# Patient Record
Sex: Female | Born: 1950 | Race: White | Hispanic: No | Smoking: Never smoker
Health system: Southern US, Community
[De-identification: ages and names within clinical notes are randomized; demographics above are authoritative.]

## PROBLEM LIST (undated history)

## (undated) DIAGNOSIS — D696 Thrombocytopenia, unspecified: Secondary | ICD-10-CM

## (undated) DIAGNOSIS — M545 Low back pain, unspecified: Secondary | ICD-10-CM

## (undated) DIAGNOSIS — M1990 Unspecified osteoarthritis, unspecified site: Secondary | ICD-10-CM

## (undated) DIAGNOSIS — Z8489 Family history of other specified conditions: Secondary | ICD-10-CM

## (undated) DIAGNOSIS — N6092 Unspecified benign mammary dysplasia of left breast: Secondary | ICD-10-CM

## (undated) DIAGNOSIS — G8929 Other chronic pain: Secondary | ICD-10-CM

## (undated) DIAGNOSIS — I10 Essential (primary) hypertension: Secondary | ICD-10-CM

## (undated) DIAGNOSIS — K219 Gastro-esophageal reflux disease without esophagitis: Secondary | ICD-10-CM

## (undated) DIAGNOSIS — G43909 Migraine, unspecified, not intractable, without status migrainosus: Secondary | ICD-10-CM

## (undated) DIAGNOSIS — E039 Hypothyroidism, unspecified: Secondary | ICD-10-CM

## (undated) DIAGNOSIS — F329 Major depressive disorder, single episode, unspecified: Secondary | ICD-10-CM

## (undated) DIAGNOSIS — E785 Hyperlipidemia, unspecified: Secondary | ICD-10-CM

## (undated) DIAGNOSIS — F32A Depression, unspecified: Secondary | ICD-10-CM

## (undated) DIAGNOSIS — I2510 Atherosclerotic heart disease of native coronary artery without angina pectoris: Secondary | ICD-10-CM

## (undated) DIAGNOSIS — I219 Acute myocardial infarction, unspecified: Secondary | ICD-10-CM

## (undated) DIAGNOSIS — F419 Anxiety disorder, unspecified: Secondary | ICD-10-CM

## (undated) DIAGNOSIS — H3530 Unspecified macular degeneration: Secondary | ICD-10-CM

## (undated) HISTORY — DX: Morbid (severe) obesity due to excess calories: E66.01

## (undated) HISTORY — DX: Atherosclerotic heart disease of native coronary artery without angina pectoris: I25.10

## (undated) HISTORY — PX: JOINT REPLACEMENT: SHX530

## (undated) HISTORY — PX: CHOLECYSTECTOMY OPEN: SUR202

## (undated) HISTORY — DX: Hyperlipidemia, unspecified: E78.5

## (undated) HISTORY — PX: EYE SURGERY: SHX253

## (undated) HISTORY — DX: Essential (primary) hypertension: I10

## (undated) HISTORY — PX: TONSILLECTOMY: SUR1361

## (undated) HISTORY — PX: BACK SURGERY: SHX140

---

## 1972-09-02 HISTORY — PX: GASTRIC RESTRICTION SURGERY: SHX653

## 2004-07-19 DIAGNOSIS — M1990 Unspecified osteoarthritis, unspecified site: Secondary | ICD-10-CM | POA: Insufficient documentation

## 2004-07-19 DIAGNOSIS — Z780 Asymptomatic menopausal state: Secondary | ICD-10-CM | POA: Insufficient documentation

## 2004-07-19 HISTORY — DX: Unspecified osteoarthritis, unspecified site: M19.90

## 2005-09-02 HISTORY — PX: REPLACEMENT TOTAL KNEE: SUR1224

## 2009-11-29 DIAGNOSIS — R7302 Impaired glucose tolerance (oral): Secondary | ICD-10-CM | POA: Insufficient documentation

## 2009-11-29 DIAGNOSIS — D696 Thrombocytopenia, unspecified: Secondary | ICD-10-CM | POA: Insufficient documentation

## 2009-11-29 DIAGNOSIS — I219 Acute myocardial infarction, unspecified: Secondary | ICD-10-CM | POA: Insufficient documentation

## 2009-11-29 HISTORY — DX: Thrombocytopenia, unspecified: D69.6

## 2010-09-02 DIAGNOSIS — I219 Acute myocardial infarction, unspecified: Secondary | ICD-10-CM

## 2010-09-02 DIAGNOSIS — D696 Thrombocytopenia, unspecified: Secondary | ICD-10-CM

## 2010-09-02 HISTORY — DX: Thrombocytopenia, unspecified: D69.6

## 2010-09-02 HISTORY — DX: Acute myocardial infarction, unspecified: I21.9

## 2011-09-03 HISTORY — PX: CORONARY ANGIOPLASTY WITH STENT PLACEMENT: SHX49

## 2011-09-03 HISTORY — PX: CARDIAC CATHETERIZATION: SHX172

## 2011-12-18 DIAGNOSIS — Z9889 Other specified postprocedural states: Secondary | ICD-10-CM | POA: Insufficient documentation

## 2014-11-23 HISTORY — PX: BREAST EXCISIONAL BIOPSY: SUR124

## 2015-04-18 ENCOUNTER — Telehealth: Payer: Self-pay | Admitting: Cardiovascular Disease

## 2015-04-18 NOTE — Telephone Encounter (Signed)
Received records from [facility] for appointment on 05/10/15 with Dr Oval Linsey.  Records given to [facility] (medical records) for Dr Blenda Mounts schedule on 05/10/15. lp

## 2015-04-25 ENCOUNTER — Other Ambulatory Visit: Payer: Self-pay | Admitting: Family Medicine

## 2015-04-25 DIAGNOSIS — E039 Hypothyroidism, unspecified: Secondary | ICD-10-CM

## 2015-04-27 ENCOUNTER — Ambulatory Visit
Admission: RE | Admit: 2015-04-27 | Discharge: 2015-04-27 | Disposition: A | Discharge status: Home / Self Care | Payer: 59 | Source: Ambulatory Visit | Attending: Family Medicine | Admitting: Family Medicine

## 2015-04-27 DIAGNOSIS — E039 Hypothyroidism, unspecified: Secondary | ICD-10-CM

## 2015-05-02 ENCOUNTER — Ambulatory Visit: Payer: Self-pay

## 2015-05-09 ENCOUNTER — Ambulatory Visit: Payer: Self-pay

## 2015-05-09 NOTE — Progress Notes (Signed)
Cardiology Office Note   Date:  05/10/2015   ID:  [name], DOB 06-Sep-1950, MRN [id number]  PCP:  Lilian Coma, MD  Cardiologist:   Sharol Harness, MD   Chief Complaint  Patient presents with  . New Evaluation    hx MI 2010, STENT 2013, [place] FROM [place] REDDNESS IN FEET AND LEGS ,NO SWELLING  . Shortness of Breath    OFF /ON  SINCE JUNE 2016 ,  . Chest Pain    HAS TO USE NTG - RECENTLY   LAST TIME @3  YEARS AGO, DIAPHORESIS, NO RADIATIONS  . Medical Clearance    POSSIBLE BACK SURGERY WITH DR Vertell Limber      History of Present Illness: Dawn Davidson is a 64 y.o. female with CAD s/p MI, hypertension, hyperlipidemia and morbid obesity who presents for an evaluation of chest pain.  [name] recently moved to [place] from [place]. Prior to moving she be and to develop substernal chest pressure and shortness of breath. After her MI and stent placement in 2010 she had not experienced any chest discomfort. She had not needed any nitroglycerin until 3 weeks ago. She's been having episodes once per week that occur either with exertion or at rest. The episode 4 days ago was 10 out of 10 in severity. She had substernal chest discomfort that did not radiate. It was not associated with nausea, vomiting, or diaphoresis. She took a nitroglycerin and this did help her symptoms. She was recently started on Synthroid and wonders if it could be up side effect of this medication.  [name] does not get any exercise. This is due to chronic back pain. She is currently awaiting surgery. She reports difficulty with her weight despite the fact that she does not eat very much. She frequently goes few days without eating. She underwent stomach stapling at the age of 44, but continues to struggle with her weight.    Past Medical History  Diagnosis Date  . Coronary artery disease     2010 MI  . Thyroid disease 2016  . Hypertension   . Hyperlipidemia   . Morbid obesity 05/10/2015     Past Surgical History  Procedure Laterality Date  . Cardiac catheterization  2013  . Coronary angioplasty  2013  . Replacement total knee Right 2007     Current Outpatient Prescriptions  Medication Sig Dispense Refill  . atorvastatin (LIPITOR) 40 MG tablet Take 40 mg by mouth at bedtime.  3  . cholecalciferol (VITAMIN D) 1000 UNITS tablet Take 1,000 Units by mouth daily.    Marland Kitchen gabapentin (NEURONTIN) 100 MG capsule TAKE 3 CAPSULES BY MOUTH IN AM AND 3 CAPSULES AT BEDTIME  0  . HYDROcodone-acetaminophen (NORCO/VICODIN) 5-325 MG per tablet Take 1-2 tablets by mouth every 6 (six) hours as needed for moderate pain.    Marland Kitchen levothyroxine (SYNTHROID, LEVOTHROID) 50 MCG tablet Take 50 mcg by mouth daily.  2  . losartan (COZAAR) 25 MG tablet Take 25 mg by mouth daily.    . metoprolol tartrate (LOPRESSOR) 25 MG tablet Take 25 mg by mouth 2 (two) times daily.     Marland Kitchen NITROSTAT 0.4 MG SL tablet Place 0.4 mg under the tongue every 5 (five) minutes as needed.     . traMADol (ULTRAM) 50 MG tablet Take 50 mg by mouth every 6 (six) hours as needed. for pain  0  . traZODone (DESYREL) 50 MG tablet Take 50 mg by mouth at bedtime.  0  .  venlafaxine (EFFEXOR) 75 MG tablet Take 60 mg by mouth 2 (two) times daily with a meal.      No current facility-administered medications for this visit.    Allergies:   Sulfa antibiotics    Social History:  The patient  reports that she has never smoked. She does not have any smokeless tobacco history on file. She reports that she does not drink alcohol or use illicit drugs.   Family History:  The patient's family history includes Bipolar disorder in her daughter and daughter; Cancer in her brother; Heart disease in her sister; Stomach cancer in her mother; Thyroid disease in her daughter and grandchild.    ROS:  Please see the history of present illness.   Otherwise, review of systems are positive for varicose veins, spider veins, and back pain.   All other systems are  reviewed and negative.    PHYSICAL EXAM: VS:  BP 142/60 mmHg  Pulse 72  Ht 5\' 3"  (1.6 m)  Wt 121.972 kg (268 lb 14.4 oz)  BMI 47.65 kg/m2 , BMI Body mass index is 47.65 kg/(m^2). GENERAL:  Well appearing.  Obese. HEENT:  Pupils equal round and reactive, fundi not visualized, oral mucosa unremarkable NECK:  No jugular venous distention, waveform within normal limits, carotid upstroke brisk and symmetric, no bruits, no thyromegaly LYMPHATICS:  No cervical adenopathy LUNGS:  Clear to auscultation bilaterally HEART:  RRR.  PMI not displaced or sustained,S1 and S2 within normal limits, no S3, no S4, no clicks, no rubs, no murmurs ABD:  Flat, positive bowel sounds normal in frequency in pitch, no bruits, no rebound, no guarding, no midline pulsatile mass, no hepatomegaly, no splenomegaly EXT:  2 plus pulses throughout, no edema, no cyanosis no clubbing SKIN:  No rashes no nodules NEURO:  Cranial nerves II through XII grossly intact, motor grossly intact throughout PSYCH:  Cognitively intact, oriented to person place and time    EKG:  EKG is ordered today. The ekg ordered today demonstrates sinus rhythm at 72 bpm.     Recent Labs: No results found for requested labs within last 365 days.    Lipid Panel No results found for: CHOL, TRIG, HDL, CHOLHDL, VLDL, LDLCALC, LDLDIRECT    Wt Readings from Last 3 Encounters:  05/10/15 121.972 kg (268 lb 14.4 oz)      Other studies Reviewed: Additional studies/ records that were reviewed today include. Medical record review Review of the above records demonstrates:  Please see elsewhere in the note.     ASSESSMENT AND PLAN:  # CAD, chest pain: [name]'s symptoms are concerning for ischemia.  She was instructed to go to the ED if her CP does not respond to sublingual nitroglycerin. - Lexiscan Myoview - Continue aspirin, metoprolol, and atorvastatin   # Hypertension: BP near goal.  SBP is 142 and would like it to be <140 mmHg.  Will  continue to monitor for now. - Continue metoprolol and losartan  # Hyperlipidemia: Patient states that her PCP recently checked her lipids and they were at goal. - Continue atorvastatin  # Pre-surgical assessment: Given Ms. Collecton's chest pain that is concerning for angina and her inability to complete four METs of activity due to back pain, she should undergo an ischemia evaluation prior to undergoing any elective surgery.  # Morbid obesity: Ms. Latterell was encouraged to increase her physical activity.  She is currently limited due to back pain but looks forward to starting to walk after back surgery.  She also  skips meals frequently.  She was reminded that this slows her metabolism.  Instead she should have several small, nutritious meals daily.  Current medicines are reviewed at length with the patient today.  The patient does not have concerns regarding medicines.  The following changes have been made:  no change  Labs/ tests ordered today include:   Orders Placed This Encounter  Procedures  . Myocardial Perfusion Imaging  . EKG 12-Lead     Disposition:   FU with Dr. Jonelle Sidle C. [place] in 6 months.    Signed, Sharol Harness, MD  05/10/2015 8:52 AM    [facility]

## 2015-05-10 ENCOUNTER — Ambulatory Visit (INDEPENDENT_AMBULATORY_CARE_PROVIDER_SITE_OTHER): Payer: 59 | Admitting: Cardiovascular Disease

## 2015-05-10 ENCOUNTER — Encounter: Payer: Self-pay | Admitting: Cardiovascular Disease

## 2015-05-10 VITALS — BP 142/60 | HR 72 | Ht 63.0 in | Wt 268.9 lb

## 2015-05-10 DIAGNOSIS — I25110 Atherosclerotic heart disease of native coronary artery with unstable angina pectoris: Secondary | ICD-10-CM | POA: Diagnosis not present

## 2015-05-10 DIAGNOSIS — R079 Chest pain, unspecified: Secondary | ICD-10-CM | POA: Diagnosis not present

## 2015-05-10 DIAGNOSIS — R0602 Shortness of breath: Secondary | ICD-10-CM

## 2015-05-10 DIAGNOSIS — I200 Unstable angina: Secondary | ICD-10-CM | POA: Diagnosis not present

## 2015-05-10 DIAGNOSIS — Z01810 Encounter for preprocedural cardiovascular examination: Secondary | ICD-10-CM

## 2015-05-10 HISTORY — DX: Morbid (severe) obesity due to excess calories: E66.01

## 2015-05-10 NOTE — Patient Instructions (Signed)
Your physician has requested that you have a lexiscan myoview. For further information please visit [email or web address]. Please follow instruction sheet, as given.  NO OTHER CHANGES TODAY.  Your physician wants you to follow-up in [place]. You will receive a reminder letter in the mail two months in advance. If you don't receive a letter, please call our office to schedule the follow-up appointment.

## 2015-05-16 ENCOUNTER — Ambulatory Visit: Payer: Self-pay

## 2015-05-25 ENCOUNTER — Telehealth (HOSPITAL_COMMUNITY): Payer: Self-pay | Admitting: Radiology

## 2015-05-25 NOTE — Telephone Encounter (Signed)
Encounter complete. 

## 2015-05-30 ENCOUNTER — Ambulatory Visit (HOSPITAL_COMMUNITY)
Admission: RE | Admit: 2015-05-30 | Discharge: 2015-05-30 | Disposition: A | Discharge status: Home / Self Care | Payer: 59 | Source: Ambulatory Visit | Attending: Internal Medicine | Admitting: Internal Medicine

## 2015-05-30 DIAGNOSIS — I25110 Atherosclerotic heart disease of native coronary artery with unstable angina pectoris: Secondary | ICD-10-CM

## 2015-05-30 DIAGNOSIS — Z8249 Family history of ischemic heart disease and other diseases of the circulatory system: Secondary | ICD-10-CM | POA: Diagnosis not present

## 2015-05-30 DIAGNOSIS — E669 Obesity, unspecified: Secondary | ICD-10-CM | POA: Insufficient documentation

## 2015-05-30 DIAGNOSIS — R079 Chest pain, unspecified: Secondary | ICD-10-CM | POA: Diagnosis not present

## 2015-05-30 DIAGNOSIS — R5383 Other fatigue: Secondary | ICD-10-CM | POA: Insufficient documentation

## 2015-05-30 DIAGNOSIS — R0602 Shortness of breath: Secondary | ICD-10-CM | POA: Diagnosis not present

## 2015-05-30 DIAGNOSIS — Z6841 Body Mass Index (BMI) 40.0 and over, adult: Secondary | ICD-10-CM | POA: Diagnosis not present

## 2015-05-30 DIAGNOSIS — I200 Unstable angina: Secondary | ICD-10-CM | POA: Diagnosis not present

## 2015-05-30 DIAGNOSIS — I10 Essential (primary) hypertension: Secondary | ICD-10-CM | POA: Diagnosis not present

## 2015-05-30 DIAGNOSIS — Z01810 Encounter for preprocedural cardiovascular examination: Secondary | ICD-10-CM

## 2015-05-30 DIAGNOSIS — R0609 Other forms of dyspnea: Secondary | ICD-10-CM | POA: Insufficient documentation

## 2015-05-30 MED ORDER — TECHNETIUM TC 99M SESTAMIBI GENERIC - CARDIOLITE
32.0000 | Freq: Once | INTRAVENOUS | Status: AC | PRN
  Administered 2015-05-30: 32 via INTRAVENOUS

## 2015-05-30 MED ORDER — REGADENOSON 0.4 MG/5ML IV SOLN
0.4000 mg | Freq: Once | INTRAVENOUS | Status: AC
  Administered 2015-05-30: 0.4 mg via INTRAVENOUS

## 2015-05-31 ENCOUNTER — Ambulatory Visit (HOSPITAL_COMMUNITY)
Admission: RE | Admit: 2015-05-31 | Discharge: 2015-05-31 | Disposition: A | Discharge status: Home / Self Care | Payer: 59 | Source: Ambulatory Visit | Attending: Urology | Admitting: Urology

## 2015-05-31 LAB — MYOCARDIAL PERFUSION IMAGING
LV dias vol: 91 mL
LV sys vol: 32 mL
Peak HR: 88 {beats}/min
Rest HR: 65 {beats}/min
SDS: 1
SRS: 0
SSS: 1
TID: 0.75

## 2015-05-31 MED ORDER — TECHNETIUM TC 99M SESTAMIBI GENERIC - CARDIOLITE
30.1000 | Freq: Once | INTRAVENOUS | Status: AC | PRN
  Administered 2015-05-31: 30.1 via INTRAVENOUS

## 2015-06-06 ENCOUNTER — Telehealth: Payer: Self-pay | Admitting: Cardiovascular Disease

## 2015-06-06 NOTE — Telephone Encounter (Signed)
Spoke to patient. Result given . Verbalized understanding Routed report to Dr Vertell Limber PATIENT AWARE

## 2015-06-06 NOTE — Telephone Encounter (Signed)
-----   Message from Skeet Latch, MD sent at 06/04/2015  6:58 PM EDT ----- Normal stress test.  She is at acceptable risk to undergo surgery.  Dawn Davidson should be taking aspirin 81mg  daily in addition to her current regimen.

## 2015-06-06 NOTE — Telephone Encounter (Signed)
Dawn Davidson is calling to get the results of her stress test , please call   Thanks

## 2015-06-20 ENCOUNTER — Other Ambulatory Visit: Payer: Self-pay

## 2015-06-20 DIAGNOSIS — Z1231 Encounter for screening mammogram for malignant neoplasm of breast: Secondary | ICD-10-CM

## 2015-07-04 ENCOUNTER — Ambulatory Visit
Admission: RE | Admit: 2015-07-04 | Discharge: 2015-07-04 | Disposition: A | Discharge status: Home / Self Care | Payer: 59 | Source: Ambulatory Visit

## 2015-07-04 DIAGNOSIS — Z1231 Encounter for screening mammogram for malignant neoplasm of breast: Secondary | ICD-10-CM

## 2015-07-13 ENCOUNTER — Other Ambulatory Visit: Payer: Self-pay | Admitting: Family Medicine

## 2015-07-13 DIAGNOSIS — R928 Other abnormal and inconclusive findings on diagnostic imaging of breast: Secondary | ICD-10-CM

## 2015-07-21 ENCOUNTER — Ambulatory Visit
Admission: RE | Admit: 2015-07-21 | Discharge: 2015-07-21 | Disposition: A | Discharge status: Home / Self Care | Payer: 59 | Source: Ambulatory Visit | Attending: Family Medicine | Admitting: Family Medicine

## 2015-07-21 ENCOUNTER — Other Ambulatory Visit: Payer: Self-pay | Admitting: Neurosurgery

## 2015-07-21 ENCOUNTER — Other Ambulatory Visit: Payer: Self-pay | Admitting: Family Medicine

## 2015-07-21 DIAGNOSIS — R928 Other abnormal and inconclusive findings on diagnostic imaging of breast: Secondary | ICD-10-CM

## 2015-08-03 HISTORY — PX: BREAST BIOPSY: SHX20

## 2015-08-09 ENCOUNTER — Other Ambulatory Visit: Payer: Self-pay | Admitting: Family Medicine

## 2015-08-09 DIAGNOSIS — R928 Other abnormal and inconclusive findings on diagnostic imaging of breast: Secondary | ICD-10-CM

## 2015-08-10 ENCOUNTER — Ambulatory Visit
Admission: RE | Admit: 2015-08-10 | Discharge: 2015-08-10 | Disposition: A | Discharge status: Home / Self Care | Payer: 59 | Source: Ambulatory Visit | Attending: Family Medicine | Admitting: Family Medicine

## 2015-08-10 DIAGNOSIS — R928 Other abnormal and inconclusive findings on diagnostic imaging of breast: Secondary | ICD-10-CM

## 2015-08-14 ENCOUNTER — Encounter (HOSPITAL_COMMUNITY)
Admission: RE | Admit: 2015-08-14 | Discharge: 2015-08-14 | Disposition: A | Discharge status: Home / Self Care | Payer: 59 | Source: Ambulatory Visit | Attending: Neurosurgery | Admitting: Neurosurgery

## 2015-08-14 ENCOUNTER — Encounter (HOSPITAL_COMMUNITY): Payer: Self-pay

## 2015-08-14 ENCOUNTER — Ambulatory Visit (HOSPITAL_COMMUNITY)
Admission: RE | Admit: 2015-08-14 | Discharge: 2015-08-14 | Disposition: A | Discharge status: Home / Self Care | Payer: 59 | Source: Ambulatory Visit | Attending: Neurosurgery | Admitting: Neurosurgery

## 2015-08-14 DIAGNOSIS — Z01811 Encounter for preprocedural respiratory examination: Secondary | ICD-10-CM | POA: Diagnosis present

## 2015-08-14 DIAGNOSIS — Z01812 Encounter for preprocedural laboratory examination: Secondary | ICD-10-CM | POA: Diagnosis not present

## 2015-08-14 HISTORY — DX: Unspecified osteoarthritis, unspecified site: M19.90

## 2015-08-14 HISTORY — DX: Hypothyroidism, unspecified: E03.9

## 2015-08-14 HISTORY — DX: Thrombocytopenia, unspecified: D69.6

## 2015-08-14 HISTORY — DX: Gastro-esophageal reflux disease without esophagitis: K21.9

## 2015-08-14 HISTORY — DX: Family history of other specified conditions: Z84.89

## 2015-08-14 LAB — CBC
HCT: 44.9 % (ref 36.0–46.0)
Hemoglobin: 14.5 g/dL (ref 12.0–15.0)
MCH: 27.6 pg (ref 26.0–34.0)
MCHC: 32.3 g/dL (ref 30.0–36.0)
MCV: 85.5 fL (ref 78.0–100.0)
Platelets: 207 10*3/uL (ref 150–400)
RBC: 5.25 MIL/uL — ABNORMAL HIGH (ref 3.87–5.11)
RDW: 13.2 % (ref 11.5–15.5)
WBC: 9.2 10*3/uL (ref 4.0–10.5)

## 2015-08-14 LAB — BASIC METABOLIC PANEL
Anion gap: 8 (ref 5–15)
BUN: 13 mg/dL (ref 6–20)
CO2: 26 mmol/L (ref 22–32)
Calcium: 9.5 mg/dL (ref 8.9–10.3)
Chloride: 104 mmol/L (ref 101–111)
Creatinine, Ser: 0.95 mg/dL (ref 0.44–1.00)
GFR calc Af Amer: >60 mL/min (ref >60)
GFR calc non Af Amer: >60 mL/min (ref >60)
Glucose, Bld: 118 mg/dL — ABNORMAL HIGH (ref 65–99)
Potassium: 3.8 mmol/L (ref 3.5–5.1)
Sodium: 138 mmol/L (ref 135–145)

## 2015-08-14 LAB — SURGICAL PCR SCREEN
MRSA, PCR: NEGATIVE
Staphylococcus aureus: POSITIVE — AB

## 2015-08-14 LAB — ABO/RH: ABO/RH(D): A POS

## 2015-08-14 LAB — TYPE AND SCREEN
ABO/RH(D): A POS
Antibody Screen: NEGATIVE

## 2015-08-14 NOTE — Progress Notes (Addendum)
Has cardiac history - Mi in 2010, Stent in 2013, moved from [place]. She usually saw the NP @ [facility] in [place], [place] for her cardiac problems.   PCP is [facility].   Heart cath in 2013 was in  [place]  Copy of stent card placed inside chart. Now sees Dr. Skeet Latch, [place] was 05/2015 (note inside chart) Currently denies any heart issues, no sob, chest pain. She has had breast biopsies, one was benign, but she has another cyst that "is in the ducts" and is going back to general surgeon after this to get that taken care of.

## 2015-08-14 NOTE — Pre-Procedure Instructions (Signed)
   Dawn Davidson  08/14/2015      [facility] - [place], [place] - [address] [place] [place] [address] Phone: [phone] Fax: [phone]    Your procedure is scheduled on Dec. 16th, Friday.   Report to [facility] Admitting at 7:15 AM  Call this number if you have problems the morning of surgery:  [phone]   Remember:  Do not eat food or drink liquids after midnight Thursday.  Take these medicines the morning of surgery with A SIP OF WATER : Levothyroxine, Metoprolol, Effexor, Pain medication   Do not wear jewelry, make-up or nail polish.  Do not wear lotions, powders, or perfumes.  You may NOT wear deodorant the day of surgery.  Do not shave underarms & legs 48 hours prior to surgery.     Do not bring valuables to the hospital.  [facility] is not responsible for any belongings or valuables.  Contacts, dentures or bridgework may not be worn into surgery.  Leave your suitcase in the car.  After surgery it may be brought to your room. For patients admitted to the hospital, discharge time will be determined by your treatment team.  Name and phone number of your driver:     Please read over the following fact sheets that you were given. Pain Booklet, Coughing and Deep Breathing, Blood Transfusion Information, MRSA Information and Surgical Site Infection Prevention

## 2015-08-14 NOTE — Progress Notes (Signed)
Mupirocin Ointment Rx called into [facility] on [address] for positive PCR of Staph. Pt notified and voiced understanding.  

## 2015-08-14 NOTE — Progress Notes (Signed)
   08/14/15 1313  OBSTRUCTIVE SLEEP APNEA  Have you ever been diagnosed with sleep apnea through a sleep study? No  Do you snore loudly (loud enough to be heard through closed doors)?  0  Do you often feel tired, fatigued, or sleepy during the daytime (such as falling asleep during driving or talking to someone)? 1 ('always tired")  Has anyone observed you stop breathing during your sleep? 0  Do you have, or are you being treated for high blood pressure? 1  BMI more than 35 kg/m2? 1  Age > 50 (1-yes) 1  Neck circumference greater than:Female 16 inches or larger, Female 17inches or larger? 1  Female Gender (Yes=1) 0  Obstructive Sleep Apnea Score 5

## 2015-08-15 NOTE — Progress Notes (Signed)
Anesthesia Chart Review:  Pt is 64 year old female scheduled for L4-5 maximum access PLIF on 08/18/2015 with Dr. Vertell Limber.   Cardiologist is Dr. Skeet Latch.   PMH includes:  CAD (MI 2010, stent in 2013 in [place]), HTN, hyperlipidemia, hypothyroidism, GERD. Never smoker. BMI 46.5  Medications include: ASA, lipitor, levothyroxine, losartan, metoprolol, prilosec.   Preoperative labs reviewed.    Chest x-ray 08/14/15 reviewed. No acute cardiopulmonary abnormality  EKG 05/10/15: sinus rhythm. Borderline low voltage.   Nuclear stress test 05/31/15:   Nuclear stress EF: 65%.  The left ventricular ejection fraction is normal (55-65%).  No T wave inversion was noted during stress.  There was no ST segment deviation noted during stress.  This is a low risk study.  Pt has cardiac clearance from Dr. Oval Linsey for surgery.   Attempting to get copy of cardiac cath from 2013.  Will revisit chart when records arrive.   Willeen Cass, FNP-BC [facility]/Anesthesiology Phone: [phone] 08/15/2015 12:30 PM

## 2015-08-17 NOTE — Progress Notes (Signed)
Spoke with medical records at [facility] and [facility] in [place] and re-requested cardiac cath results.  They had me re-fax the request and mark it urgent which I did.

## 2015-08-17 NOTE — Progress Notes (Signed)
Spoke with Audry Pili in medical records states he doesn't see a request. Faxed request for card cath to [phone].

## 2015-08-18 ENCOUNTER — Encounter (HOSPITAL_COMMUNITY): Payer: Self-pay

## 2015-08-18 ENCOUNTER — Encounter (HOSPITAL_COMMUNITY): Payer: Self-pay | Admitting: General Practice

## 2015-08-18 ENCOUNTER — Inpatient Hospital Stay (HOSPITAL_COMMUNITY)
Admission: AD | Admit: 2015-08-18 | Discharge: 2015-08-19 | DRG: 459 | Disposition: A | Discharge status: Home / Self Care | Payer: 59 | Source: Ambulatory Visit | Attending: Neurosurgery | Admitting: Neurosurgery

## 2015-08-18 ENCOUNTER — Encounter (HOSPITAL_COMMUNITY)
Admission: AD | Disposition: A | Discharge status: Home / Self Care | Payer: Self-pay | Source: Ambulatory Visit | Attending: Neurosurgery

## 2015-08-18 ENCOUNTER — Inpatient Hospital Stay (HOSPITAL_COMMUNITY): Payer: 59

## 2015-08-18 ENCOUNTER — Inpatient Hospital Stay (HOSPITAL_COMMUNITY): Payer: 59 | Admitting: Certified Registered Nurse Anesthetist

## 2015-08-18 ENCOUNTER — Encounter (HOSPITAL_COMMUNITY): Payer: Self-pay | Admitting: *Deleted

## 2015-08-18 ENCOUNTER — Inpatient Hospital Stay (HOSPITAL_COMMUNITY): Payer: 59 | Admitting: Vascular Surgery

## 2015-08-18 DIAGNOSIS — I2510 Atherosclerotic heart disease of native coronary artery without angina pectoris: Secondary | ICD-10-CM | POA: Diagnosis present

## 2015-08-18 DIAGNOSIS — E039 Hypothyroidism, unspecified: Secondary | ICD-10-CM | POA: Diagnosis present

## 2015-08-18 DIAGNOSIS — Z96659 Presence of unspecified artificial knee joint: Secondary | ICD-10-CM | POA: Diagnosis present

## 2015-08-18 DIAGNOSIS — Z419 Encounter for procedure for purposes other than remedying health state, unspecified: Secondary | ICD-10-CM

## 2015-08-18 DIAGNOSIS — M4806 Spinal stenosis, lumbar region: Principal | ICD-10-CM | POA: Diagnosis present

## 2015-08-18 DIAGNOSIS — I10 Essential (primary) hypertension: Secondary | ICD-10-CM | POA: Diagnosis present

## 2015-08-18 DIAGNOSIS — G9519 Other vascular myelopathies: Secondary | ICD-10-CM | POA: Diagnosis present

## 2015-08-18 DIAGNOSIS — Z955 Presence of coronary angioplasty implant and graft: Secondary | ICD-10-CM | POA: Diagnosis not present

## 2015-08-18 DIAGNOSIS — I252 Old myocardial infarction: Secondary | ICD-10-CM | POA: Diagnosis not present

## 2015-08-18 DIAGNOSIS — M48062 Spinal stenosis, lumbar region with neurogenic claudication: Secondary | ICD-10-CM | POA: Diagnosis present

## 2015-08-18 DIAGNOSIS — M5116 Intervertebral disc disorders with radiculopathy, lumbar region: Secondary | ICD-10-CM | POA: Diagnosis present

## 2015-08-18 DIAGNOSIS — Z6841 Body Mass Index (BMI) 40.0 and over, adult: Secondary | ICD-10-CM | POA: Diagnosis not present

## 2015-08-18 DIAGNOSIS — M2578 Osteophyte, vertebrae: Secondary | ICD-10-CM | POA: Diagnosis present

## 2015-08-18 DIAGNOSIS — Z9884 Bariatric surgery status: Secondary | ICD-10-CM | POA: Diagnosis not present

## 2015-08-18 HISTORY — PX: MAXIMUM ACCESS (MAS)POSTERIOR LUMBAR INTERBODY FUSION (PLIF) 1 LEVEL: SHX6368

## 2015-08-18 HISTORY — DX: Low back pain: M54.5

## 2015-08-18 HISTORY — DX: Acute myocardial infarction, unspecified: I21.9

## 2015-08-18 HISTORY — DX: Depression, unspecified: F32.A

## 2015-08-18 HISTORY — DX: Low back pain, unspecified: M54.50

## 2015-08-18 HISTORY — DX: Migraine, unspecified, not intractable, without status migrainosus: G43.909

## 2015-08-18 HISTORY — DX: Other chronic pain: G89.29

## 2015-08-18 HISTORY — DX: Major depressive disorder, single episode, unspecified: F32.9

## 2015-08-18 HISTORY — DX: Anxiety disorder, unspecified: F41.9

## 2015-08-18 SURGERY — FOR MAXIMUM ACCESS (MAS) POSTERIOR LUMBAR INTERBODY FUSION (PLIF) 1 LEVEL
Anesthesia: General | Site: Back

## 2015-08-18 MED ORDER — 0.9 % SODIUM CHLORIDE (POUR BTL) OPTIME
TOPICAL | Status: DC | PRN
  Administered 2015-08-18: 1000 mL

## 2015-08-18 MED ORDER — PROPOFOL 500 MG/50ML IV EMUL
INTRAVENOUS | Status: DC | PRN
  Administered 2015-08-18: 50 ug/kg/min via INTRAVENOUS

## 2015-08-18 MED ORDER — THROMBIN 20000 UNITS EX SOLR
CUTANEOUS | Status: DC | PRN
  Administered 2015-08-18: 12:00:00 via TOPICAL

## 2015-08-18 MED ORDER — KCL IN DEXTROSE-NACL 20-5-0.45 MEQ/L-%-% IV SOLN: INTRAVENOUS | Status: DC

## 2015-08-18 MED ORDER — HYDROMORPHONE HCL 1 MG/ML IJ SOLN
INTRAMUSCULAR | Status: AC
  Filled 2015-08-18: qty 1

## 2015-08-18 MED ORDER — BUPIVACAINE HCL (PF) 0.5 % IJ SOLN
INTRAMUSCULAR | Status: DC | PRN
  Administered 2015-08-18: 5 mL

## 2015-08-18 MED ORDER — DEXTROSE 5 % IV SOLN
3.0000 g | Freq: Three times a day (TID) | INTRAVENOUS | Status: AC
  Administered 2015-08-19: 3 g via INTRAVENOUS
  Filled 2015-08-18 (×2): qty 3000

## 2015-08-18 MED ORDER — FLEET ENEMA 7-19 GM/118ML RE ENEM: 1.0000 | ENEMA | Freq: Once | RECTAL | Status: DC | PRN

## 2015-08-18 MED ORDER — HYDROCODONE-ACETAMINOPHEN 5-325 MG PO TABS: 1.0000 | ORAL_TABLET | Freq: Four times a day (QID) | ORAL | Status: DC | PRN

## 2015-08-18 MED ORDER — HYDROMORPHONE HCL 1 MG/ML IJ SOLN
0.2500 mg | INTRAMUSCULAR | Status: DC | PRN
  Administered 2015-08-18 (×2): 0.5 mg via INTRAVENOUS

## 2015-08-18 MED ORDER — PROPOFOL 10 MG/ML IV BOLUS
INTRAVENOUS | Status: DC | PRN
Start: 2015-08-18 — End: 2015-08-18
  Administered 2015-08-18: 180 mg via INTRAVENOUS
  Administered 2015-08-18: 20 mg via INTRAVENOUS

## 2015-08-18 MED ORDER — FENTANYL CITRATE (PF) 250 MCG/5ML IJ SOLN
INTRAMUSCULAR | Status: AC
  Filled 2015-08-18: qty 5

## 2015-08-18 MED ORDER — EPHEDRINE SULFATE 50 MG/ML IJ SOLN
INTRAMUSCULAR | Status: DC | PRN
  Administered 2015-08-18: 10 mg via INTRAVENOUS
  Administered 2015-08-18 (×3): 5 mg via INTRAVENOUS

## 2015-08-18 MED ORDER — LACTATED RINGERS IV SOLN
INTRAVENOUS | Status: DC
  Administered 2015-08-18 (×2): via INTRAVENOUS

## 2015-08-18 MED ORDER — LABETALOL HCL 5 MG/ML IV SOLN
INTRAVENOUS | Status: DC | PRN
  Administered 2015-08-18 (×2): 2.5 mg via INTRAVENOUS
  Administered 2015-08-18: 5 mg via INTRAVENOUS

## 2015-08-18 MED ORDER — FENTANYL CITRATE (PF) 100 MCG/2ML IJ SOLN
INTRAMUSCULAR | Status: DC | PRN
  Administered 2015-08-18 (×10): 50 ug via INTRAVENOUS

## 2015-08-18 MED ORDER — PHENOL 1.4 % MT LIQD: 1.0000 | OROMUCOSAL | Status: DC | PRN

## 2015-08-18 MED ORDER — PROPOFOL 10 MG/ML IV BOLUS
INTRAVENOUS | Status: AC
  Filled 2015-08-18: qty 40

## 2015-08-18 MED ORDER — LEVOTHYROXINE SODIUM 50 MCG PO TABS
50.0000 ug | ORAL_TABLET | Freq: Every day | ORAL | Status: DC
  Administered 2015-08-19: 50 ug via ORAL
  Filled 2015-08-18: qty 1

## 2015-08-18 MED ORDER — ALUM & MAG HYDROXIDE-SIMETH 200-200-20 MG/5ML PO SUSP: 30.0000 mL | Freq: Four times a day (QID) | ORAL | Status: DC | PRN

## 2015-08-18 MED ORDER — SODIUM CHLORIDE 0.9 % IJ SOLN: 3.0000 mL | INTRAMUSCULAR | Status: DC | PRN

## 2015-08-18 MED ORDER — LIDOCAINE-EPINEPHRINE 1 %-1:100000 IJ SOLN
INTRAMUSCULAR | Status: DC | PRN
Start: 2015-08-18 — End: 2015-08-18
  Administered 2015-08-18: 5 mL

## 2015-08-18 MED ORDER — HYDROCODONE-ACETAMINOPHEN 5-325 MG PO TABS
1.0000 | ORAL_TABLET | ORAL | Status: DC | PRN
  Administered 2015-08-18 – 2015-08-19 (×4): 2 via ORAL
  Filled 2015-08-18 (×4): qty 2

## 2015-08-18 MED ORDER — SODIUM CHLORIDE 0.9 % IV SOLN: 250.0000 mL | INTRAVENOUS | Status: DC

## 2015-08-18 MED ORDER — PHENYLEPHRINE HCL 10 MG/ML IJ SOLN
INTRAMUSCULAR | Status: DC | PRN
  Administered 2015-08-18 (×4): 40 ug via INTRAVENOUS

## 2015-08-18 MED ORDER — MIDAZOLAM HCL 2 MG/2ML IJ SOLN
INTRAMUSCULAR | Status: AC
  Filled 2015-08-18: qty 2

## 2015-08-18 MED ORDER — NITROGLYCERIN 0.4 MG SL SUBL: 0.4000 mg | SUBLINGUAL_TABLET | SUBLINGUAL | Status: DC | PRN

## 2015-08-18 MED ORDER — OXYCODONE-ACETAMINOPHEN 5-325 MG PO TABS: 1.0000 | ORAL_TABLET | ORAL | Status: DC | PRN

## 2015-08-18 MED ORDER — BUPIVACAINE LIPOSOME 1.3 % IJ SUSP
20.0000 mL | INTRAMUSCULAR | Status: DC
  Filled 2015-08-18 (×2): qty 20

## 2015-08-18 MED ORDER — VITAMIN D 1000 UNITS PO TABS
1000.0000 [IU] | ORAL_TABLET | Freq: Every day | ORAL | Status: DC
  Administered 2015-08-19: 1000 [IU] via ORAL
  Filled 2015-08-18: qty 1

## 2015-08-18 MED ORDER — ACETAMINOPHEN 650 MG RE SUPP: 650.0000 mg | RECTAL | Status: DC | PRN

## 2015-08-18 MED ORDER — SUCCINYLCHOLINE CHLORIDE 20 MG/ML IJ SOLN
INTRAMUSCULAR | Status: DC | PRN
  Administered 2015-08-18: 120 mg via INTRAVENOUS

## 2015-08-18 MED ORDER — TIZANIDINE HCL 4 MG PO TABS
4.0000 mg | ORAL_TABLET | Freq: Two times a day (BID) | ORAL | Status: DC
  Administered 2015-08-18 – 2015-08-19 (×2): 4 mg via ORAL
  Filled 2015-08-18 (×3): qty 1

## 2015-08-18 MED ORDER — ACETAMINOPHEN 325 MG PO TABS: 650.0000 mg | ORAL_TABLET | ORAL | Status: DC | PRN

## 2015-08-18 MED ORDER — ESMOLOL HCL 100 MG/10ML IV SOLN
INTRAVENOUS | Status: AC
  Filled 2015-08-18: qty 10

## 2015-08-18 MED ORDER — METHOCARBAMOL 500 MG PO TABS
500.0000 mg | ORAL_TABLET | Freq: Four times a day (QID) | ORAL | Status: DC | PRN
  Administered 2015-08-18 – 2015-08-19 (×2): 500 mg via ORAL
  Filled 2015-08-18 (×3): qty 1

## 2015-08-18 MED ORDER — PROPOFOL 10 MG/ML IV BOLUS
INTRAVENOUS | Status: AC
  Filled 2015-08-18: qty 20

## 2015-08-18 MED ORDER — METHOCARBAMOL 1000 MG/10ML IJ SOLN
500.0000 mg | Freq: Four times a day (QID) | INTRAVENOUS | Status: DC | PRN
  Filled 2015-08-18: qty 5

## 2015-08-18 MED ORDER — ONDANSETRON HCL 4 MG/2ML IJ SOLN: 4.0000 mg | INTRAMUSCULAR | Status: DC | PRN

## 2015-08-18 MED ORDER — BISACODYL 10 MG RE SUPP: 10.0000 mg | Freq: Every day | RECTAL | Status: DC | PRN

## 2015-08-18 MED ORDER — ONDANSETRON HCL 4 MG/2ML IJ SOLN
INTRAMUSCULAR | Status: DC | PRN
Start: 2015-08-18 — End: 2015-08-18
  Administered 2015-08-18: 4 mg via INTRAVENOUS

## 2015-08-18 MED ORDER — PANTOPRAZOLE SODIUM 40 MG IV SOLR
40.0000 mg | Freq: Every day | INTRAVENOUS | Status: DC
Start: 2015-08-18 — End: 2015-08-19
  Administered 2015-08-18: 40 mg via INTRAVENOUS
  Filled 2015-08-18: qty 40

## 2015-08-18 MED ORDER — MENTHOL 3 MG MT LOZG: 1.0000 | LOZENGE | OROMUCOSAL | Status: DC | PRN

## 2015-08-18 MED ORDER — MIDAZOLAM HCL 5 MG/5ML IJ SOLN
INTRAMUSCULAR | Status: DC | PRN
  Administered 2015-08-18: 1 mg via INTRAVENOUS

## 2015-08-18 MED ORDER — ATORVASTATIN CALCIUM 40 MG PO TABS
40.0000 mg | ORAL_TABLET | Freq: Every day | ORAL | Status: DC
  Administered 2015-08-18: 40 mg via ORAL
  Filled 2015-08-18: qty 1

## 2015-08-18 MED ORDER — METOPROLOL TARTRATE 25 MG PO TABS
25.0000 mg | ORAL_TABLET | Freq: Two times a day (BID) | ORAL | Status: DC
  Administered 2015-08-18: 25 mg via ORAL
  Filled 2015-08-18 (×2): qty 1

## 2015-08-18 MED ORDER — DOCUSATE SODIUM 100 MG PO CAPS
100.0000 mg | ORAL_CAPSULE | Freq: Two times a day (BID) | ORAL | Status: DC
  Administered 2015-08-18 – 2015-08-19 (×2): 100 mg via ORAL
  Filled 2015-08-18 (×2): qty 1

## 2015-08-18 MED ORDER — LOSARTAN POTASSIUM 50 MG PO TABS
50.0000 mg | ORAL_TABLET | Freq: Every day | ORAL | Status: DC
  Filled 2015-08-18: qty 1

## 2015-08-18 MED ORDER — PROMETHAZINE HCL 25 MG/ML IJ SOLN: 6.2500 mg | INTRAMUSCULAR | Status: DC | PRN

## 2015-08-18 MED ORDER — PANTOPRAZOLE SODIUM 40 MG PO TBEC: 40.0000 mg | DELAYED_RELEASE_TABLET | Freq: Every day | ORAL | Status: DC

## 2015-08-18 MED ORDER — LIDOCAINE HCL (CARDIAC) 20 MG/ML IV SOLN
INTRAVENOUS | Status: DC | PRN
  Administered 2015-08-18: 80 mg via INTRAVENOUS

## 2015-08-18 MED ORDER — LABETALOL HCL 5 MG/ML IV SOLN
INTRAVENOUS | Status: AC
  Filled 2015-08-18: qty 4

## 2015-08-18 MED ORDER — DEXAMETHASONE SODIUM PHOSPHATE 10 MG/ML IJ SOLN
INTRAMUSCULAR | Status: DC | PRN
  Administered 2015-08-18: 10 mg via INTRAVENOUS

## 2015-08-18 MED ORDER — HYDROMORPHONE HCL 1 MG/ML IJ SOLN: 0.5000 mg | INTRAMUSCULAR | Status: DC | PRN

## 2015-08-18 MED ORDER — VENLAFAXINE HCL 37.5 MG PO TABS
75.0000 mg | ORAL_TABLET | Freq: Two times a day (BID) | ORAL | Status: DC
  Administered 2015-08-18 – 2015-08-19 (×2): 75 mg via ORAL
  Filled 2015-08-18 (×2): qty 2

## 2015-08-18 MED ORDER — POLYETHYLENE GLYCOL 3350 17 G PO PACK: 17.0000 g | PACK | Freq: Every day | ORAL | Status: DC | PRN

## 2015-08-18 MED ORDER — ACETAMINOPHEN 500 MG PO TABS: 1000.0000 mg | ORAL_TABLET | Freq: Four times a day (QID) | ORAL | Status: DC | PRN

## 2015-08-18 MED ORDER — CEFAZOLIN SODIUM-DEXTROSE 2-3 GM-% IV SOLR
2.0000 g | INTRAVENOUS | Status: AC
  Administered 2015-08-18: 1 g via INTRAVENOUS
  Administered 2015-08-18: 2 g via INTRAVENOUS
  Filled 2015-08-18: qty 50

## 2015-08-18 MED ORDER — TRAZODONE HCL 50 MG PO TABS
50.0000 mg | ORAL_TABLET | Freq: Every day | ORAL | Status: DC
  Administered 2015-08-18: 50 mg via ORAL
  Filled 2015-08-18: qty 1

## 2015-08-18 MED ORDER — SODIUM CHLORIDE 0.9 % IJ SOLN
3.0000 mL | Freq: Two times a day (BID) | INTRAMUSCULAR | Status: DC
  Administered 2015-08-18: 3 mL via INTRAVENOUS

## 2015-08-18 MED ORDER — PHENYLEPHRINE HCL 10 MG/ML IJ SOLN
10.0000 mg | INTRAVENOUS | Status: DC | PRN
  Administered 2015-08-18: 20 ug/min via INTRAVENOUS

## 2015-08-18 SURGICAL SUPPLY — 85 items
BENZOIN TINCTURE PRP APPL 2/3 (GAUZE/BANDAGES/DRESSINGS) IMPLANT
BIT DRILL PLIF MAS DISP 5.5MM (DRILL) ×1 IMPLANT
BLADE CLIPPER SURG (BLADE) IMPLANT
BONE CANC CHIPS 20CC PCAN1/4 (Bone Implant) ×2 IMPLANT
BONE MATRIX OSTEOCEL PRO SM (Bone Implant) ×2 IMPLANT
BUR MATCHSTICK NEURO 3.0 LAGG (BURR) ×2 IMPLANT
BUR ROUND FLUTED 5 RND (BURR) ×2 IMPLANT
CAGE COROENT MP 8X9X23M-8 SPIN (Cage) ×4 IMPLANT
CANISTER SUCT 3000ML PPV (MISCELLANEOUS) ×2 IMPLANT
CHIPS CANC BONE 20CC PCAN1/4 (Bone Implant) ×1 IMPLANT
CLIP NEUROVISION LG (CLIP) ×2 IMPLANT
CONT SPEC 4OZ CLIKSEAL STRL BL (MISCELLANEOUS) ×2 IMPLANT
COVER BACK TABLE 24X17X13 BIG (DRAPES) IMPLANT
COVER BACK TABLE 60X90IN (DRAPES) ×2 IMPLANT
DECANTER SPIKE VIAL GLASS SM (MISCELLANEOUS) ×2 IMPLANT
DERMABOND ADVANCED (GAUZE/BANDAGES/DRESSINGS) ×1
DERMABOND ADVANCED .7 DNX12 (GAUZE/BANDAGES/DRESSINGS) ×1 IMPLANT
DRAPE C-ARM 42X72 X-RAY (DRAPES) ×2 IMPLANT
DRAPE C-ARMOR (DRAPES) ×2 IMPLANT
DRAPE LAPAROTOMY 100X72X124 (DRAPES) ×2 IMPLANT
DRAPE POUCH INSTRU U-SHP 10X18 (DRAPES) ×2 IMPLANT
DRAPE SURG 17X23 STRL (DRAPES) ×2 IMPLANT
DRILL PLIF MAS DISP 5.5MM (DRILL) ×2
DRSG OPSITE POSTOP 4X6 (GAUZE/BANDAGES/DRESSINGS) ×2 IMPLANT
DURAPREP 26ML APPLICATOR (WOUND CARE) ×2 IMPLANT
ELECT REM PT RETURN 9FT ADLT (ELECTROSURGICAL) ×2
ELECTRODE REM PT RTRN 9FT ADLT (ELECTROSURGICAL) ×1 IMPLANT
EVACUATOR 1/8 PVC DRAIN (DRAIN) IMPLANT
GAUZE SPONGE 4X4 12PLY STRL (GAUZE/BANDAGES/DRESSINGS) IMPLANT
GAUZE SPONGE 4X4 16PLY XRAY LF (GAUZE/BANDAGES/DRESSINGS) IMPLANT
GLOVE BIO SURGEON STRL SZ8 (GLOVE) ×4 IMPLANT
GLOVE BIOGEL PI IND STRL 7.5 (GLOVE) ×1 IMPLANT
GLOVE BIOGEL PI IND STRL 8 (GLOVE) ×4 IMPLANT
GLOVE BIOGEL PI IND STRL 8.5 (GLOVE) ×2 IMPLANT
GLOVE BIOGEL PI INDICATOR 7.5 (GLOVE) ×1
GLOVE BIOGEL PI INDICATOR 8 (GLOVE) ×4
GLOVE BIOGEL PI INDICATOR 8.5 (GLOVE) ×2
GLOVE ECLIPSE 6.5 STRL STRAW (GLOVE) ×2 IMPLANT
GLOVE ECLIPSE 7.5 STRL STRAW (GLOVE) ×10 IMPLANT
GLOVE ECLIPSE 8.0 STRL XLNG CF (GLOVE) ×4 IMPLANT
GLOVE EXAM NITRILE LRG STRL (GLOVE) IMPLANT
GLOVE EXAM NITRILE MD LF STRL (GLOVE) IMPLANT
GLOVE EXAM NITRILE XL STR (GLOVE) IMPLANT
GLOVE EXAM NITRILE XS STR PU (GLOVE) IMPLANT
GOWN STRL REUS W/ TWL LRG LVL3 (GOWN DISPOSABLE) ×1 IMPLANT
GOWN STRL REUS W/ TWL XL LVL3 (GOWN DISPOSABLE) ×2 IMPLANT
GOWN STRL REUS W/TWL 2XL LVL3 (GOWN DISPOSABLE) ×6 IMPLANT
GOWN STRL REUS W/TWL LRG LVL3 (GOWN DISPOSABLE) ×1
GOWN STRL REUS W/TWL XL LVL3 (GOWN DISPOSABLE) ×2
KIT BASIN OR (CUSTOM PROCEDURE TRAY) ×2 IMPLANT
KIT POSITION SURG JACKSON T1 (MISCELLANEOUS) ×2 IMPLANT
KIT ROOM TURNOVER OR (KITS) ×2 IMPLANT
MILL MEDIUM DISP (BLADE) ×2 IMPLANT
MODULE NVM5 NEXT GEN EMG (NEEDLE) ×2 IMPLANT
NEEDLE HYPO 21X1.5 SAFETY (NEEDLE) ×2 IMPLANT
NEEDLE HYPO 25X1 1.5 SAFETY (NEEDLE) ×2 IMPLANT
NEEDLE SPNL 18GX3.5 QUINCKE PK (NEEDLE) ×2 IMPLANT
NS IRRIG 1000ML POUR BTL (IV SOLUTION) ×2 IMPLANT
PACK LAMINECTOMY NEURO (CUSTOM PROCEDURE TRAY) ×2 IMPLANT
PAD ARMBOARD 7.5X6 YLW CONV (MISCELLANEOUS) ×6 IMPLANT
PATTIES SURGICAL .5 X.5 (GAUZE/BANDAGES/DRESSINGS) IMPLANT
PATTIES SURGICAL .5 X1 (DISPOSABLE) IMPLANT
PATTIES SURGICAL 1X1 (DISPOSABLE) IMPLANT
ROD 35MM (Rod) ×2 IMPLANT
ROD 5.5X40MM (Rod) ×2 IMPLANT
SCREW LOCK (Screw) ×4 IMPLANT
SCREW LOCK FXNS SPNE MAS PL (Screw) ×4 IMPLANT
SCREW PLIF MAS 5.5X35 LUMBAR (Screw) ×4 IMPLANT
SCREW SHANKS 5.5X35 (Screw) ×4 IMPLANT
SCREW TULIP 5.5 (Screw) ×4 IMPLANT
SPONGE LAP 4X18 X RAY DECT (DISPOSABLE) IMPLANT
SPONGE SURGIFOAM ABS GEL 100 (HEMOSTASIS) ×2 IMPLANT
STAPLER SKIN PROX WIDE 3.9 (STAPLE) IMPLANT
STRIP CLOSURE SKIN 1/2X4 (GAUZE/BANDAGES/DRESSINGS) IMPLANT
SUT VIC AB 1 CT1 18XBRD ANBCTR (SUTURE) ×2 IMPLANT
SUT VIC AB 1 CT1 8-18 (SUTURE) ×2
SUT VIC AB 2-0 CT1 18 (SUTURE) ×4 IMPLANT
SUT VIC AB 3-0 SH 8-18 (SUTURE) ×4 IMPLANT
SYR 20CC LL (SYRINGE) ×2 IMPLANT
SYR 5ML LL (SYRINGE) IMPLANT
TOWEL OR 17X24 6PK STRL BLUE (TOWEL DISPOSABLE) ×2 IMPLANT
TOWEL OR 17X26 10 PK STRL BLUE (TOWEL DISPOSABLE) ×2 IMPLANT
TRAP SPECIMEN MUCOUS 40CC (MISCELLANEOUS) ×2 IMPLANT
TRAY FOLEY W/METER SILVER 14FR (SET/KITS/TRAYS/PACK) ×2 IMPLANT
WATER STERILE IRR 1000ML POUR (IV SOLUTION) ×2 IMPLANT

## 2015-08-18 NOTE — Anesthesia Postprocedure Evaluation (Signed)
Anesthesia Post Note  Patient: Dawn Davidson  Procedure(s) Performed: Procedure(s) (LRB): Lumbar four-five Maximum access posterior lumbar interbody fusion (N/A)  Patient location during evaluation: PACU Anesthesia Type: General Level of consciousness: awake and oriented Pain management: pain level controlled Vital Signs Assessment: post-procedure vital signs reviewed and stable Respiratory status: spontaneous breathing, patient connected to nasal cannula oxygen and respiratory function stable Cardiovascular status: stable Anesthetic complications: no    Last Vitals:  Filed Vitals:   08/18/15 1448 08/18/15 1453  BP:  139/50  Pulse: 101 104  Temp:    Resp: 14 14    Last Pain:  Filed Vitals:   08/18/15 1455  PainSc: Asleep                 Mida Cory,JAMES TERRILL

## 2015-08-18 NOTE — Interval H&P Note (Signed)
History and Physical Interval Note:  08/18/2015 7:24 AM  Dawn Davidson  has presented today for surgery, with the diagnosis of Spinal stenosis of the lumbar region  The various methods of treatment have been discussed with the patient and family. After consideration of risks, benefits and other options for treatment, the patient has consented to  Procedure(s) with comments: L4-5 Maximum access posterior lumbar interbody fusion (N/A) - L4-5 Maximum access posterior lumbar interbody fusion as a surgical intervention .  The patient's history has been reviewed, patient examined, no change in status, stable for surgery.  I have reviewed the patient's chart and labs.  Questions were answered to the patient's satisfaction.     Maykel Reitter D

## 2015-08-18 NOTE — Brief Op Note (Signed)
08/18/2015  1:36 PM  PATIENT:  Dawn Davidson  64 y.o. 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 disc, spondylosis, radiculopathy L 45, morbid obesity   PROCEDURE:  Procedure(s) with comments: Lumbar four-five Maximum access posterior lumbar interbody fusion (N/A) - L4-5 Maximum access posterior lumbar interbody fusion   Decompression greater than for standard PLIF procedure  SURGEON:  Surgeon(s) and Role:    * Erline Levine, MD - Primary    * Kevan Ny Ditty, MD - Assisting  PHYSICIAN ASSISTANT:   ASSISTANTS: [place], RN   ANESTHESIA:   general  EBL:  Total I/O In: 1760 [I.V.:1760] Out: 450 [Urine:150; Blood:300]  BLOOD ADMINISTERED:none  DRAINS: none   LOCAL MEDICATIONS USED:  MARCAINE    and LIDOCAINE   SPECIMEN:  No Specimen  DISPOSITION OF SPECIMEN:  N/A  COUNTS:  YES  TOURNIQUET:  * No tourniquets in log *  DICTATION: Patient is a 64 year old with spondylosis , stenosis,  disc herniation and severe back and right lower extremity pain at L4/5 levels of the lumbar spine. It was elected to take her to surgery for MASPLIF L 45 level with posterolateral arthrodesis.  She has a large laterally based osteophyte with foraminal and extra-foraminal L 4 nerve root compression on the right.  Procedure:   Following uncomplicated induction of GETA, and placement of electrodes for neural monitoring, patient was turned into a prone position on the [place] tableand using AP  fluoroscopy the area of planned incision was marked, prepped with betadine scrub and Duraprep, then draped. The patient is morbidly obese.  Soft tissue and bony prominences were padded as carefully as possible.  Exposure was performed of facet joint complex at L 45 level and the MAS retractor was placed.5.5 x 35 mm cortical  Nuvasive screws were placed at L 4 bilaterally according to standard landmarks using neural monitoring.  A total laminectomy of L 4 was then performed with disarticulation of facets.  Decompression was performed under Loupe magnification and all neural elements (bilateral L 4, L 5 nerve roots and thecal sac) were painstakingly decompressed of dense investing soft tissue and overgrown osteophytes.  Decompression was greater than for standard PLIF procedure.  This bone was saved for grafting, combined with Osteocel after being run through bone mill and was placed in bone packing device.  Thorough discectomy was performed bilaterally at L 45 and the endplates were prepared for grafting.  23 x 8 x 8 degree cages were placed in the interspace and positioning was confirmed with AP and lateral fluoroscopy.  10 cc of autograft/Osteocel was packed in the interspace medial to the second cage.   Remaining screws were placed at L 5 and  rods were placed.   And the screws were locked and torqued.Final Xrays showed well positioned implants and screw fixation. The posterolateral region was packed with remaining 10 cc of autograft on each side of midline. The wounds were irrigated and then closed with 1, 2-0 and 3-0 Vicryl stitches. Long-acting Marcaine was injected in the patient's subcutaneous tissues. Sterile occlusive dressing was placed with Dermabond. The patient was then extubated in the operating room and taken to recovery in stable and satisfactory condition having tolerated her operation well. Counts were correct at the end of the case.  PLAN OF CARE: Admit to inpatient   PATIENT DISPOSITION:  PACU - hemodynamically stable.  Delay start of Pharmacological VTE agent (>24hrs) due to surgical blood loss or risk of bleeding: yes

## 2015-08-18 NOTE — Transfer of Care (Signed)
Immediate Anesthesia Transfer of Care Note  Patient: Dawn Davidson  Procedure(s) Performed: Procedure(s) with comments: Lumbar four-five Maximum access posterior lumbar interbody fusion (N/A) - L4-5 Maximum access posterior lumbar interbody fusion  Patient Location: PACU  Anesthesia Type:General  Level of Consciousness: awake, alert  and confused  Airway & Oxygen Therapy: Patient Spontanous Breathing and Patient connected to nasal cannula oxygen  Post-op Assessment: Report given to RN and Post -op Vital signs reviewed and stable  Post vital signs: Reviewed and stable  Last Vitals:  Filed Vitals:   08/18/15 0702  BP: 150/49  Pulse: 66  Temp: 37 C  Resp: 16    Complications: No apparent anesthesia complications

## 2015-08-18 NOTE — Progress Notes (Signed)
Pt arrived to 5M14@ 1635, Pt A&Ox 4, c/o pain 5/10. Dressing CDI, no drains. Pt VS taken.  Fluids runningat 75 cc/hr. Foley intact, unclamped. Pt without distress. Family at the bedside. Diet ordered, will monitor.

## 2015-08-18 NOTE — Anesthesia Preprocedure Evaluation (Addendum)
Anesthesia Evaluation  Patient identified by MRN, date of birth, ID band Patient awake    Reviewed: Allergy & Precautions, NPO status , Patient's Chart, lab work & pertinent test results  History of Anesthesia Complications Negative for: history of anesthetic complications  Airway Mallampati: II  TM Distance: >3 FB Neck ROM: Full    Dental  (+) Teeth Intact, Dental Advisory Given, Edentulous Upper   Pulmonary neg pulmonary ROS,    breath sounds clear to auscultation       Cardiovascular hypertension, + CAD and + Cardiac Stents   Rhythm:Regular Rate:Normal     Neuro/Psych negative neurological ROS     GI/Hepatic GERD  ,  Endo/Other  Hypothyroidism Morbid obesity  Renal/GU      Musculoskeletal  (+) Arthritis ,   Abdominal   Peds  Hematology   Anesthesia Other Findings   Reproductive/Obstetrics                           Anesthesia Physical Anesthesia Plan  ASA: III  Anesthesia Plan: General   Post-op Pain Management:    Induction: Intravenous  Airway Management Planned: Oral ETT  Additional Equipment:   Intra-op Plan:   Post-operative Plan: Extubation in OR  Informed Consent: I have reviewed the patients History and Physical, chart, labs and discussed the procedure including the risks, benefits and alternatives for the proposed anesthesia with the patient or authorized representative who has indicated his/her understanding and acceptance.   Dental advisory given  Plan Discussed with: CRNA and Surgeon  Anesthesia Plan Comments:         Anesthesia Quick Evaluation

## 2015-08-18 NOTE — H&P (Signed)
Patient ID:   [id number] Patient: Dawn Davidson  Date of Birth: 1951/02/03 Visit Type: Office Visit   Date: 07/19/2015 01:00 PM Provider: Marchia Meiers. Vertell Limber MD   This 64 year old female presents for back pain and Leg pain.  History of Present Illness: 1.  back pain  2.  Leg pain  Dawn Davidson, 64 year old retired female visits for evaluation of back pain and right leg pain.  Patient reports onset 2015 without recollection of injury.  ESI 01/10/15 yielded only one weeks relief  Aleve two per day Tramadol 50 mg two per day Gabapentin 100 mg 3 times a day Norco 5/325 3 times a day  History: CAD, MI 2010, hypothyroidism Surgical history: Stent 2010, right TKR 2012, cholecystectomy 2002, stomach stapled age 64  MRI and x-rays on [place]  The patient is undergone prior stomach stapling and was in fact one of the earliest patients in [place] to undergo this surgery, but despite this she currently is 5 feet 3 inches tall and 267 pounds.  The patient complains of back and right leg pain.  She has right foraminal stenosis at the L4 L5 level with a large osteophyte to the right which appears to be causing both L4 and L5 nerve root compression.  She is not able to get relief and complains of considerable pain and discomfort.  She limps when she walks.       PAST MEDICAL HISTORY, SURGICAL HISTORY, FAMILY HISTORY, SOCIAL HISTORY AND REVIEW OF SYSTEMS I have reviewed the patient's past medical, surgical, family and social history as well as the comprehensive review of systems as included on the [facility] history form dated 07/19/2015, which I have signed.   MEDICATIONS(added, continued or stopped this visit): Started Medication Directions Instruction Stopped  07/19/2015 Percocet 10 mg-325 mg tablet take 1/2-1 tablet by oral route  every 6 hours as needed       ALLERGIES:   REVIEW OF SYSTEMS System Neg/Pos Details  Constitutional Negative Chills,  fatigue, fever, malaise, night sweats, weight gain and weight loss.  ENMT Negative Ear drainage, hearing loss, nasal drainage, otalgia, sinus pressure and sore throat.  Eyes Negative Eye discharge, eye pain and vision changes.  Respiratory Negative Chronic cough, cough, dyspnea, known TB exposure and wheezing.  Cardio Negative Chest pain, claudication, edema and irregular heartbeat/palpitations.  GI Negative Abdominal pain, blood in stool, change in stool pattern, constipation, decreased appetite, diarrhea, heartburn, nausea and vomiting.  GU Negative Dysuria, hematuria, hot flashes, irregular menses, polyuria, urinary frequency, urinary incontinence and urinary retention.  Endocrine Negative Cold intolerance, heat intolerance, polydipsia and polyphagia.  Neuro Negative Dizziness, extremity weakness, gait disturbance, headache, memory impairment, numbness in extremity, seizures and tremors.  Psych Negative Anxiety, depression and insomnia.  Integumentary Negative Brittle hair, brittle nails, change in shape/size of mole(s), hair loss, hirsutism, hives, pruritus, rash and skin lesion.  MS Positive Back pain.  Hema/Lymph Negative Easy bleeding, easy bruising and lymphadenopathy.  Allergic/Immuno Negative Contact allergy, environmental allergies, food allergies and seasonal allergies.  Reproductive Negative Breast discharge, breast lump(s), dysmenorrhea, dyspareunia, history of abnormal PAP smear and vaginal discharge.     Vitals Date Temp F BP Pulse Ht In Wt Lb BMI BSA Pain Score  07/19/2015  145/79 71 63 267 47.3  10/10     PHYSICAL EXAM General Level of Distress: no acute distress Overall Appearance: obese  Head and Face  Right Left  Fundoscopic Exam:  normal normal    Cardiovascular Cardiac: regular rate  and rhythm without murmur  Right Left  Carotid Pulses: normal normal  Respiratory Lungs: clear to auscultation  Neurological Orientation: normal Recent and Remote  Memory: normal Attention Span and Concentration:   normal Language: normal Fund of Knowledge: normal  Right Left Sensation: normal normal Upper Extremity Coordination: normal normal  Lower Extremity Coordination: normal normal  Musculoskeletal Gait and Station: normal  Right Left Upper Extremity Muscle Strength: normal normal Lower Extremity Muscle Strength: normal normal Upper Extremity Muscle Tone:  normal normal Lower Extremity Muscle Tone: normal normal  Motor Strength Upper and lower extremity motor strength was tested in the clinically pertinent muscles. Any abnormal findings will be noted below.   Right Left EHL: 4/5    Deep Tendon Reflexes  Right Left Biceps: normal normal Triceps: normal normal Brachiloradialis: normal normal Patellar: normal normal Achilles: normal normal  Sensory Sensation was tested at L1 to S1.   Cranial Nerves II. Optic Nerve/Visual Fields: normal III. Oculomotor: normal IV. Trochlear: normal V. Trigeminal: normal VI. Abducens: normal VII. Facial: normal VIII. Acoustic/Vestibular: normal IX. Glossopharyngeal: normal X. Vagus: normal XI. Spinal Accessory: normal XII. Hypoglossal: normal  Motor and other Tests Lhermittes: negative Rhomberg: negative Pronator drift: absent     Right Left Hoffman's: normal normal Clonus: normal normal Babinski: normal normal SLR: positive at 40 degrees negative Patrick's Corky Sox): negative negative Toe Walk: normal normal Toe Lift: normal normal Heel Walk: normal normal SI Joint: nontender nontender   Additional Findings:  Patient has right sciatic notch discomfort.  She has decreased ability to squat on the right leg compared to the left.    IMPRESSION The patient is marked disc degeneration and spondylosis with foraminal stenosis and a large lateral osteophyte of the L4 L5 level which appears to be causing compression of the right L4 nerve root extra foraminally.  Because of the  significant structural abnormalities and chronic nature of these degenerative changes, I recommended decompression and fusion operation at the L4-L5 level.  I do not think that a simple laminectomy will resolve her current condition.  Completed Orders (this encounter) Order Details Reason Side Interpretation Result Initial Treatment Date Region  Lumbar Spine- AP/Lat/Obls/Spot/Flex/Ex      07/19/2015 All Levels to All Levels   Assessment/Plan # Detail Type Description   1. Assessment Disc displacement, lumbar (M51.26).       2. Assessment Low back pain, unspecified back pain laterality, with sciatica presence unspecified (M54.5).       3. Assessment Spinal stenosis of lumbar region (M48.06).       4. 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       5. 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       6. Assessment Scoliosis (and kyphoscoliosis), idiopathic (M41.20).       7. Assessment Lumbar radiculopathy (M54.16).         Patient will undergo L4 L5 and the S PLIF procedure.  She was fitted for an LSO brace.  Surgery has been scheduled for 08/17/15.  Patient may require cardiology clearance prior to surgery.  Risks and benefits were discussed in detail with the patient and she wishes to proceed.  Orders: Diagnostic Procedures: Assessment Procedure  M48.06 MAS PLIF - L4-L5  M54.16 Lumbar Spine- AP/Lat  M54.16 Lumbar Spine- AP/Lat/Obls/Spot/Flex/Ex    MEDICATIONS PRESCRIBED TODAY    Rx Quantity Refills  PERCOCET 10 mg-325 mg  80 0            Provider:  Marchia Meiers. Vertell Limber MD  07/22/2015 03:47 PM Dictation edited by: Marchia Meiers. Vertell Limber    CC Providers: [facility] [place] [employer] [place], [place] [address]-              Electronically signed by Marchia Meiers Vertell Limber MD on 07/22/2015 03:48 PM

## 2015-08-18 NOTE — Progress Notes (Signed)
No information received regarding cardiac cath from [facility] in [place], [place]. This nurse called [phone] at this time. Per answering service, will page on call [facility] staff. Obtained release of information from pt and faxed to corrected number [phone] at 0715.

## 2015-08-18 NOTE — Progress Notes (Signed)
Awake, alert, conversant.  MAEW.  Doing well.  

## 2015-08-18 NOTE — Anesthesia Procedure Notes (Signed)
Procedure Name: Intubation Date/Time: 08/18/2015 10:56 AM Performed by: Merdis Delay Pre-anesthesia Checklist: Patient identified, Timeout performed, Emergency Drugs available, Patient being monitored and Suction available Patient Re-evaluated:Patient Re-evaluated prior to inductionOxygen Delivery Method: Circle system utilized Preoxygenation: Pre-oxygenation with 100% oxygen Intubation Type: IV induction Ventilation: Mask ventilation without difficulty Laryngoscope Size: Mac and 3 Grade View: Grade I Tube type: Oral Tube size: 7.0 mm Number of attempts: 1 Airway Equipment and Method: Stylet Placement Confirmation: ETT inserted through vocal cords under direct vision,  breath sounds checked- equal and bilateral,  positive ETCO2 and CO2 detector Secured at: 22 cm Tube secured with: Tape Dental Injury: Teeth and Oropharynx as per pre-operative assessment

## 2015-08-18 NOTE — Op Note (Signed)
08/18/2015  1:36 PM  PATIENT:  Dawn Davidson  64 y.o. 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 disc, spondylosis, radiculopathy L 45, morbid obesity   PROCEDURE:  Procedure(s) with comments: Lumbar four-five Maximum access posterior lumbar interbody fusion (N/A) - L4-5 Maximum access posterior lumbar interbody fusion   Decompression greater than for standard PLIF procedure  SURGEON:  Surgeon(s) and Role:    * Erline Levine, MD - Primary    * Kevan Ny Ditty, MD - Assisting  PHYSICIAN ASSISTANT:   ASSISTANTS: P[place], RN   ANESTHESIA:   general  EBL:  Total I/O In: 1760 [I.V.:1760] Out: 450 [Urine:150; Blood:300]  BLOOD ADMINISTERED:none  DRAINS: none   LOCAL MEDICATIONS USED:  MARCAINE    and LIDOCAINE   SPECIMEN:  No Specimen  DISPOSITION OF SPECIMEN:  N/A  COUNTS:  YES  TOURNIQUET:  * No tourniquets in log *  DICTATION: Patient is a 64 year old with spondylosis , stenosis,  disc herniation and severe back and right lower extremity pain at L4/5 levels of the lumbar spine. It was elected to take her to surgery for MASPLIF L 45 level with posterolateral arthrodesis.  She has a large laterally based osteophyte with foraminal and extra-foraminal L 4 nerve root compression on the right.  Procedure:   Following uncomplicated induction of GETA, and placement of electrodes for neural monitoring, patient was turned into a prone position on the [employer] tableand using AP  fluoroscopy the area of planned incision was marked, prepped with betadine scrub and Duraprep, then draped. The patient is morbidly obese.  Soft tissue and bony prominences were padded as carefully as possible.  Exposure was performed of facet joint complex at L 45 level and the MAS retractor was placed.5.5 x 35 mm cortical  Nuvasive screws were placed at L 4 bilaterally according to standard landmarks using neural monitoring.  A total laminectomy of L 4 was then performed with disarticulation of facets.  Decompression was performed under Loupe magnification and all neural elements (bilateral L 4, L 5 nerve roots and thecal sac) were painstakingly decompressed of dense investing soft tissue and overgrown osteophytes.  Decompression was greater than for standard PLIF procedure.  This bone was saved for grafting, combined with Osteocel after being run through bone mill and was placed in bone packing device.  Thorough discectomy was performed bilaterally at L 45 and the endplates were prepared for grafting.  23 x 8 x 8 degree cages were placed in the interspace and positioning was confirmed with AP and lateral fluoroscopy.  10 cc of autograft/Osteocel was packed in the interspace medial to the second cage.   Remaining screws were placed at L 5 and  rods were placed.   And the screws were locked and torqued.Final Xrays showed well positioned implants and screw fixation. The posterolateral region was packed with remaining 10 cc of autograft on each side of midline. The wounds were irrigated and then closed with 1, 2-0 and 3-0 Vicryl stitches. Long-acting Marcaine was injected in the patient's subcutaneous tissues. Sterile occlusive dressing was placed with Dermabond. The patient was then extubated in the operating room and taken to recovery in stable and satisfactory condition having tolerated her operation well. Counts were correct at the end of the case.  PLAN OF CARE: Admit to inpatient   PATIENT DISPOSITION:  PACU - hemodynamically stable.  Delay start of Pharmacological VTE agent (>24hrs) due to surgical blood loss or risk of bleeding: yes

## 2015-08-18 NOTE — Progress Notes (Signed)
Fax received and placed on chart.

## 2015-08-19 MED ORDER — HYDROCODONE-ACETAMINOPHEN 5-325 MG PO TABS: 1.0000 | ORAL_TABLET | ORAL | Status: DC | PRN

## 2015-08-19 MED ORDER — METHOCARBAMOL 500 MG PO TABS: 500.0000 mg | ORAL_TABLET | Freq: Four times a day (QID) | ORAL | Status: DC | PRN

## 2015-08-19 MED ORDER — PANTOPRAZOLE SODIUM 40 MG PO TBEC: 40.0000 mg | DELAYED_RELEASE_TABLET | Freq: Every day | ORAL | Status: DC

## 2015-08-19 MED ORDER — ASPIRIN EC 81 MG PO TBEC: 81.0000 mg | DELAYED_RELEASE_TABLET | Freq: Every day | ORAL | Status: DC

## 2015-08-19 NOTE — Care Management Note (Signed)
Case Management Note  Patient Details  Name: Dawn Davidson MRN: [id number] Date of Birth: 27-Sep-1950  Subjective/Objective:                  Lumbar stenosis with neurogenic claudication Surgery - PLIF L4-5  Action/Plan: Cm spoke to patient who said that she is going home with her grandson and a friend and felt that she would have enough support at home. Patient declined needing any HH services. CM offered patient choice for DME RW and 3N1 and patient chose [facility]. CM called Merry Proud with [facility] to advise of need for RW and 3N1 and he will deliver to the bedside. Patient said that he has no trouble affording medications and no additional needs at this time.   Expected Discharge Date:  08/19/15               Expected Discharge Plan:  Home/Self Care  In-House Referral:     Discharge planning Services  CM Consult  Post Acute Care Choice:  Durable Medical Equipment Choice offered to:  Patient  DME Arranged:  3-N-1, Walker rolling DME Agency:  [place]:    [place]:     Status of Service:  Completed, signed off  Medicare Important Message Given:    Date Medicare IM Given:    Medicare IM give by:    Date Additional Medicare IM Given:    Additional Medicare Important Message give by:     If discussed at [place] of Stay Meetings, dates discussed:    Additional Comments:  Guido Sander, RN 08/19/2015, 12:43 PM

## 2015-08-19 NOTE — Progress Notes (Signed)
Occupational Therapy Evaluation Patient Details Name: Dawn Davidson MRN: [id number] DOB: 04/04/1951 Today's Date: 08/19/2015    History of Present Illness s/p L4-5 Max PLIF   Clinical Impression   Began education on back precautions and ADL and functional mobility for ADL. Will follow acutely to address established goals.     Follow Up Recommendations  No OT follow up;Supervision - Intermittent    Equipment Recommendations  3 in 1 bedside comode    Recommendations for Other Services       Precautions / Restrictions Precautions Precautions: Back Precaution Booklet Issued: Yes (comment) Required Braces or Orthoses: Spinal Brace Spinal Brace: Lumbar corset;Applied in sitting position      Mobility Bed Mobility Overal bed mobility: Needs Assistance Bed Mobility: Sidelying to Sit   Sidelying to sit: Min guard       General bed mobility comments: vc for log rolling technique  Transfers Overall transfer level: Needs assistance   Transfers: Sit to/from Stand Sit to Stand: Min assist         General transfer comment: vc to adhere to precautions.    Balance Overall balance assessment: Needs assistance   Sitting balance-Leahy Scale: Good       Standing balance-Leahy Scale: Fair                              ADL Overall ADL's : Needs assistance/impaired     Grooming: Supervision/safety;Set up   Upper Body Bathing: Set up;Supervision/ safety;Sitting   Lower Body Bathing: Sit to/from stand;Moderate assistance   Upper Body Dressing : Set up;Supervision/safety;Sitting   Lower Body Dressing: Moderate assistance   Toilet Transfer: Minimal assistance;BSC;Ambulation   Toileting- Clothing Manipulation and Hygiene: Moderate assistance Toileting - Clothing Manipulation Details (indicate cue type and reason): cues to follow precautions     Functional mobility during ADLs: Minimal assistance General ADL Comments: Began education on back  precautions for ADL. Pt will need to use AE. will also need BSC. Concerned about how she is going to complete pericare.      Vision     Perception     Praxis      Pertinent Vitals/Pain Pain Assessment: 0-10 Pain Score: 3  Pain Location: back Pain Descriptors / Indicators: Aching Pain Intervention(s): Limited activity within patient's tolerance     Hand Dominance Right   Extremity/Trunk Assessment Upper Extremity Assessment Upper Extremity Assessment: Overall WFL for tasks assessed       Cervical / Trunk Assessment Cervical / Trunk Assessment: Other exceptions (back fusion)   Communication Communication Communication: No difficulties   Cognition Arousal/Alertness: Awake/alert Behavior During Therapy: WFL for tasks assessed/performed Overall Cognitive Status: Within Functional Limits for tasks assessed                     General Comments       Exercises       Shoulder Instructions      Home Living Family/patient expects to be discharged to:: Private residence Living Arrangements: Non-relatives/Friends;Other relatives Available Help at Discharge: Family;Friend(s);Available 24 hours/day Type of Home: House Home Access: Level entry     Home Layout: Two level;Bed/bath upstairs;Full bath on main level Alternate Level Stairs-Number of Steps: 13 Alternate Level Stairs-Rails: Left Bathroom Shower/Tub: Tub/shower unit Shower/tub characteristics: Architectural technologist: Standard Bathroom Accessibility: Yes How Accessible: Accessible via walker Home Equipment: Grab bars - tub/shower          Prior Functioning/Environment Level of Independence:  Independent             OT Diagnosis: Generalized weakness;Acute pain   OT Problem List: Decreased strength;Decreased knowledge of use of DME or AE;Decreased knowledge of precautions;Obesity;Pain   OT Treatment/Interventions: Self-care/ADL training;DME and/or AE instruction;Therapeutic  activities;Patient/family education    OT Goals(Current goals can be found in the care plan section) Acute Rehab OT Goals Patient Stated Goal: to be able to become more active OT Goal Formulation: With patient Time For Goal Achievement: 08/26/15 Potential to Achieve Goals: Good ADL Goals Pt Will Perform Lower Body Bathing: with set-up;with supervision;with adaptive equipment;sit to/from stand Pt Will Perform Lower Body Dressing: with set-up;with supervision;with adaptive equipment;sit to/from stand Pt Will Transfer to Toilet: with modified independence;ambulating;bedside commode Pt Will Perform Toileting - Clothing Manipulation and hygiene: with set-up;with supervision;sit to/from stand;with adaptive equipment;sitting/lateral leans Pt Will Perform Tub/Shower Transfer: with supervision;ambulating;3 in 1;Tub transfer;rolling walker Additional ADL Goal #1: Pt will independently verbalize 3/3 back precautions for ADL  OT Frequency: Min 2X/week   Barriers to D/C:            Co-evaluation              End of Session Equipment Utilized During Treatment: Back brace Nurse Communication: Mobility status;Precautions  Activity Tolerance: Patient tolerated treatment well Patient left: in chair;with call bell/phone within reach   Time: 0940-1011 OT Time Calculation (min): 31 min Charges:  OT General Charges $OT Visit: 1 Procedure OT Evaluation $Initial OT Evaluation Tier I: 1 Procedure OT Treatments $Self Care/Home Management : 8-22 mins G-Codes:    Ma Munoz,HILLARY September 16, 2015, 11:01 AM   Maurie Boettcher, OTR/L  [phone] 16-Sep-2015

## 2015-08-19 NOTE — Discharge Summary (Addendum)
Physician Discharge Summary  Patient ID: Dawn Davidson MRN: [id number] DOB/AGE: 05-Nov-1950 64 y.o.  Admit date: 08/18/2015 Discharge date: 08/19/2015  Admission Diagnoses: Lumbar stenosis  Discharge Diagnoses: Same Active Problems:   Lumbar stenosis with neurogenic claudication   Discharged Condition: Stable  Hospital Course:  Mrs. Dawn Davidson is a 64 y.o. female electively admitted after uncomplicated lumbar fusion. She was at baseline postop and was observed overnight. She was ambulating well, voiding, tolerating diet with pain controlled with oral medication and requested discharge.  Treatments: Surgery - PLIF L4-5  Discharge Exam: Blood pressure 84/28, pulse 62, temperature 98.3 F (36.8 C), temperature source Oral, resp. rate 18, height 5\' 3"  (1.6 m), weight 118 kg (260 lb 2.3 oz), SpO2 91 %. Awake, alert, oriented Speech fluent, appropriate CN grossly intact 5/5 BUE/BLE Wound c/d/i  Disposition: Home      Discharge Instructions    For home use only DME Bedside commode    Complete by:  As directed             Medication List    STOP taking these medications        acetaminophen 500 MG tablet  Commonly known as:  TYLENOL      TAKE these medications        aspirin EC 81 MG tablet  Take 1 tablet (81 mg total) by mouth daily.  Start taking on:  08/25/2015     atorvastatin 40 MG tablet  Commonly known as:  LIPITOR  Take 40 mg by mouth at bedtime.     cholecalciferol 1000 UNITS tablet  Commonly known as:  VITAMIN D  Take 1,000 Units by mouth daily.     HYDROcodone-acetaminophen 5-325 MG tablet  Commonly known as:  NORCO/VICODIN  Take 1-2 tablets by mouth every 4 (four) hours as needed for moderate pain.     levothyroxine 50 MCG tablet  Commonly known as:  SYNTHROID, LEVOTHROID  Take 50 mcg by mouth daily before breakfast.     losartan 50 MG tablet  Commonly known as:  COZAAR  Take 50 mg by mouth daily.     methocarbamol 500 MG tablet   Commonly known as:  ROBAXIN  Take 1 tablet (500 mg total) by mouth every 6 (six) hours as needed for muscle spasms.     metoprolol tartrate 25 MG tablet  Commonly known as:  LOPRESSOR  Take 25 mg by mouth 2 (two) times daily.     nitroGLYCERIN 0.4 MG SL tablet  Commonly known as:  NITROSTAT  Place 0.4 mg under the tongue every 5 (five) minutes as needed for chest pain.     omeprazole 20 MG capsule  Commonly known as:  PRILOSEC  Take 20 mg by mouth daily.     tiZANidine 4 MG tablet  Commonly known as:  ZANAFLEX  Take 4 mg by mouth 2 (two) times daily.     traZODone 50 MG tablet  Commonly known as:  DESYREL  Take 50 mg by mouth at bedtime.     venlafaxine 75 MG tablet  Commonly known as:  EFFEXOR  Take 75 mg by mouth 2 (two) times daily with a meal.       Follow-up Information    Follow up with STERN,JOSEPH D, MD In 2 weeks.   Specialty:  Neurosurgery   Contact information:   [address] [place] [address] [place] [address] [phone]       Signed: Consuella Lose, Loletha Grayer 08/19/2015, 11:07 AM

## 2015-08-19 NOTE — Evaluation (Signed)
Physical Therapy Evaluation Patient Details Name: Dawn Davidson MRN: [id number] DOB: 03/28/1951 Today's Date: 08/19/2015   History of Present Illness  Pt is a 64 y/o F s/p L4-5 Max PLIF.  Pt's PMH includes anxiety, depression, MI, morbid obesity.  Clinical Impression  Patient is s/p above surgery resulting in functional limitations due to the deficits listed below (see PT Problem List). Ms. Schabacker requires supervision>min guard assist for all mobility.  She demonstrated ability to ascend/descend steps and ambulate w/ safe technique using RW.  Pt was able to verbalize all back precautions.  She will have 24/7 assist available at d/c. Patient will benefit from skilled PT to increase their independence and safety with mobility to allow discharge to the venue listed below.      Follow Up Recommendations No PT follow up;Supervision/Assistance - 24 hour    Equipment Recommendations  3in1 (PT) (pt reports she has a RW at home)    Recommendations for Other Services       Precautions / Restrictions Precautions Precautions: Back Precaution Booklet Issued: No Precaution Comments: Pt was able to recall all back precautions Required Braces or Orthoses: Spinal Brace Spinal Brace: Lumbar corset;Applied in sitting position Restrictions Weight Bearing Restrictions: No      Mobility  Bed Mobility Overal bed mobility: Needs Assistance Bed Mobility: Sit to Sidelying;Rolling Rolling: Supervision       Sit to sidelying: Supervision General bed mobility comments: Pt did not require cues, pt had good recall of log roll technique from previous OT session.  No use of rail and HOB flat to simulate home environment.  Transfers Overall transfer level: Needs assistance Equipment used: Rolling walker (2 wheeled) Transfers: Sit to/from Stand Sit to Stand: Supervision         General transfer comment: Cues for technique using RW.  Pt slow to stand and sit.  Discussed technique to use for car  transfer, pt and pt's friend verbalized understanding.  Ambulation/Gait Ambulation/Gait assistance: Min guard Ambulation Distance (Feet): 100 Feet Assistive device: Rolling walker (2 wheeled) Gait Pattern/deviations: Decreased stride length;Antalgic;Trunk flexed;Step-through pattern (Lt trendelenburg)   Gait velocity interpretation: Below normal speed for age/gender General Gait Details: Cues for proper positioning of RW to promote upright posture.  Lt trendelenburg present.  Instability noted when trialed no AD, pt instructed to use RW at all times.  Stairs Stairs: Yes Stairs assistance: Min guard Stair Management: One rail Left;Sideways;Step to pattern Number of Stairs: 2 (x4) General stair comments: Cues for technique and min guard for safety. Instructed pt to have assist at home to bring RW to top/bottom of steps.  Wheelchair Mobility    Modified Rankin (Stroke Patients Only)       Balance Overall balance assessment: Needs assistance Sitting-balance support: Feet supported Sitting balance-Leahy Scale: Good     Standing balance support: Bilateral upper extremity supported;During functional activity Standing balance-Leahy Scale: Fair Standing balance comment: Instability noted standing w/o RW                             Pertinent Vitals/Pain Pain Assessment: 0-10 Pain Score: 2  Pain Location: back Pain Descriptors / Indicators: Aching;Sore Pain Intervention(s): Limited activity within patient's tolerance;Monitored during session;Repositioned    Home Living Family/patient expects to be discharged to:: Private residence Living Arrangements: Non-relatives/Friends;Other relatives Available Help at Discharge: Family;Friend(s);Available 24 hours/day Type of Home: House Home Access: Level entry     Home Layout: Two level;Bed/bath upstairs;Full bath on main level  Home Equipment: Grab bars - tub/shower;Walker - 2 wheels (told PT that she has RW, told RN she  didn't have one)      Prior Function Level of Independence: Independent               Hand Dominance   Dominant Hand: Right    Extremity/Trunk Assessment   Upper Extremity Assessment: Defer to OT evaluation           Lower Extremity Assessment: RLE deficits/detail RLE Deficits / Details: grossly 4/5 strength    Cervical / Trunk Assessment: Other exceptions  Communication   Communication: No difficulties  Cognition Arousal/Alertness: Awake/alert Behavior During Therapy: WFL for tasks assessed/performed Overall Cognitive Status: Within Functional Limits for tasks assessed                      General Comments General comments (skin integrity, edema, etc.): Discussed proper positioning in bed and proper use of pillows as well as when to use brace    Exercises        Assessment/Plan    PT Assessment Patient needs continued PT services  PT Diagnosis Difficulty walking;Acute pain   PT Problem List Decreased strength;Decreased activity tolerance;Decreased balance;Decreased mobility;Decreased knowledge of use of DME;Decreased safety awareness;Decreased knowledge of precautions;Pain  PT Treatment Interventions DME instruction;Gait training;Stair training;Functional mobility training;Therapeutic activities;Therapeutic exercise;Balance training;Neuromuscular re-education;Patient/family education;Modalities   PT Goals (Current goals can be found in the Care Plan section) Acute Rehab PT Goals Patient Stated Goal: to be able to become more active PT Goal Formulation: With patient/family Time For Goal Achievement: 08/26/15 Potential to Achieve Goals: Good    Frequency Min 5X/week   Barriers to discharge Inaccessible home environment steps to get to bedroom in home    Co-evaluation               End of Session Equipment Utilized During Treatment: Gait belt;Back brace Activity Tolerance: Patient tolerated treatment well Patient left: in bed;with call  bell/phone within reach Nurse Communication: Mobility status         Time: 1235-1258 PT Time Calculation (min) (ACUTE ONLY): 23 min   Charges:   PT Evaluation $Initial PT Evaluation Tier I: 1 Procedure PT Treatments $Gait Training: 8-22 mins   PT G CodesJoslyn Hy PT, DPT [phone] Pager: [phone] 08/19/2015, 1:49 PM

## 2015-08-19 NOTE — Progress Notes (Signed)
Occupational Therapy Treatment Patient Details Name: Dawn Davidson MRN: [id number] DOB: Feb 19, 1951 Today's Date: 08/19/2015    History of present illness Pt is a 64 y/o F s/p L4-5 Max PLIF.  Pt's PMH includes anxiety, depression, MI, morbid obesity.   OT comments  Seen for education regarding ADL and functional mobility while adhering to back precautions. Family prestn. Pt ready to D/C when medically stable.   Follow Up Recommendations  No OT follow up;Supervision - Intermittent    Equipment Recommendations  3 in 1 bedside comode    Recommendations for Other Services      Precautions / Restrictions Precautions Precautions: Back Precaution Booklet Issued: No Precaution Comments: Pt was able to recall all back precautions Required Braces or Orthoses: Spinal Brace Spinal Brace: Lumbar corset;Applied in sitting position Restrictions Weight Bearing Restrictions: No       Mobility Bed Mobility Overal bed mobility: Needs Assistance Bed Mobility: Sidelying to Sit;Sit to Sidelying Rolling: Supervision Sidelying to sit: Supervision     Sit to sidelying: Supervision General bed mobility comments: Pt did not require cues, pt had good recall of log roll technique from previous OT session.  No use of rail and HOB flat to simulate home environment.  Transfers Overall transfer level: Needs assistance Equipment used: Rolling walker (2 wheeled) Transfers: Sit to/from Stand Sit to Stand: Supervision           Balance Overall balance assessment: Needs assistance Sitting-balance support: Feet supported Sitting balance-Leahy Scale: Good     Standing balance support: Bilateral upper extremity supported;During functional activity Standing balance-Leahy Scale: Fair Standing balance comment: Instability noted standing w/o RW                   ADL                                         General ADL Comments: Educated pt on use of AE for LB ADL, Pt able  to return demonstrate. educated on ome set up and reducing risk of falls. Recommended pt use 3 in 1 for shower. discussed importance of faollowing precautions until she is told otherwise by MD. Will have assistance as needed by family.      Vision                     Perception     Praxis      Cognition   Behavior During Therapy: WFL for tasks assessed/performed Overall Cognitive Status: Within Functional Limits for tasks assessed                       Extremity/Trunk Assessment  Upper Extremity Assessment Upper Extremity Assessment: Defer to OT evaluation   Lower Extremity Assessment Lower Extremity Assessment: RLE deficits/detail RLE Deficits / Details: grossly 4/5 strength   Cervical / Trunk Assessment Cervical / Trunk Assessment: Other exceptions Cervical / Trunk Exceptions: back fusion    Exercises     Shoulder Instructions       General Comments      Pertinent Vitals/ Pain       Pain Assessment: 0-10 Pain Score: 4  Pain Location: back Pain Descriptors / Indicators: Aching Pain Intervention(s): Limited activity within patient's tolerance;Repositioned  Home Living Family/patient expects to be discharged to:: Private residence Living Arrangements: Non-relatives/Friends;Other relatives Available Help at Discharge: Family;Friend(s);Available 24 hours/day Type of Home: [place]  Access: Level entry     Home Layout: Two level;Bed/bath upstairs;Full bath on main level Alternate Level Stairs-Number of Steps: 13 Alternate Level Stairs-Rails: Left           Home Equipment: Grab bars - tub/shower;Walker - 2 wheels (told PT that she has RW, told RN she didn't have one)          Prior Functioning/Environment Level of Independence: Independent            Frequency Min 2X/week     Progress Toward Goals  OT Goals(current goals can now be found in the care plan section)  Progress towards OT goals: Progressing toward goals  Acute  Rehab OT Goals Patient Stated Goal: to be able to become more active OT Goal Formulation: With patient Time For Goal Achievement: 08/26/15 Potential to Achieve Goals: Good ADL Goals Pt Will Perform Lower Body Bathing: with set-up;with supervision;with adaptive equipment;sit to/from stand Pt Will Perform Lower Body Dressing: with set-up;with supervision;with adaptive equipment;sit to/from stand Pt Will Transfer to Toilet: with modified independence;ambulating;bedside commode Pt Will Perform Toileting - Clothing Manipulation and hygiene: with set-up;with supervision;sit to/from stand;with adaptive equipment;sitting/lateral leans Pt Will Perform Tub/Shower Transfer: with supervision;ambulating;3 in 1;Tub transfer;rolling walker Additional ADL Goal #1: Pt will independently verbalize 3/3 back precautions for ADL  Plan Discharge plan remains appropriate    Co-evaluation                 End of Session     Activity Tolerance Patient tolerated treatment well   Patient Left in bed;with call bell/phone within reach;with family/visitor present   Nurse Communication Mobility status        Time: 1420-1443 OT Time Calculation (min): 23 min  Charges: OT General Charges $OT Visit: 1 Procedure OT Treatments $Self Care/Home Management : 23-37 mins  Elayne Gruver,HILLARY 08/19/2015, 3:18 PM   [facility], OTR/L  [phone] 08/19/2015

## 2015-08-19 NOTE — Progress Notes (Signed)
Patient ready for discharge to home; BP improved; equipment delivered; discharge instructions given and reviewed;Rx given; patient discharged out via wheelchair with brace on; accompanied home by her family members.

## 2015-08-19 NOTE — Progress Notes (Signed)
Clinical Social Work  CSW received inappropriate referral to assist with SNF placement. Per chart review, PT/OT recommend no follow up at DC. CSW is signing off but available if needed.  Sindy Messing, LCSW Weekend Coverage

## 2015-08-20 NOTE — Care Management (Signed)
CM received call from patient 08/20/15 @ 0915 stating that she was having increased back pain and thought a hospital bed may help. CM advised for patient to call her neurosurgeons office and notify of the pain and they can call the order for hospital bed into [facility]. Patient was agreeable and CM offered callback information to patient and she declined and said she had the information and would call.

## 2015-08-21 ENCOUNTER — Encounter (HOSPITAL_COMMUNITY): Payer: Self-pay | Admitting: Neurosurgery

## 2015-10-09 ENCOUNTER — Ambulatory Visit (INDEPENDENT_AMBULATORY_CARE_PROVIDER_SITE_OTHER): Payer: Medicare Other | Admitting: Neurology

## 2015-10-09 ENCOUNTER — Encounter: Payer: Self-pay | Admitting: Neurology

## 2015-10-09 VITALS — BP 130/62 | HR 64 | Ht 63.0 in | Wt 257.0 lb

## 2015-10-09 DIAGNOSIS — R202 Paresthesia of skin: Secondary | ICD-10-CM

## 2015-10-09 DIAGNOSIS — G250 Essential tremor: Secondary | ICD-10-CM

## 2015-10-09 NOTE — Progress Notes (Signed)
Dawn Davidson was seen today in neurologic consultation at the request of Dr. Vertell Limber.  Her PCP is Lilian Coma, MD.  The consultation is for the evaluation of head tremor.  The patient states that it first started about 10 years ago, but patient reports that she had no idea until she recently asked her daughter.  It does not particularly bother the patient and she does not notice it.  She does state that the only thing that is bothering her is that she is dropping things, R more than L hand.  The R hand is "numb" with all the fingers.  She is L handed.  She does awaken with the hands numb.  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  She had back surgery 6 weeks ago and thought that it would help her hands but it didn't so that is what bothers her.  No neck pain but her neck "cracks."  She does have a hx of migraine but that is better over the recent years.   ALLERGIES:   Allergies  Allergen Reactions  . Sulfa Antibiotics Shortness Of Breath    CURRENT MEDICATIONS:  Outpatient Encounter Prescriptions as of 10/09/2015  Medication Sig  . aspirin EC 81 MG tablet Take 1 tablet (81 mg total) by mouth daily.  Marland Kitchen atorvastatin (LIPITOR) 40 MG tablet Take 40 mg by mouth at bedtime.  . cholecalciferol (VITAMIN D) 1000 UNITS tablet Take 1,000 Units by mouth daily.  . DULoxetine (CYMBALTA) 60 MG capsule Take 60 mg by mouth 2 (two) times daily.  Marland Kitchen levothyroxine (SYNTHROID, LEVOTHROID) 50 MCG tablet Take 50 mcg by mouth daily before breakfast.   . metoprolol tartrate (LOPRESSOR) 25 MG tablet Take 25 mg by mouth 2 (two) times daily.   Marland Kitchen omeprazole (PRILOSEC) 20 MG capsule Take 20 mg by mouth daily.  . traZODone (DESYREL) 50 MG tablet Take 50 mg by mouth at bedtime.  . nitroGLYCERIN (NITROSTAT) 0.4 MG SL tablet Place 0.4 mg under the tongue every 5 (five) minutes as needed for chest pain. Reported on 10/09/2015  . [DISCONTINUED] HYDROcodone-acetaminophen (NORCO/VICODIN) 5-325 MG tablet Take 1-2 tablets by  mouth every 4 (four) hours as needed for moderate pain.  . [DISCONTINUED] losartan (COZAAR) 50 MG tablet Take 50 mg by mouth daily.  . [DISCONTINUED] methocarbamol (ROBAXIN) 500 MG tablet Take 1 tablet (500 mg total) by mouth every 6 (six) hours as needed for muscle spasms.  . [DISCONTINUED] tiZANidine (ZANAFLEX) 4 MG tablet Take 4 mg by mouth 2 (two) times daily.  . [DISCONTINUED] venlafaxine (EFFEXOR) 75 MG tablet Take 75 mg by mouth 2 (two) times daily with a meal.    No facility-administered encounter medications on file as of 10/09/2015.    PAST MEDICAL HISTORY:   Past Medical History  Diagnosis Date  . Hypertension   . Hyperlipidemia   . Morbid obesity ([place]) 05/10/2015  . Family history of adverse reaction to anesthesia     her mother gets sick  . Coronary artery disease     2010 MI  . GERD (gastroesophageal reflux disease)   . Temporary low platelet count ([place]) 2012    checked every 6 mths...kept her in for 1 week with MI, and she's not sure exactly why  . Myocardial infarction ([place]) 2012  . Hypothyroidism since 02/2015  . Migraine     "q couple weeks recently" (08/18/2015)  . Arthritis     "neck, back, hands, knees, hips" (08/18/2015)  . Chronic lower back pain   .  Anxiety   . Depression     PAST SURGICAL HISTORY:   Past Surgical History  Procedure Laterality Date  . Cardiac catheterization  2013  . Replacement total knee Right 2007  . Gastric restriction surgery  1974  . Coronary angioplasty with stent placement  2013    12/09/11 95% stenosis proximal RCA->DES; otherwise no significant CAD  . Tonsillectomy    . Cholecystectomy open    . Breast biopsy Left 08/2015  . Maximum access (mas)posterior lumbar interbody fusion (plif) 1 level N/A 08/18/2015    Procedure: Lumbar four-five Maximum access posterior lumbar interbody fusion;  Surgeon: Erline Levine, MD;  Location: [facility];  Service: Neurosurgery;  Laterality: N/A;  L4-5 Maximum access posterior lumbar interbody  fusion    SOCIAL HISTORY:   Social History   Social History  . Marital Status: Divorced    Spouse Name: N/A  . Number of Children: N/A  . Years of Education: N/A   Occupational History  . Not on file.   Social History Main Topics  . Smoking status: Never Smoker   . Smokeless tobacco: Never Used  . Alcohol Use: No  . Drug Use: No  . Sexual Activity: No   Other Topics Concern  . Not on file   Social History Narrative    FAMILY HISTORY:   Family Status  Relation Status Death Age  . Mother Deceased     stomach cancer  . Father Deceased     cancer (? lung)  . Sister Alive     unknown  . Brother Alive     colon cancer  . Daughter Alive     healthy  . Daughter Alive   . Grandchild Alive   . Grandchild Alive     ROS:  Chronic diarrhea since gastric surgery.  A complete 10 system review of systems was obtained and was unremarkable apart from what is mentioned above.  PHYSICAL EXAMINATION:    VITALS:   Filed Vitals:   10/09/15 1235  BP: 130/62  Pulse: 64  Height: 5\' 3"  (1.6 m)  Weight: 257 lb (116.574 kg)    GEN:  Normal appears female in no acute distress.  Appears stated age. HEENT:  Normocephalic, atraumatic. The mucous membranes are moist. The superficial temporal arteries are without ropiness or tenderness. Cardiovascular: Regular rate and rhythm. Lungs: Clear to auscultation bilaterally. Neck/Heme: There are no carotid bruits noted bilaterally.  NEUROLOGICAL: Orientation:  The patient is alert and oriented x 3.  Fund of knowledge is appropriate.  Recent and remote memory intact.  Attention span and concentration normal.  Repeats and names without difficulty. Cranial nerves: There is good facial symmetry. The pupils are equal round and reactive to light bilaterally. Fundoscopic exam reveals clear disc margins bilaterally. Extraocular muscles are intact and visual fields are full to confrontational testing. Speech is fluent and clear. Soft palate rises  symmetrically and there is no tongue deviation. Hearing is intact to conversational tone. Tone: Tone is good throughout. Sensation: Sensation is intact to light touch and pinprick throughout (facial, trunk, extremities). Vibration is intact at the bilateral big toe. There is no extinction with double simultaneous stimulation. There is no sensory dermatomal level identified. Coordination:  The patient has no difficulty with RAM's or FNF bilaterally. Motor: Strength is 5/5 in the bilateral upper and lower extremities.  Shoulder shrug is equal and symmetric. There is no pronator drift.  There are no fasciculations noted. There is no wasting of the intrinsic muscles of  the hand.  There is some prominence of the tendons of the hands (patient has a history of trigger finger). DTR's: Deep tendon reflexes are 2/4 at the bilateral biceps, triceps, brachioradialis, patella and achilles.  Plantar responses are downgoing bilaterally. Gait and Station: The patient ambulates with a very antalgic gait.  She drags the right leg somewhat.  Abnormal movements: I saw almost no head tremor throughout the entire history and physical.  I saw no evidence of cervical dystonia.  No trouble with archimedes spirals.  No evidence of tremor when writing a sentence.   IMPRESSION/PLAN  1.  Head tremor.  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  I saw no evidence of cervical dystonia.  I saw almost no head tremor today.  The patient is not bothered by it and in fact has never noticed it.  She wants no medication.  She wants no further investigation. 2.   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 She and I discussed the nature and etiology an discussed why it might be important to investigate this further. In the end, however, she decided that she did not want to proceed.  She refused EMG.  She will let me know if she decides to proceed  in the future and conditions call us. I would recommend that she wear cockup wrist splints. 3.   I will see her back on an as-needed basis.

## 2015-10-16 DIAGNOSIS — M5416 Radiculopathy, lumbar region: Secondary | ICD-10-CM | POA: Diagnosis not present

## 2015-10-16 DIAGNOSIS — M4316 Spondylolisthesis, lumbar region: Secondary | ICD-10-CM | POA: Diagnosis not present

## 2015-10-16 DIAGNOSIS — M545 Low back pain: Secondary | ICD-10-CM | POA: Diagnosis not present

## 2015-10-16 DIAGNOSIS — M4120 Other idiopathic scoliosis, site unspecified: Secondary | ICD-10-CM | POA: Diagnosis not present

## 2015-10-16 DIAGNOSIS — M4806 Spinal stenosis, lumbar region: Secondary | ICD-10-CM | POA: Diagnosis not present

## 2015-10-24 ENCOUNTER — Other Ambulatory Visit: Payer: Self-pay | Admitting: General Surgery

## 2015-10-24 DIAGNOSIS — Z9889 Other specified postprocedural states: Secondary | ICD-10-CM | POA: Diagnosis not present

## 2015-10-24 DIAGNOSIS — Z6841 Body Mass Index (BMI) 40.0 and over, adult: Secondary | ICD-10-CM | POA: Diagnosis not present

## 2015-10-24 DIAGNOSIS — Z981 Arthrodesis status: Secondary | ICD-10-CM | POA: Diagnosis not present

## 2015-10-24 DIAGNOSIS — I2510 Atherosclerotic heart disease of native coronary artery without angina pectoris: Secondary | ICD-10-CM | POA: Diagnosis not present

## 2015-10-24 DIAGNOSIS — N6092 Unspecified benign mammary dysplasia of left breast: Secondary | ICD-10-CM

## 2015-10-24 DIAGNOSIS — Z9049 Acquired absence of other specified parts of digestive tract: Secondary | ICD-10-CM | POA: Diagnosis not present

## 2015-10-24 DIAGNOSIS — Z96653 Presence of artificial knee joint, bilateral: Secondary | ICD-10-CM | POA: Diagnosis not present

## 2015-10-31 ENCOUNTER — Telehealth: Payer: Self-pay

## 2015-10-31 NOTE — Telephone Encounter (Signed)
Request for surgical clearance:   1. What type surgery is being performed? Left breast lumpectomy with rsl  2. When is this surgery scheduled? pending  3. Are there any medications that need to be held prior to surgery and how long? Patient is on an 81 mg ASA  4. Name of the physician performing surgery: Dr Fanny Skates  5. What is the office phone and fax number? [facility]  Phone [phone]  Fax [phone]

## 2015-11-03 NOTE — Telephone Encounter (Signed)
FORWARD/ROUTED TO CENTRAL  SURGERY

## 2015-11-03 NOTE — Telephone Encounter (Signed)
Dawn Davidson is at low risk for breast surgery.  Aspirin can be held for up to 5 days.

## 2015-11-07 DIAGNOSIS — F322 Major depressive disorder, single episode, severe without psychotic features: Secondary | ICD-10-CM | POA: Diagnosis not present

## 2015-11-07 DIAGNOSIS — E1122 Type 2 diabetes mellitus with diabetic chronic kidney disease: Secondary | ICD-10-CM | POA: Diagnosis not present

## 2015-11-07 DIAGNOSIS — Z79899 Other long term (current) drug therapy: Secondary | ICD-10-CM | POA: Diagnosis not present

## 2015-11-07 DIAGNOSIS — E039 Hypothyroidism, unspecified: Secondary | ICD-10-CM | POA: Diagnosis not present

## 2015-11-13 ENCOUNTER — Other Ambulatory Visit: Payer: Self-pay | Admitting: General Surgery

## 2015-11-13 DIAGNOSIS — N6092 Unspecified benign mammary dysplasia of left breast: Secondary | ICD-10-CM

## 2015-11-21 ENCOUNTER — Encounter (HOSPITAL_COMMUNITY)
Admission: RE | Admit: 2015-11-21 | Discharge: 2015-11-21 | Disposition: A | Discharge status: Home / Self Care | Payer: Medicare Other | Source: Ambulatory Visit | Attending: General Surgery | Admitting: General Surgery

## 2015-11-21 ENCOUNTER — Encounter (HOSPITAL_COMMUNITY): Payer: Self-pay

## 2015-11-21 DIAGNOSIS — Z9884 Bariatric surgery status: Secondary | ICD-10-CM | POA: Diagnosis not present

## 2015-11-21 DIAGNOSIS — Z955 Presence of coronary angioplasty implant and graft: Secondary | ICD-10-CM | POA: Diagnosis not present

## 2015-11-21 DIAGNOSIS — Z96653 Presence of artificial knee joint, bilateral: Secondary | ICD-10-CM | POA: Diagnosis not present

## 2015-11-21 DIAGNOSIS — I2510 Atherosclerotic heart disease of native coronary artery without angina pectoris: Secondary | ICD-10-CM | POA: Diagnosis not present

## 2015-11-21 DIAGNOSIS — Z9049 Acquired absence of other specified parts of digestive tract: Secondary | ICD-10-CM | POA: Diagnosis not present

## 2015-11-21 DIAGNOSIS — N6092 Unspecified benign mammary dysplasia of left breast: Secondary | ICD-10-CM | POA: Diagnosis not present

## 2015-11-21 HISTORY — DX: Unspecified macular degeneration: H35.30

## 2015-11-21 LAB — CBC WITH DIFFERENTIAL/PLATELET
Basophils Absolute: 0 10*3/uL (ref 0.0–0.1)
Basophils Relative: 0 %
Eosinophils Absolute: 0.2 10*3/uL (ref 0.0–0.7)
Eosinophils Relative: 3 %
HCT: 39.2 % (ref 36.0–46.0)
Hemoglobin: 12.4 g/dL (ref 12.0–15.0)
Lymphocytes Relative: 30 %
Lymphs Abs: 2.5 10*3/uL (ref 0.7–4.0)
MCH: 26.2 pg (ref 26.0–34.0)
MCHC: 31.6 g/dL (ref 30.0–36.0)
MCV: 82.9 fL (ref 78.0–100.0)
Monocytes Absolute: 0.5 10*3/uL (ref 0.1–1.0)
Monocytes Relative: 5 %
Neutro Abs: 5.2 10*3/uL (ref 1.7–7.7)
Neutrophils Relative %: 62 %
Platelets: 196 10*3/uL (ref 150–400)
RBC: 4.73 MIL/uL (ref 3.87–5.11)
RDW: 13.4 % (ref 11.5–15.5)
WBC: 8.4 10*3/uL (ref 4.0–10.5)

## 2015-11-21 LAB — COMPREHENSIVE METABOLIC PANEL
ALT: 10 U/L — ABNORMAL LOW (ref 14–54)
AST: 14 U/L — ABNORMAL LOW (ref 15–41)
Albumin: 3.4 g/dL — ABNORMAL LOW (ref 3.5–5.0)
Alkaline Phosphatase: 100 U/L (ref 38–126)
Anion gap: 13 (ref 5–15)
BUN: 11 mg/dL (ref 6–20)
CO2: 27 mmol/L (ref 22–32)
Calcium: 9.4 mg/dL (ref 8.9–10.3)
Chloride: 101 mmol/L (ref 101–111)
Creatinine, Ser: 0.84 mg/dL (ref 0.44–1.00)
GFR calc Af Amer: >60 mL/min (ref >60)
GFR calc non Af Amer: >60 mL/min (ref >60)
Glucose, Bld: 105 mg/dL — ABNORMAL HIGH (ref 65–99)
Potassium: 3.9 mmol/L (ref 3.5–5.1)
Sodium: 141 mmol/L (ref 135–145)
Total Bilirubin: 0.5 mg/dL (ref 0.3–1.2)
Total Protein: 6.7 g/dL (ref 6.5–8.1)

## 2015-11-21 NOTE — Pre-Procedure Instructions (Signed)
Leamarie Conway  11/21/2015      [facility] - [place], [place] - [address] [place] [address] Phone: [phone] Fax: [place], [place] [address] Phone: [phone] Fax: [phone]    Your procedure is scheduled on Thursday November 23 2015.  Report to [facility] Admitting at 530 A.M.  Call this number if you have problems the morning of surgery:  [phone]   Remember:  Do not eat food or drink liquids after midnight Wednesday March 22.  Take these medicines the morning of surgery with A SIP OF WATER duloxetine (cymbalta), levothyroxine (synthroid, levothroid), methocarbamol (robaxin) if needed, metoprolol tartrate (lopressor), omeprazole (prilosec), tramadol (ultram) if needed   Do not wear jewelry, make-up or nail polish.  Do not wear lotions, powders, or perfumes.  You may wear deodorant.  Do not shave 48 hours prior to surgery.  Men may shave face and neck.  Do not bring valuables to the hospital.  [facility] is not responsible for any belongings or valuables.  Contacts, dentures or bridgework may not be worn into surgery.  Leave your suitcase in the car.  After surgery it may be brought to your room.  For patients admitted to the hospital, discharge time will be determined by your treatment team.  Patients discharged the day of surgery will not be allowed to drive home.        Preparing for Surgery at [facility]  Before surgery, you can play an important role.  Because skin is not sterile, your skin needs to be as free of germs as possible.  You can reduce the number of germs on your skin by washing with CHG (chlorahexidine gluconate) Soap before surgery.  CHG is an antiseptic cleaner with kills germs and bonds with the skin to continue killing germs even after washing.   Please do not use if you have  an allergy to CHG or antibacterial soaps.  If your skin becomes reddened/irritated stop using the CHG.  Do not shave (including legs and underarms) for at least 48 hours prior to first CHG shower.  It is okay to shave your face.  Please follow these instructions carefully:  1. Shower with CHG Soap the night before surgery and the morning of Surgery. 2. If you choose to wash your hair, wash your hair first as usual with your normal shampoo. 3. After you shampoo, rinse your hair and body thoroughly to remove the Shampoo. 4. Use CHG as you would any other liquid soap. You can apply chg directly to the skin and wash gently with scrungie or a clean washcloth. 5. Apply the CHG Soap to your body ONLY FROM THE NECK DOWN. Do not use on open wounds or open sores. Avoid contact with your eyes, ears, mouth and genitals (private parts). Wash genitals (private parts) with your normal soap. 6. Wash thoroughly, paying special attention to the area where your surgery will be performed. 7. Thoroughly rinse your body with warm water from the neck down. 8. DO NOT shower/wash with your normal soap after using and rinsing off the CHG Soap. 9. Pat yourself dry with a clean towel.  10. Wear clean pajamas.  11. Place clean sheets on your bed the night of your first shower and do not sleep with pets.  Day of Surgery  Do not apply any lotions/deodorants the morning of surgery.  Please wear clean clothes to the hospital/surgery center.   Please read over the following fact sheets that you were given. Pain Booklet, Coughing and Deep Breathing and Surgical Site Infection Prevention

## 2015-11-22 ENCOUNTER — Ambulatory Visit
Admission: RE | Admit: 2015-11-22 | Discharge: 2015-11-22 | Disposition: A | Discharge status: Home / Self Care | Payer: Medicare Other | Source: Ambulatory Visit | Attending: General Surgery | Admitting: General Surgery

## 2015-11-22 DIAGNOSIS — N6092 Unspecified benign mammary dysplasia of left breast: Secondary | ICD-10-CM

## 2015-11-22 DIAGNOSIS — N6082 Other benign mammary dysplasias of left breast: Secondary | ICD-10-CM | POA: Diagnosis not present

## 2015-11-22 MED ORDER — DEXTROSE 5 % IV SOLN
3.0000 g | INTRAVENOUS | Status: AC
  Administered 2015-11-23: 3 g via INTRAVENOUS
  Filled 2015-11-22: qty 3000

## 2015-11-22 MED ORDER — CHLORHEXIDINE GLUCONATE 4 % EX LIQD: 1.0000 "application " | Freq: Once | CUTANEOUS | Status: DC

## 2015-11-22 NOTE — Progress Notes (Signed)
Anesthesia Chart Review:  Pt is a 65 year old female scheduled for L breast lumpectomy with radioactive seed localization on 11/23/2015 with Dr. Dalbert Batman.   Cardiologist is Dr. Skeet Latch.  PMH includes: CAD (MI 2010, stent in 2013 in [place]), HTN, hyperlipidemia, hypothyroidism, GERD. Never smoker. BMI 46. S/p PLIF 08/18/15.  Medications include: ASA, lipitor, levothyroxine, metoprolol, prilosec.   Preoperative labs reviewed.   Chest x-ray 08/14/15 reviewed. No acute cardiopulmonary abnormality  EKG 05/10/15: sinus rhythm. Borderline low voltage.   Nuclear stress test 05/31/15:   Nuclear stress EF: 65%.  The left ventricular ejection fraction is normal (55-65%).  No T wave inversion was noted during stress.  There was no ST segment deviation noted during stress.  This is a low risk study.  Cardiac cath 12/09/11 (correspondence dated 08/21/15 in media tab): - no significant lesions in LM, LAD, CX.  - complex 95% lesion in proximal RCA -> PCI/DES to RCA  Given low risk stress test from 05/2015 and recent PLIF in 08/2015 tolerated without issue, if no changes, I anticipate pt can proceed with surgery as scheduled.   Willeen Cass, FNP-BC [facility] Short Stay Surgical Center/Anesthesiology Phone: [phone] 11/22/2015 9:03 AM

## 2015-11-22 NOTE — H&P (Signed)
Dawn Davidson. Dawn Davidson  Location: [facility] Patient #: [id number] DOB: November 05, 1950 Divorced / Language: English / Race: White Female        History of Present Illness   The patient is a 65 year old female who presents with a complaint of atypical ductal hyperplasia left breast, lower outer quadrant. This is a 65 year old Caucasian female, referred to me by Dawn Davidson at the [facility] Dawn Davidson and Dawn Davidson at [facility] for evaluation of atypical ductal hyperplasia left breast lower outer quadrant.  The patient has no prior history of breast problems. Recent mammograms performed on November 1 and July 21, 2015 showed 2 areas of calcifications in the left breast. A 5 mm area of calcifications medially and a 5 mm focus of calcifications laterally. Both of these areas were biopsied at the [facility] [place]. The area in the upper inner quadrant shows benign fibrocystic changes. The area and the lower outer quadrant of the left breast shows atypical ductal hyperplasia. In both of these areas the clips are 1.7 cm away from the index lesion.  The patient was originally sent to Dr. Genia Davidson, a surgeon at [facility] and wire localization lumpectomy surgery was scheduled. The patient canceled that and wanted to be operated on closer to home.  Comorbidities include coronary artery disease with coronary stents placed in 2013. Open gastric bypass. Open cholecystectomy. Bilateral total knee replacement. Recent lumbar interbody fusion by Dr. Vertell Davidson. Only anticoagulant is aspirin. Excellent family history is negative for breast, ovarian, or pancreatic cancer. The patient is single. Has 2 children. Lives in [place]. She is here today with a friend of hers. Denies tobacco or alcohol. Recent spinal fusion.  She will be scheduled for left breast lumpectomy, lower outer quadrant ADH area, with radioactive seed localization. I discussed the  indications, details, techniques, and numerous risk of the surgery with her and her friend. She is aware of the risk of bleeding, infection, cosmetic deformity, skin necrosis, nerve damage with chronic pain or numbness, reoperation if this turns out to be in situ cancer. She understands all these issues. All of her questions are answered. She agrees with this plan.  She would like to proceed with this surgery as soon as we can get cardiac clearance with her cardiologist at [facility].   Other Problems  Arthritis Back Pain Depression High blood pressure Hypercholesterolemia Myocardial infarction Thyroid Disease  Past Surgical History  Gallbladder Surgery - Open Gastric Bypass Knee Surgery Right. Resection of Stomach Spinal Surgery - Lower Back Tonsillectomy  Diagnostic Studies History Mammogram within last year  Allergies  Sulfa Antibiotics  Medication History  Atorvastatin Calcium (40MG  Tablet, Oral) Active. Vitamin D (Cholecalciferol) (1000UNIT Capsule, Oral) Active. Cymbalta (60MG  Capsule DR Part, Oral) Active. Levothyroxine Sodium (50MCG Tablet, Oral) Active. Metoprolol Succinate ER (25MG  Tablet ER 24HR, Oral) Active. Nitrostat (0.6MG  Tab Sublingual, Sublingual) Active. Omeprazole (20MG  Tablet DR, Oral) Active. TraZODone HCl (50MG  Tablet, Oral) Active. Medications Reconciled  Social History  Alcohol use Remotely quit alcohol use. Caffeine use Carbonated beverages, Coffee. Tobacco use Never smoker.  Family History  Alcohol Abuse Brother, Daughter. Anesthetic complications Mother. Arthritis Mother. Cancer Mother. Colon Cancer Brother. Heart Disease Sister. Heart disease in female family member before age 52 Rectal Cancer Brother. Thyroid problems Daughter, Family Members In General.  Pregnancy / Birth History  Age at menarche 63 years. Age of menopause 41-50 Gravida 3 Maternal age 91-25 Para 2    Review  of Systems General Not Present-  Appetite Loss, Chills, Fatigue, Fever, Night Sweats, Weight Gain and Weight Loss. Skin Not Present- Change in Wart/Mole, Dryness, Hives, Jaundice, New Lesions, Non-Healing Wounds, Rash and Ulcer. HEENT Present- Wears glasses/contact lenses. Not Present- Earache, Hearing Loss, Hoarseness, Nose Bleed, Oral Ulcers, Ringing in the Ears, Seasonal Allergies, Sinus Pain, Sore Throat, Visual Disturbances and Yellow Eyes. Respiratory Not Present- Bloody sputum, Chronic Cough, Difficulty Breathing, Snoring and Wheezing. Breast Not Present- Breast Mass, Breast Pain, Nipple Discharge and Skin Changes. Cardiovascular Present- Leg Cramps. Not Present- Chest Pain, Difficulty Breathing Lying Down, Palpitations, Rapid Heart Rate, Shortness of Breath and Swelling of Extremities. Gastrointestinal Not Present- Abdominal Pain, Bloating, Bloody Stool, Change in Bowel Habits, Chronic diarrhea, Constipation, Difficulty Swallowing, Excessive gas, Gets full quickly at meals, Hemorrhoids, Indigestion, Nausea, Rectal Pain and Vomiting. Female Genitourinary Present- Nocturia. Not Present- Frequency, Painful Urination, Pelvic Pain and Urgency. Musculoskeletal Not Present- Back Pain, Joint Pain, Joint Stiffness, Muscle Pain, Muscle Weakness and Swelling of Extremities. Neurological Present- Decreased Memory and Numbness. Not Present- Fainting, Headaches, Seizures, Tingling, Tremor, Trouble walking and Weakness. Endocrine Not Present- Cold Intolerance, Excessive Hunger, Hair Changes, Heat Intolerance, Hot flashes and New Diabetes.  Vitals Weight: 257.8 lb Height: 63in Body Surface Area: 2.15 m Body Mass Index: 45.67 kg/m  Temp.: 82F(Temporal)  Pulse: 83 (Regular)  BP: 154/82 (Sitting, Left Arm, Standard)       Physical Exam  General Mental Status-Alert. General Appearance-Consistent with stated age. Hydration-Well hydrated. Voice-Normal.  Head and  Neck Head-normocephalic, atraumatic with no lesions or palpable masses. Trachea-midline. Thyroid Gland Characteristics - normal size and consistency.  Eye Eyeball - Bilateral-Extraocular movements intact. Sclera/Conjunctiva - Bilateral-No scleral icterus.  Chest and Lung Exam Chest and lung exam reveals -quiet, even and easy respiratory effort with no use of accessory muscles and on auscultation, normal breath sounds, no adventitious sounds and normal vocal resonance. Inspection Chest Wall - Normal. Back - normal.  Breast Note: Breasts are moderately large and soft. Significant ptosis. Biopsy sites on the left side are healed well. No hematoma or ecchymoses. No palpable mass on either side. No axillary adenopathy.   Cardiovascular Cardiovascular examination reveals -normal heart sounds, regular rate and rhythm with no murmurs and normal pedal pulses bilaterally.  Abdomen Note: Abdomen is obese. Soft and nontender. Upper midline scar. Upper right paramedian scar. No palpable mass. No obvious hernias examined supine   Neurologic Neurologic evaluation reveals -alert and oriented x 3 with no impairment of recent or remote memory. Mental Status-Normal. Note: Walking assisted with walker due to back pain from recent lumbar fusion.   Musculoskeletal Normal Exam - Left-Upper Extremity Strength Normal and Lower Extremity Strength Normal. Normal Exam - Right-Upper Extremity Strength Normal and Lower Extremity Strength Normal.  Lymphatic Head & Neck  General Head & Neck Lymphatics: Bilateral - Description - Normal. Axillary  General Axillary Region: Bilateral - Description - Normal. Tenderness - Non Tender. Femoral & Inguinal  Generalized Femoral & Inguinal Lymphatics: Bilateral - Description - Normal. Tenderness - Non Tender.    Assessment & Plan  ATYPICAL DUCTAL HYPERPLASIA OF LEFT BREAST (N60.92)    Your recent mammograms show an area of atypical  ductal hyperplasia in the left breast, lower outer quadrant This area should be conservatively excised to rule out in situ cancer. Most likely this is not cancer you'll be scheduled for left breast lumpectomy with radioactive seed localization We have discussed the indications, techniques, and numerous risk of this surgery in detail Please review the information that I have given  you We will schedule this surgery once we get cardiac clearance from your cardiologist.  CORONARY ARTERY DISEASE, OCCLUSIVE (Differential Diagnosis) (I25.10) Impression: Coronary artery stents placed 2013. Followed by Dr. Skeet Latch at [facility] [place] cardiology BMI 45.0-49.9, ADULT (Z68.42) HISTORY OF GASTRIC BYPASS (Z98.890) HISTORY OF CHOLECYSTECTOMY (Z90.49) S/P LUMBAR FUSION (Z98.1) Impression: Recent. Dr. Vertell Davidson. HISTORY OF KNEE REPLACEMENT, TOTAL, BILATERAL [phone])    Edsel Petrin. Dalbert Batman, M.D., [facility] General and Minimally invasive Surgery Breast and Colorectal Surgery Office:   [phone] Pager:   [phone]

## 2015-11-23 ENCOUNTER — Ambulatory Visit (HOSPITAL_COMMUNITY): Payer: Medicare Other | Admitting: Emergency Medicine

## 2015-11-23 ENCOUNTER — Encounter (HOSPITAL_COMMUNITY): Payer: Self-pay | Admitting: *Deleted

## 2015-11-23 ENCOUNTER — Ambulatory Visit (HOSPITAL_COMMUNITY): Payer: Medicare Other | Admitting: Anesthesiology

## 2015-11-23 ENCOUNTER — Encounter (HOSPITAL_COMMUNITY)
Admission: RE | Disposition: A | Discharge status: Home / Self Care | Payer: Self-pay | Source: Ambulatory Visit | Attending: General Surgery

## 2015-11-23 ENCOUNTER — Ambulatory Visit (HOSPITAL_COMMUNITY)
Admission: RE | Admit: 2015-11-23 | Discharge: 2015-11-23 | Disposition: A | Discharge status: Home / Self Care | Payer: Medicare Other | Source: Ambulatory Visit | Attending: General Surgery | Admitting: General Surgery

## 2015-11-23 ENCOUNTER — Ambulatory Visit
Admission: RE | Admit: 2015-11-23 | Discharge: 2015-11-23 | Disposition: A | Discharge status: Home / Self Care | Payer: Medicare Other | Source: Ambulatory Visit | Attending: General Surgery | Admitting: General Surgery

## 2015-11-23 DIAGNOSIS — Z955 Presence of coronary angioplasty implant and graft: Secondary | ICD-10-CM | POA: Insufficient documentation

## 2015-11-23 DIAGNOSIS — Z96653 Presence of artificial knee joint, bilateral: Secondary | ICD-10-CM | POA: Diagnosis not present

## 2015-11-23 DIAGNOSIS — N6082 Other benign mammary dysplasias of left breast: Secondary | ICD-10-CM | POA: Diagnosis not present

## 2015-11-23 DIAGNOSIS — I2510 Atherosclerotic heart disease of native coronary artery without angina pectoris: Secondary | ICD-10-CM | POA: Diagnosis not present

## 2015-11-23 DIAGNOSIS — K219 Gastro-esophageal reflux disease without esophagitis: Secondary | ICD-10-CM | POA: Diagnosis not present

## 2015-11-23 DIAGNOSIS — Z9884 Bariatric surgery status: Secondary | ICD-10-CM | POA: Insufficient documentation

## 2015-11-23 DIAGNOSIS — Z9049 Acquired absence of other specified parts of digestive tract: Secondary | ICD-10-CM | POA: Insufficient documentation

## 2015-11-23 DIAGNOSIS — M1990 Unspecified osteoarthritis, unspecified site: Secondary | ICD-10-CM | POA: Diagnosis not present

## 2015-11-23 DIAGNOSIS — N6012 Diffuse cystic mastopathy of left breast: Secondary | ICD-10-CM | POA: Diagnosis not present

## 2015-11-23 DIAGNOSIS — N6092 Unspecified benign mammary dysplasia of left breast: Secondary | ICD-10-CM | POA: Diagnosis present

## 2015-11-23 DIAGNOSIS — R921 Mammographic calcification found on diagnostic imaging of breast: Secondary | ICD-10-CM | POA: Diagnosis not present

## 2015-11-23 HISTORY — PX: BREAST LUMPECTOMY WITH RADIOACTIVE SEED LOCALIZATION: SHX6424

## 2015-11-23 HISTORY — DX: Unspecified benign mammary dysplasia of left breast: N60.92

## 2015-11-23 SURGERY — BREAST LUMPECTOMY WITH RADIOACTIVE SEED LOCALIZATION
Anesthesia: General | Site: Breast | Laterality: Left

## 2015-11-23 MED ORDER — ONDANSETRON HCL 4 MG/2ML IJ SOLN
INTRAMUSCULAR | Status: DC | PRN
  Administered 2015-11-23: 4 mg via INTRAVENOUS

## 2015-11-23 MED ORDER — SODIUM CHLORIDE 0.9% FLUSH: 3.0000 mL | INTRAVENOUS | Status: DC | PRN

## 2015-11-23 MED ORDER — PROPOFOL 10 MG/ML IV BOLUS
INTRAVENOUS | Status: DC | PRN
  Administered 2015-11-23: 150 mg via INTRAVENOUS

## 2015-11-23 MED ORDER — PHENYLEPHRINE 40 MCG/ML (10ML) SYRINGE FOR IV PUSH (FOR BLOOD PRESSURE SUPPORT)
PREFILLED_SYRINGE | INTRAVENOUS | Status: AC
  Filled 2015-11-23: qty 10

## 2015-11-23 MED ORDER — SODIUM CHLORIDE 0.9 % IV SOLN: 250.0000 mL | INTRAVENOUS | Status: DC | PRN

## 2015-11-23 MED ORDER — 0.9 % SODIUM CHLORIDE (POUR BTL) OPTIME
TOPICAL | Status: DC | PRN
  Administered 2015-11-23: 1000 mL

## 2015-11-23 MED ORDER — PHENYLEPHRINE HCL 10 MG/ML IJ SOLN
INTRAMUSCULAR | Status: DC | PRN
  Administered 2015-11-23 (×6): 80 ug via INTRAVENOUS

## 2015-11-23 MED ORDER — MIDAZOLAM HCL 5 MG/5ML IJ SOLN
INTRAMUSCULAR | Status: DC | PRN
  Administered 2015-11-23: 2 mg via INTRAVENOUS

## 2015-11-23 MED ORDER — LIDOCAINE HCL (CARDIAC) 20 MG/ML IV SOLN
INTRAVENOUS | Status: DC | PRN
  Administered 2015-11-23: 60 mg via INTRAVENOUS

## 2015-11-23 MED ORDER — PROPOFOL 10 MG/ML IV BOLUS
INTRAVENOUS | Status: AC
  Filled 2015-11-23: qty 20

## 2015-11-23 MED ORDER — ACETAMINOPHEN 650 MG RE SUPP
650.0000 mg | RECTAL | Status: DC | PRN
  Filled 2015-11-23: qty 1

## 2015-11-23 MED ORDER — HYDROCODONE-ACETAMINOPHEN 5-325 MG PO TABS: 1.0000 | ORAL_TABLET | Freq: Four times a day (QID) | ORAL | Status: DC | PRN

## 2015-11-23 MED ORDER — ACETAMINOPHEN 325 MG PO TABS
650.0000 mg | ORAL_TABLET | ORAL | Status: DC | PRN
  Filled 2015-11-23: qty 2

## 2015-11-23 MED ORDER — SUCCINYLCHOLINE CHLORIDE 20 MG/ML IJ SOLN
INTRAMUSCULAR | Status: AC
  Filled 2015-11-23: qty 1

## 2015-11-23 MED ORDER — EPHEDRINE SULFATE 50 MG/ML IJ SOLN
INTRAMUSCULAR | Status: AC
  Filled 2015-11-23: qty 1

## 2015-11-23 MED ORDER — FENTANYL CITRATE (PF) 250 MCG/5ML IJ SOLN
INTRAMUSCULAR | Status: AC
  Filled 2015-11-23: qty 5

## 2015-11-23 MED ORDER — ONDANSETRON HCL 4 MG/2ML IJ SOLN
INTRAMUSCULAR | Status: AC
  Filled 2015-11-23: qty 2

## 2015-11-23 MED ORDER — SODIUM CHLORIDE 0.9 % IV SOLN: INTRAVENOUS | Status: DC

## 2015-11-23 MED ORDER — LACTATED RINGERS IV SOLN
INTRAVENOUS | Status: DC | PRN
  Administered 2015-11-23: 07:00:00 via INTRAVENOUS

## 2015-11-23 MED ORDER — FENTANYL CITRATE (PF) 100 MCG/2ML IJ SOLN
INTRAMUSCULAR | Status: DC | PRN
  Administered 2015-11-23 (×3): 50 ug via INTRAVENOUS

## 2015-11-23 MED ORDER — BUPIVACAINE-EPINEPHRINE (PF) 0.25% -1:200000 IJ SOLN
INTRAMUSCULAR | Status: AC
  Filled 2015-11-23: qty 30

## 2015-11-23 MED ORDER — ONDANSETRON HCL 4 MG/2ML IJ SOLN: 4.0000 mg | Freq: Once | INTRAMUSCULAR | Status: DC | PRN

## 2015-11-23 MED ORDER — FENTANYL CITRATE (PF) 100 MCG/2ML IJ SOLN: 25.0000 ug | INTRAMUSCULAR | Status: DC | PRN

## 2015-11-23 MED ORDER — MIDAZOLAM HCL 2 MG/2ML IJ SOLN
INTRAMUSCULAR | Status: AC
  Filled 2015-11-23: qty 2

## 2015-11-23 MED ORDER — OXYCODONE HCL 5 MG PO TABS: 5.0000 mg | ORAL_TABLET | ORAL | Status: DC | PRN

## 2015-11-23 MED ORDER — LIDOCAINE HCL (CARDIAC) 20 MG/ML IV SOLN
INTRAVENOUS | Status: AC
  Filled 2015-11-23: qty 5

## 2015-11-23 MED ORDER — SODIUM CHLORIDE 0.9 % IJ SOLN
INTRAMUSCULAR | Status: AC
  Filled 2015-11-23: qty 10

## 2015-11-23 MED ORDER — BUPIVACAINE-EPINEPHRINE 0.25% -1:200000 IJ SOLN
INTRAMUSCULAR | Status: DC | PRN
Start: 2015-11-23 — End: 2015-11-23
  Administered 2015-11-23: 10 mL

## 2015-11-23 MED ORDER — ROCURONIUM BROMIDE 50 MG/5ML IV SOLN
INTRAVENOUS | Status: AC
  Filled 2015-11-23: qty 1

## 2015-11-23 MED ORDER — SODIUM CHLORIDE 0.9% FLUSH: 3.0000 mL | Freq: Two times a day (BID) | INTRAVENOUS | Status: DC

## 2015-11-23 SURGICAL SUPPLY — 48 items
APPLIER CLIP 9.375 MED OPEN (MISCELLANEOUS)
BINDER BREAST LRG (GAUZE/BANDAGES/DRESSINGS) IMPLANT
BINDER BREAST XLRG (GAUZE/BANDAGES/DRESSINGS) IMPLANT
BINDER BREAST XXLRG (GAUZE/BANDAGES/DRESSINGS) ×2 IMPLANT
BLADE SURG 15 STRL LF DISP TIS (BLADE) ×1 IMPLANT
BLADE SURG 15 STRL SS (BLADE) ×1
CANISTER SUCTION 2500CC (MISCELLANEOUS) ×2 IMPLANT
CHLORAPREP W/TINT 26ML (MISCELLANEOUS) ×2 IMPLANT
CLIP APPLIE 9.375 MED OPEN (MISCELLANEOUS) IMPLANT
COVER PROBE W GEL 5X96 (DRAPES) ×2 IMPLANT
COVER SURGICAL LIGHT HANDLE (MISCELLANEOUS) ×2 IMPLANT
DERMABOND ADVANCED (GAUZE/BANDAGES/DRESSINGS) ×1
DERMABOND ADVANCED .7 DNX12 (GAUZE/BANDAGES/DRESSINGS) ×1 IMPLANT
DEVICE DUBIN SPECIMEN MAMMOGRA (MISCELLANEOUS) ×2 IMPLANT
DRAPE CHEST BREAST 15X10 FENES (DRAPES) ×2 IMPLANT
DRAPE UTILITY XL STRL (DRAPES) ×2 IMPLANT
DRSG PAD ABDOMINAL 8X10 ST (GAUZE/BANDAGES/DRESSINGS) ×4 IMPLANT
ELECT CAUTERY BLADE 6.4 (BLADE) ×2 IMPLANT
ELECT REM PT RETURN 9FT ADLT (ELECTROSURGICAL) ×2
ELECTRODE REM PT RTRN 9FT ADLT (ELECTROSURGICAL) ×1 IMPLANT
GLOVE BIOGEL PI IND STRL 6 (GLOVE) ×1 IMPLANT
GLOVE BIOGEL PI IND STRL 7.0 (GLOVE) ×2 IMPLANT
GLOVE BIOGEL PI INDICATOR 6 (GLOVE) ×1
GLOVE BIOGEL PI INDICATOR 7.0 (GLOVE) ×2
GLOVE EUDERMIC 7 POWDERFREE (GLOVE) ×2 IMPLANT
GLOVE SURG SS PI 7.0 STRL IVOR (GLOVE) ×2 IMPLANT
GOWN STRL REUS W/ TWL LRG LVL3 (GOWN DISPOSABLE) ×1 IMPLANT
GOWN STRL REUS W/ TWL XL LVL3 (GOWN DISPOSABLE) ×1 IMPLANT
GOWN STRL REUS W/TWL LRG LVL3 (GOWN DISPOSABLE) ×1
GOWN STRL REUS W/TWL XL LVL3 (GOWN DISPOSABLE) ×1
KIT BASIN OR (CUSTOM PROCEDURE TRAY) ×2 IMPLANT
KIT MARKER MARGIN INK (KITS) ×2 IMPLANT
NEEDLE HYPO 25X1 1.5 SAFETY (NEEDLE) ×2 IMPLANT
NS IRRIG 1000ML POUR BTL (IV SOLUTION) IMPLANT
PACK SURGICAL SETUP 50X90 (CUSTOM PROCEDURE TRAY) ×2 IMPLANT
PENCIL BUTTON HOLSTER BLD 10FT (ELECTRODE) ×2 IMPLANT
SPONGE LAP 18X18 X RAY DECT (DISPOSABLE) ×4 IMPLANT
SUT MNCRL AB 4-0 PS2 18 (SUTURE) ×2 IMPLANT
SUT SILK 2 0 SH (SUTURE) ×2 IMPLANT
SUT VIC AB 2-0 CT1 27 (SUTURE) ×3
SUT VIC AB 2-0 CT1 TAPERPNT 27 (SUTURE) ×3 IMPLANT
SUT VIC AB 3-0 SH 18 (SUTURE) ×2 IMPLANT
SYR BULB 3OZ (MISCELLANEOUS) ×2 IMPLANT
SYR CONTROL 10ML LL (SYRINGE) ×2 IMPLANT
TOWEL OR 17X24 6PK STRL BLUE (TOWEL DISPOSABLE) ×2 IMPLANT
TOWEL OR 17X26 10 PK STRL BLUE (TOWEL DISPOSABLE) ×2 IMPLANT
TUBE CONNECTING 12X1/4 (SUCTIONS) ×2 IMPLANT
YANKAUER SUCT BULB TIP NO VENT (SUCTIONS) ×2 IMPLANT

## 2015-11-23 NOTE — Anesthesia Procedure Notes (Signed)
Procedure Name: LMA Insertion Date/Time: 11/23/2015 7:27 AM Performed by: Lance Coon Pre-anesthesia Checklist: Patient identified, Timeout performed, Emergency Drugs available, Suction available and Patient being monitored Patient Re-evaluated:Patient Re-evaluated prior to inductionOxygen Delivery Method: Circle system utilized Preoxygenation: Pre-oxygenation with 100% oxygen Intubation Type: IV induction LMA: LMA inserted LMA Size: 4.0 Placement Confirmation: positive ETCO2 and breath sounds checked- equal and bilateral Tube secured with: Tape Dental Injury: Teeth and Oropharynx as per pre-operative assessment

## 2015-11-23 NOTE — Interval H&P Note (Signed)
History and Physical Interval Note:  11/23/2015 7:07 AM  Dawn Davidson  has presented today for surgery, with the diagnosis of ADH LEFT BREAST  The various methods of treatment have been discussed with the patient and family. After consideration of risks, benefits and other options for treatment, the patient has consented to  Procedure(s): LEFT BREAST LUMPECTOMY WITH RADIOACTIVE SEED LOCALIZATION (Left) as a surgical intervention .  The patient's history has been reviewed, patient examined, no change in status, stable for surgery.  I have reviewed the patient's chart and labs.  Questions were answered to the patient's satisfaction.     [name]

## 2015-11-23 NOTE — Op Note (Signed)
Patient Name:           Alaysiah Browder   Date of Surgery:        11/23/2015  Pre op Diagnosis:      Atypical ductal hyperplasia left breast, lower outer quadrant  Post op Diagnosis:    Same  Procedure:                 Left breast lumpectomy with radioactive seed localization and margin assessment  Surgeon:                     Edsel Petrin. Dalbert Batman, M.D., FACS  Assistant:                      OR staff  Operative Indications:   . This is a 65 year old Caucasian female, referred to me by Margarette Canada at the [facility] [place] and [name] at [facility] for evaluation of atypical ductal hyperplasia left breast lower outer quadrant.    The patient has no prior history of breast problems. Recent mammograms performed on November 1 and July 21, 2015 showed 2 areas of calcifications in the left breast. A 5 mm area of calcifications medially and a 5 mm focus of calcifications laterally. Both of these areas were biopsied at the [facility]tr[facility] [place]. The area in the upper inner quadrant shows benign fibrocystic changes. The area and the lower outer quadrant of the left breast shows atypical ductal hyperplasia. In both of these areas the clips are 1.7 cm away from the index lesion.    Comorbidities include coronary artery disease with coronary stents placed in 2013. Open gastric bypass. Open cholecystectomy. Bilateral total knee replacement. Recent lumbar interbody fusion by Dr. Vertell Limber. Only anticoagulant is aspirin. Family history is negative for breast, ovarian, or pancreatic cancer. The patient is single. Has 2 children. Lives in [place].      She is scheduled for left breast lumpectomy, lower outer quadrant ADH area, with radioactive seed localization.   Operative Findings:       The radioactive seed was placed in the left breast recently by the breast center of [facility].  In the holding area I could hear the radioactive signal with the neoprobe in the lateral left  breast.  The breast are very large and the area is very posterior.  I removed a fairly generous specimen and I could hear the audible signal in the center of the specimen.  When I x-rayed the specimen I could see the marker clip in the center of the specimen but the radioactive seed was not present.  I found the radioactive seed in the lumpectomy cavity not attached to any surrounding tissue.  I then placed the seed back on the center of the specimen and took another specimen radiograph to document this.  I could hear the audible signal in the seed before passing it off the table.  I discussed this finding with Dr. Lyndon Code in pathology and Dr. Owens Shark in radiology.  Procedure in Detail:          Following the induction of general LMA anesthesia the patient's left breast was prepped and draped in a sterile fashion, intravenous antibiotics were given, and surgical timeout was performed.  0.5% Marcaine with epinephrine was used as local infiltration anesthetic.      I used the neoprobe to map out the area of lumpectomy.  This was deep within the left breast laterally midway between the areolar margin and  the lateral breast tissue margin.  A curvilinear circumareolar type of incision was made.  Dissection was carried down and her generously around the radioactive seed signal using the neoprobe as a guide frequently.  The tissue was removed and marked with silk sutures and a 6 color ink kit to orient the pathologist.  I found the radioactive seed free in the cavity and placed it with the specimen and the specimen mammogram was performed twice with findings as described above.  There was no radioactivity remaining in the wound.  The wound was irrigated with saline.  Hemostasis was excellent.  I closed the deeper tissues with 2-0 Vicryl.  I closed the superficial breast tissues with 2-0 Vicryl.  I closed the skin with a running subcuticular 4-0 Monocryl and Dermabond.  Breast binder was placed and the patient taken to PACU  in stable condition.  EBL 15 mL.  Counts correct.  Complications none.     Edsel Petrin. Dalbert Batman, M.D., FACS General and Minimally Invasive Surgery Breast and Colorectal Surgery  11/23/2015 8:27 AM

## 2015-11-23 NOTE — Transfer of Care (Signed)
Immediate Anesthesia Transfer of Care Note  Patient: Dawn Davidson  Procedure(s) Performed: Procedure(s): LEFT BREAST LUMPECTOMY WITH RADIOACTIVE SEED LOCALIZATION (Left)  Patient Location: PACU  Anesthesia Type:General  Level of Consciousness: awake, alert , oriented and patient cooperative  Airway & Oxygen Therapy: Patient Spontanous Breathing and Patient connected to nasal cannula oxygen  Post-op Assessment: Report given to RN, Post -op Vital signs reviewed and stable and Patient moving all extremities X 4  Post vital signs: Reviewed and stable  Last Vitals:  Filed Vitals:   11/23/15 0702  BP: 134/53  Pulse: 64  Temp: 36.9 C  Resp: 20    Complications: No apparent anesthesia complications

## 2015-11-23 NOTE — Anesthesia Preprocedure Evaluation (Signed)
Anesthesia Evaluation  Patient identified by MRN, date of birth, ID band Patient awake    Reviewed: Allergy & Precautions, NPO status , Patient's Chart, lab work & pertinent test results  History of Anesthesia Complications Negative for: history of anesthetic complications  Airway Mallampati: III  TM Distance: >3 FB Neck ROM: Full    Dental no notable dental hx. (+) Dental Advisory Given   Pulmonary neg pulmonary ROS,    Pulmonary exam normal breath sounds clear to auscultation       Cardiovascular hypertension, Pt. on medications + CAD, + Past MI and + Cardiac Stents (2010)  Normal cardiovascular exam Rhythm:Regular Rate:Normal  Cleared by Cardiology  Nuclear stress test 05/31/15: Nuclear stress EF: 65%. The left ventricular ejection fraction is normal (55-65%). No T wave inversion was noted during stress. There was no ST segment deviation noted during stress. This is a low risk study.   Neuro/Psych  Headaches, PSYCHIATRIC DISORDERS Anxiety Depression    GI/Hepatic Neg liver ROS, GERD  Medicated and Controlled,  Endo/Other  Hypothyroidism Morbid obesity  Renal/GU negative Renal ROS  negative genitourinary   Musculoskeletal  (+) Arthritis , Osteoarthritis,    Abdominal   Peds negative pediatric ROS (+)  Hematology negative hematology ROS (+)   Anesthesia Other Findings   Reproductive/Obstetrics negative OB ROS                             Anesthesia Physical Anesthesia Plan  ASA: III  Anesthesia Plan: General   Post-op Pain Management:    Induction: Intravenous  Airway Management Planned: LMA  Additional Equipment:   Intra-op Plan:   Post-operative Plan: Extubation in OR  Informed Consent: I have reviewed the patients History and Physical, chart, labs and discussed the procedure including the risks, benefits and alternatives for the proposed anesthesia with the patient or  authorized representative who has indicated his/her understanding and acceptance.   Dental advisory given  Plan Discussed with: CRNA  Anesthesia Plan Comments:         Anesthesia Quick Evaluation

## 2015-11-23 NOTE — Anesthesia Postprocedure Evaluation (Signed)
Anesthesia Post Note  Patient: Dawn Davidson  Procedure(s) Performed: Procedure(s) (LRB): LEFT BREAST LUMPECTOMY WITH RADIOACTIVE SEED LOCALIZATION (Left)  Patient location during evaluation: PACU Anesthesia Type: General Level of consciousness: awake and alert Pain management: pain level controlled Vital Signs Assessment: post-procedure vital signs reviewed and stable Respiratory status: spontaneous breathing, nonlabored ventilation, respiratory function stable and patient connected to nasal cannula oxygen Cardiovascular status: blood pressure returned to baseline and stable Postop Assessment: no signs of nausea or vomiting Anesthetic complications: no    Last Vitals:  Filed Vitals:   11/23/15 0835 11/23/15 0850  BP: 166/95 150/62  Pulse: 85 71  Temp: 36.9 C   Resp: 13 20    Last Pain:  Filed Vitals:   11/23/15 0904  PainSc: 0-No pain                 Trana Ressler JENNETTE

## 2015-11-23 NOTE — Discharge Instructions (Signed)
[facility] °Office Phone Number [phone] ° °BREAST BIOPSY/ PARTIAL MASTECTOMY: POST OP INSTRUCTIONS ° °Always review your discharge instruction sheet given to you by the facility where your surgery was performed. ° °IF YOU HAVE DISABILITY OR FAMILY LEAVE FORMS, YOU MUST BRING THEM TO THE OFFICE FOR PROCESSING.  DO NOT GIVE THEM TO YOUR DOCTOR. ° °1. A prescription for pain medication may be given to you upon discharge.  Take your pain medication as prescribed, if needed.  If narcotic pain medicine is not needed, then you may take acetaminophen (Tylenol) or ibuprofen (Advil) as needed. °2. Take your usually prescribed medications unless otherwise directed °3. If you need a refill on your pain medication, please contact your pharmacy.  They will contact our office to request authorization.  Prescriptions will not be filled after 5pm or on week-ends. °4. You should eat very light the first 24 hours after surgery, such as soup, crackers, pudding, etc.  Resume your normal diet the day after surgery. °5. Most patients will experience some swelling and bruising in the breast.  Ice packs and a good support bra will help.  Swelling and bruising can take several days to resolve.  °6. It is common to experience some constipation if taking pain medication after surgery.  Increasing fluid intake and taking a stool softener will usually help or prevent this problem from occurring.  A mild laxative (Milk of Magnesia or Miralax) should be taken according to package directions if there are no bowel movements after 48 hours. °7. Unless discharge instructions indicate otherwise, you may remove your bandages 24-48 hours after surgery, and you may shower at that time.  You may have steri-strips (small skin tapes) in place directly over the incision.  These strips should be left on the skin for 7-10 days.  If your surgeon used skin glue on the incision, you may shower in 24 hours.  The glue will flake off over the  next 2-3 weeks.  Any sutures or staples will be removed at the office during your follow-up visit. °8. ACTIVITIES:  You may resume regular daily activities (gradually increasing) beginning the next day.  Wearing a good support bra or sports bra minimizes pain and swelling.  You may have sexual intercourse when it is comfortable. °a. You may drive when you no longer are taking prescription pain medication, you can comfortably wear a seatbelt, and you can safely maneuver your car and apply brakes. °b. RETURN TO WORK:  ______________________________________________________________________________________ °9. You should see your doctor in the office for a follow-up appointment approximately two weeks after your surgery.  Your doctor’s nurse will typically make your follow-up appointment when she calls you with your pathology report.  Expect your pathology report 2-3 business days after your surgery.  You may call to check if you do not hear from us after three days. °10. OTHER INSTRUCTIONS: _______________________________________________________________________________________________ _____________________________________________________________________________________________________________________________________ °_____________________________________________________________________________________________________________________________________ °_____________________________________________________________________________________________________________________________________ ° °WHEN TO CALL YOUR DOCTOR: °1. Fever over 101.0 °2. Nausea and/or vomiting. °3. Extreme swelling or bruising. °4. Continued bleeding from incision. °5. Increased pain, redness, or drainage from the incision. ° °The clinic staff is available to answer your questions during regular business hours.  Please don’t hesitate to call and ask to speak to one of the nurses for clinical concerns.  If you have a medical emergency, go to the nearest  emergency room or call 911.  A surgeon from [facility] is always on call at the hospital. ° °For further questions, please visit [email or web address]  °

## 2015-11-24 ENCOUNTER — Encounter (HOSPITAL_COMMUNITY): Payer: Self-pay | Admitting: General Surgery

## 2015-11-24 NOTE — Progress Notes (Signed)
Quick Note:  Inform patient of Pathology report,.tell her that her breast biopsy shows benign fibrocystic changes. No cancer. Good news. I will discuss in detail with her at next office visit.  hmi ______

## 2016-01-18 DIAGNOSIS — F329 Major depressive disorder, single episode, unspecified: Secondary | ICD-10-CM | POA: Diagnosis not present

## 2016-01-18 DIAGNOSIS — M5416 Radiculopathy, lumbar region: Secondary | ICD-10-CM | POA: Diagnosis not present

## 2016-01-18 DIAGNOSIS — M549 Dorsalgia, unspecified: Secondary | ICD-10-CM | POA: Diagnosis not present

## 2016-03-09 DIAGNOSIS — M1611 Unilateral primary osteoarthritis, right hip: Secondary | ICD-10-CM | POA: Diagnosis not present

## 2016-03-09 DIAGNOSIS — Z96651 Presence of right artificial knee joint: Secondary | ICD-10-CM | POA: Diagnosis not present

## 2016-03-09 DIAGNOSIS — M1712 Unilateral primary osteoarthritis, left knee: Secondary | ICD-10-CM | POA: Diagnosis not present

## 2016-03-25 DIAGNOSIS — M1611 Unilateral primary osteoarthritis, right hip: Secondary | ICD-10-CM | POA: Diagnosis not present

## 2016-04-23 DIAGNOSIS — M1611 Unilateral primary osteoarthritis, right hip: Secondary | ICD-10-CM | POA: Diagnosis not present

## 2016-05-01 ENCOUNTER — Other Ambulatory Visit: Payer: Self-pay | Admitting: Family Medicine

## 2016-05-01 DIAGNOSIS — Z800 Family history of malignant neoplasm of digestive organs: Secondary | ICD-10-CM | POA: Diagnosis not present

## 2016-05-01 DIAGNOSIS — E538 Deficiency of other specified B group vitamins: Secondary | ICD-10-CM | POA: Diagnosis not present

## 2016-05-01 DIAGNOSIS — E1122 Type 2 diabetes mellitus with diabetic chronic kidney disease: Secondary | ICD-10-CM | POA: Diagnosis not present

## 2016-05-01 DIAGNOSIS — E039 Hypothyroidism, unspecified: Secondary | ICD-10-CM | POA: Diagnosis not present

## 2016-05-01 DIAGNOSIS — E041 Nontoxic single thyroid nodule: Secondary | ICD-10-CM

## 2016-05-01 DIAGNOSIS — R5383 Other fatigue: Secondary | ICD-10-CM | POA: Diagnosis not present

## 2016-05-01 DIAGNOSIS — R195 Other fecal abnormalities: Secondary | ICD-10-CM | POA: Diagnosis not present

## 2016-05-08 ENCOUNTER — Other Ambulatory Visit: Payer: Medicare Other

## 2016-05-14 ENCOUNTER — Ambulatory Visit
Admission: RE | Admit: 2016-05-14 | Discharge: 2016-05-14 | Disposition: A | Discharge status: Home / Self Care | Payer: Medicare Other | Source: Ambulatory Visit | Attending: Family Medicine | Admitting: Family Medicine

## 2016-05-14 DIAGNOSIS — E041 Nontoxic single thyroid nodule: Secondary | ICD-10-CM | POA: Diagnosis not present

## 2016-05-17 DIAGNOSIS — Z23 Encounter for immunization: Secondary | ICD-10-CM | POA: Diagnosis not present

## 2016-05-17 DIAGNOSIS — E538 Deficiency of other specified B group vitamins: Secondary | ICD-10-CM | POA: Diagnosis not present

## 2016-06-11 DIAGNOSIS — Z800 Family history of malignant neoplasm of digestive organs: Secondary | ICD-10-CM | POA: Diagnosis not present

## 2016-06-11 DIAGNOSIS — R197 Diarrhea, unspecified: Secondary | ICD-10-CM | POA: Diagnosis not present

## 2016-06-25 DIAGNOSIS — M1611 Unilateral primary osteoarthritis, right hip: Secondary | ICD-10-CM | POA: Diagnosis not present

## 2016-07-01 DIAGNOSIS — M150 Primary generalized (osteo)arthritis: Secondary | ICD-10-CM | POA: Diagnosis not present

## 2016-07-01 DIAGNOSIS — M2550 Pain in unspecified joint: Secondary | ICD-10-CM | POA: Diagnosis not present

## 2016-07-01 DIAGNOSIS — M797 Fibromyalgia: Secondary | ICD-10-CM | POA: Diagnosis not present

## 2016-07-10 DIAGNOSIS — K5730 Diverticulosis of large intestine without perforation or abscess without bleeding: Secondary | ICD-10-CM | POA: Diagnosis not present

## 2016-07-10 DIAGNOSIS — K635 Polyp of colon: Secondary | ICD-10-CM | POA: Diagnosis not present

## 2016-07-10 DIAGNOSIS — K5520 Angiodysplasia of colon without hemorrhage: Secondary | ICD-10-CM | POA: Diagnosis not present

## 2016-07-10 DIAGNOSIS — D124 Benign neoplasm of descending colon: Secondary | ICD-10-CM | POA: Diagnosis not present

## 2016-07-10 DIAGNOSIS — Z800 Family history of malignant neoplasm of digestive organs: Secondary | ICD-10-CM | POA: Diagnosis not present

## 2016-07-10 DIAGNOSIS — Z1211 Encounter for screening for malignant neoplasm of colon: Secondary | ICD-10-CM | POA: Diagnosis not present

## 2016-07-15 ENCOUNTER — Other Ambulatory Visit: Payer: Self-pay | Admitting: Family Medicine

## 2016-07-15 DIAGNOSIS — Z1231 Encounter for screening mammogram for malignant neoplasm of breast: Secondary | ICD-10-CM

## 2016-07-16 DIAGNOSIS — K635 Polyp of colon: Secondary | ICD-10-CM | POA: Diagnosis not present

## 2016-07-16 DIAGNOSIS — Z1211 Encounter for screening for malignant neoplasm of colon: Secondary | ICD-10-CM | POA: Diagnosis not present

## 2016-08-01 DIAGNOSIS — M1611 Unilateral primary osteoarthritis, right hip: Secondary | ICD-10-CM | POA: Diagnosis not present

## 2016-08-07 ENCOUNTER — Ambulatory Visit
Admission: RE | Admit: 2016-08-07 | Discharge: 2016-08-07 | Disposition: A | Discharge status: Home / Self Care | Payer: Medicare Other | Source: Ambulatory Visit | Attending: Family Medicine | Admitting: Family Medicine

## 2016-08-07 DIAGNOSIS — Z1231 Encounter for screening mammogram for malignant neoplasm of breast: Secondary | ICD-10-CM | POA: Diagnosis not present

## 2016-09-07 IMAGING — MG MM LT RADIOACTIVE SEED LOC MAMMO GUIDE
6 series · 6 of 6 positions shown · non-contrast
Comparison: Previous exam(s).

CLINICAL DATA: Left breast lower outer quadrant focus of atypical
ductal hyperplasia discovered on stereotactic core needle biopsy.

EXAM:
MAMMOGRAPHIC GUIDED RADIOACTIVE SEED LOCALIZATION OF THE LEFT BREAST

[L CC (1 of 3)]
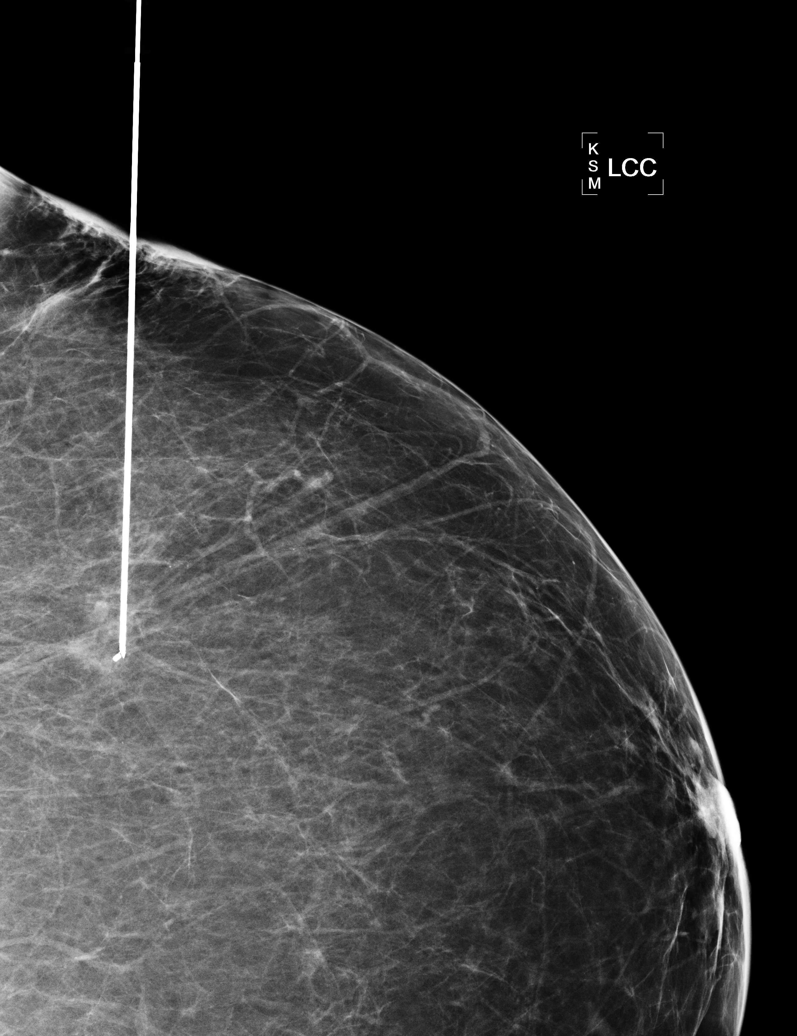

[L CC (2 of 3)]
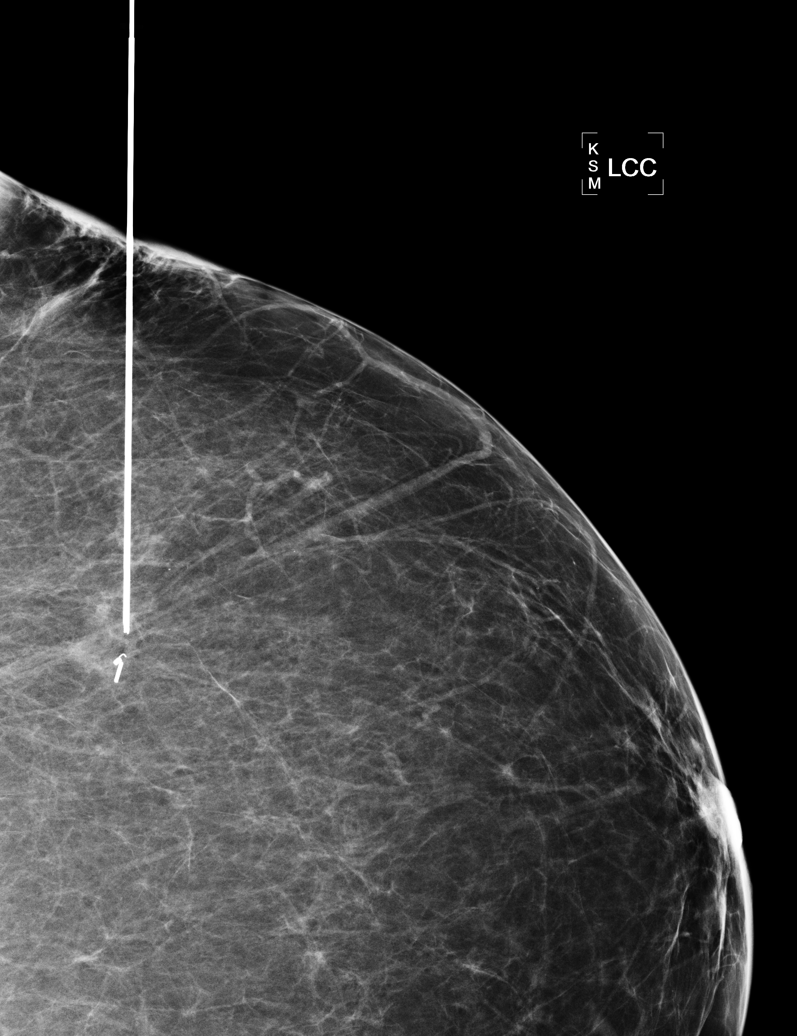

[L CC (3 of 3)]
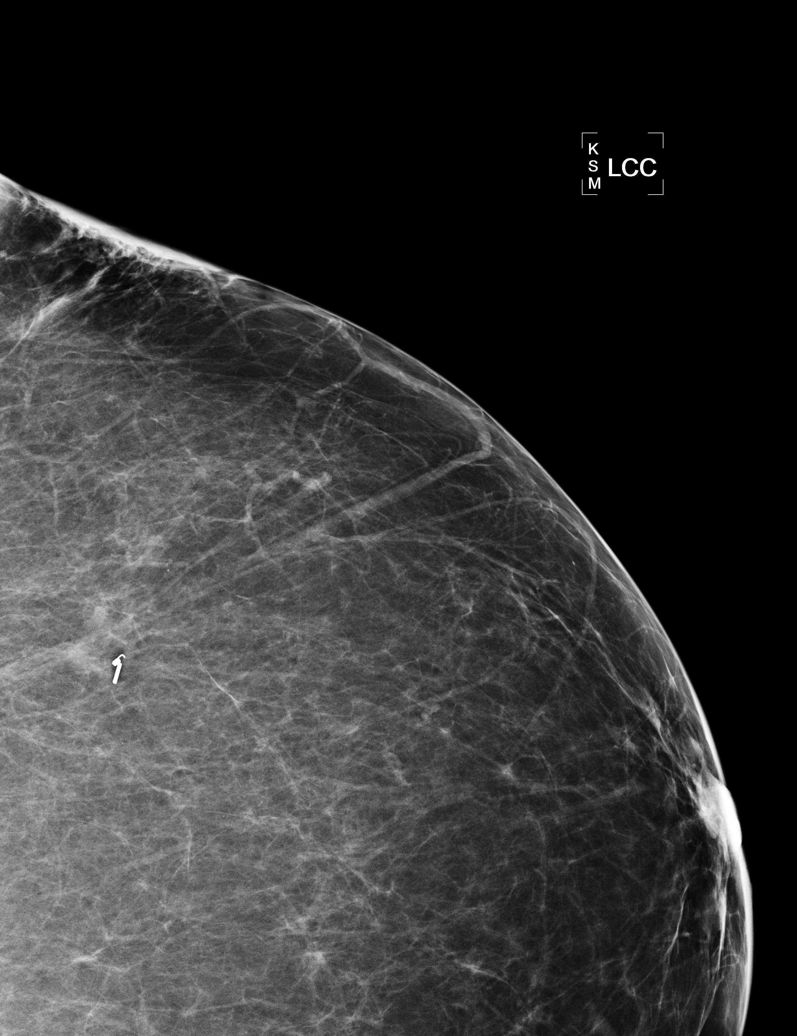

[L LM (1 of 3)]
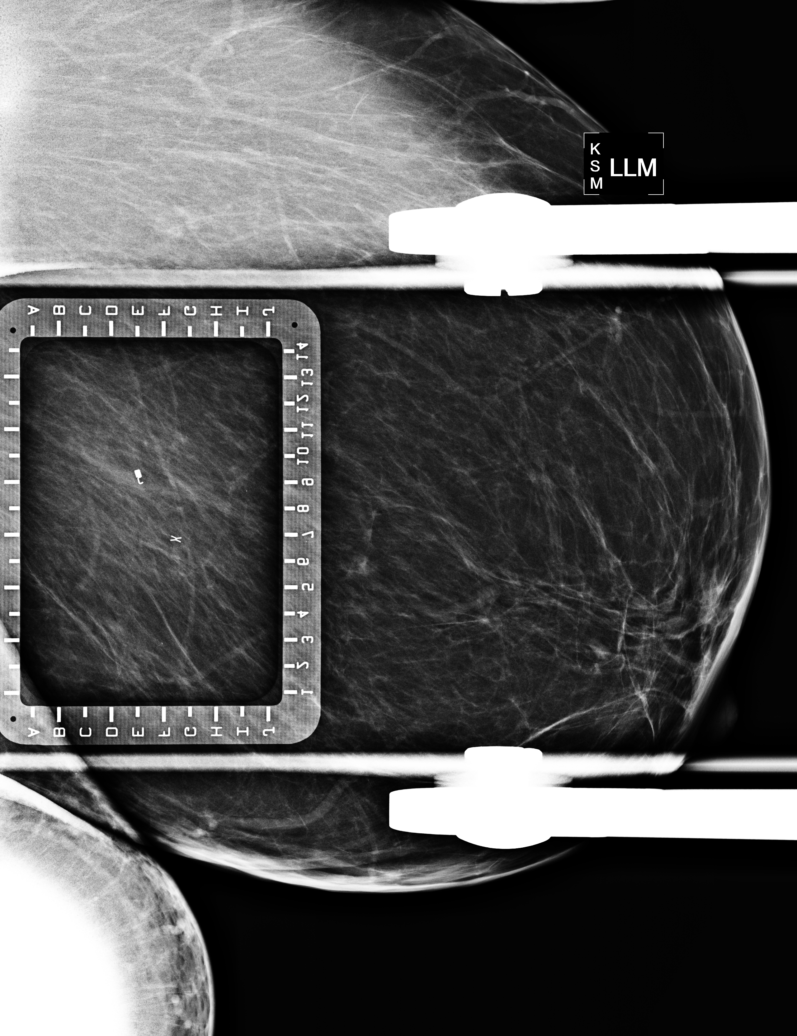

[L LM (2 of 3)]
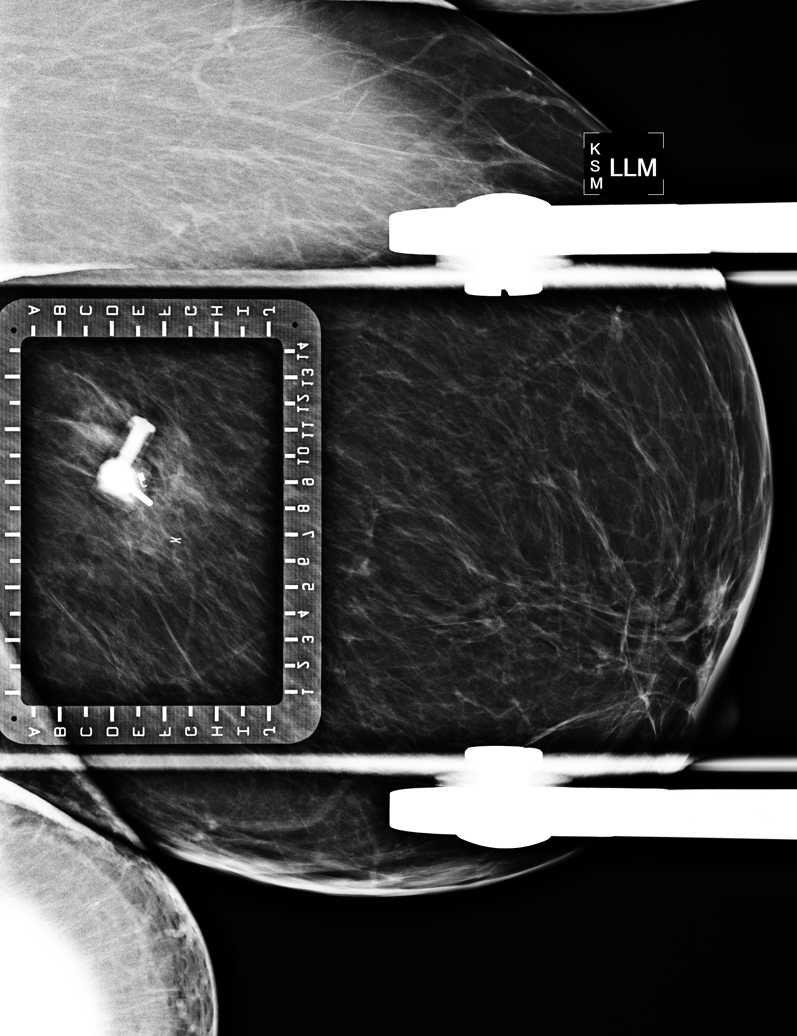

[L LM (3 of 3)]
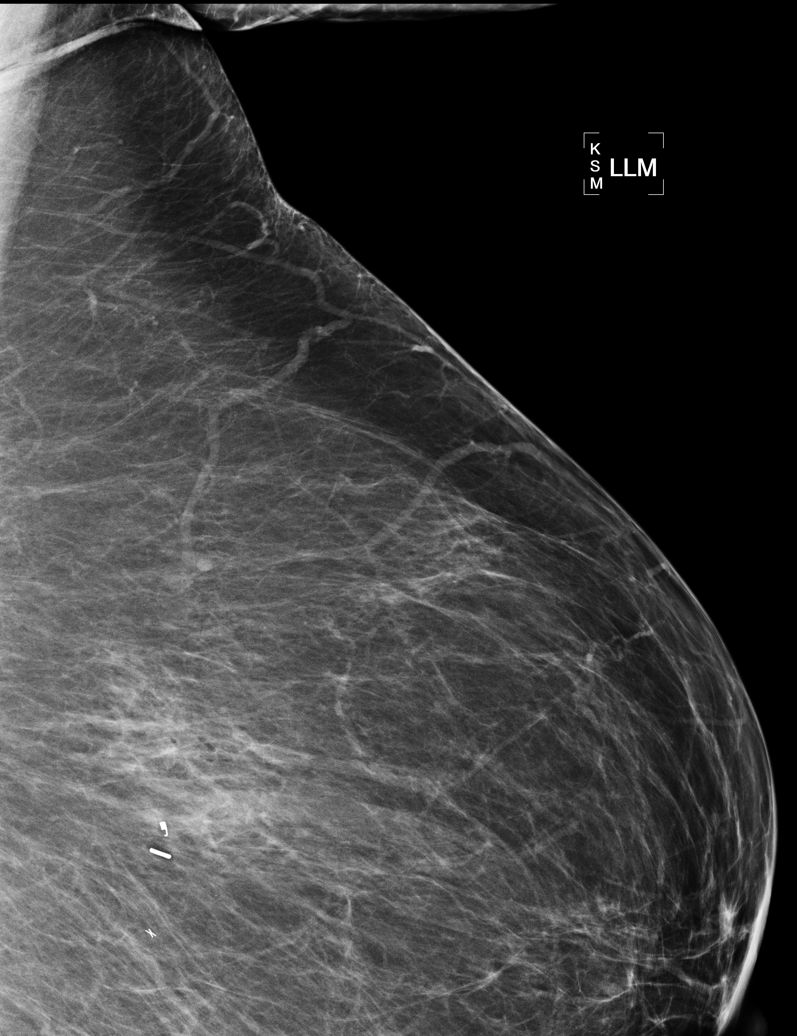

[6 of 6 positions shown; findings below may reference images not displayed]

FINDINGS: Patient presents for radioactive seed localization prior to left
breast excisional biopsy. I met with the patient and we discussed
the procedure of seed localization including benefits and
alternatives. We discussed the high likelihood of a successful
procedure. We discussed the risks of the procedure including
infection, bleeding, tissue injury and further surgery. We discussed
the low dose of radioactivity involved in the procedure. Informed,
written consent was given.

The usual time-out protocol was performed immediately prior to the
procedure.

Using mammographic guidance, sterile technique, 1% lidocaine and an
2-Q2B radioactive seed, coil shaped post biopsy tissue marker was
localized using a lateral approach. The follow-up mammogram images
confirm the seed in the expected location and were marked for Dr.
Rifai.

Follow-up survey of the patient confirms presence of the radioactive
seed.

Order number of 2-Q2B seed:  [id number].

Total activity:  0.250 millicurie  Reference Date:  October 26, 2015

The patient tolerated the procedure well and was released from the
[REDACTED]. She was given instructions regarding seed removal.
IMPRESSION: Radioactive seed localization left breast. No apparent
complications.

## 2016-10-02 ENCOUNTER — Ambulatory Visit (INDEPENDENT_AMBULATORY_CARE_PROVIDER_SITE_OTHER): Payer: Self-pay | Admitting: Orthopaedic Surgery

## 2016-10-02 ENCOUNTER — Ambulatory Visit (INDEPENDENT_AMBULATORY_CARE_PROVIDER_SITE_OTHER): Payer: Self-pay

## 2016-10-02 DIAGNOSIS — M1611 Unilateral primary osteoarthritis, right hip: Secondary | ICD-10-CM

## 2016-10-02 DIAGNOSIS — M25551 Pain in right hip: Secondary | ICD-10-CM

## 2016-10-02 MED ORDER — ACETAMINOPHEN-CODEINE #3 300-30 MG PO TABS: 1.0000 | ORAL_TABLET | Freq: Three times a day (TID) | ORAL | 0 refills | Status: DC | PRN

## 2016-10-02 NOTE — Progress Notes (Signed)
Office Visit Note   Patient: Dawn Davidson           Date of Birth: 1950-10-05           MRN: [id number] Visit Date: 10/02/2016              Requested by: Jonathon Jordan, MD [address] [place], [place] [address] PCP: Lilian Coma, MD   Assessment & Plan: Visit Diagnoses:  1. Pain in right hip   2. Unilateral primary osteoarthritis, right hip     Plan: At this point I would recommend a right total hip arthroplasty direct injury approach. When I later in a supine position was able to easily mobilize her abdominal tissue and found a very straightforward plane to get to her right hip. We had a long and thorough discussion about the risks and benefits of the surgery. She is not a diabetic and not a smoker. I showed her hip model and went through x-rays and explained in detail what the surgery involves. At this point having failed all forms conservative treatment and given the severity of her hip disease I feel that a total hip replacement. Decrease her pain and improve her mobility as well as improve her quality of life. All questions were encouraged and answered. We'll work on having the surgery scheduled and we will see her back in 2 weeks postoperative but no x-rays of be needed.  Follow-Up Instructions: Return for 2 weeks post-op.   Orders:  Orders Placed This Encounter  Procedures  . XR HIP UNILAT W OR W/O PELVIS 1V RIGHT   Meds ordered this encounter  Medications  . acetaminophen-codeine (TYLENOL #3) 300-30 MG tablet    Sig: Take 1-2 tablets by mouth every 8 (eight) hours as needed for moderate pain.    Dispense:  60 tablet    Refill:  0      Procedures: No procedures performed   Clinical Data: No additional findings.   Subjective: No chief complaint on file. The patient is actually very pleasant 66 year old female sent from another orthopedic colleague in town to evaluate her for a right total hip replacement. She is a body mass index is just  over 40 and he does not feel comfortable performing surgery on her but felt that I can potentially do this through direct anterior approach. Her pain is 10 out of 10. It is daily and the right hip. It is detrimentally affected her activities daily living, her quality of life, and her mobility. She was already with a rolling walker. She has tried and failed all other forms of at this point is quite frustrated very sad because of how she is having to do with this pain. Is been getting worse for last 2-1/2 years. She has had a previous right knee replacement.  HPI  Review of Systems She denies any chest pain, shortness of breath, fever, chills, nausea, vomiting or headache.  Objective: Vital Signs: There were no vitals taken for this visit.  Physical Exam She is alert and oriented 3 in no acute distress she and relates with a rolling walker. Ortho Exam She has severe pain with any attempts of internal/external rotation of her right hip. Her left hip is normally. She does have some clicking in her knee from a right total knee replacement. Specialty Comments:  No specialty comments available.  Imaging: Xr Hip Unilat W Or W/o Pelvis 1v Right  Result Date: 10/02/2016 An AP pelvis and lateral of her right hip  show severe end-stage arthritis. There is complete loss of the superior lateral joint space. There is cystic and scarring changes in both the femoral head and acetabulum. There is significant particular osteophytes as well.    PMFS History: Patient Active Problem List   Diagnosis Date Noted  . 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 Lumbar stenosis with neurogenic claudication 08/18/2015  . Morbid obesity ([place]) 05/10/2015   Past Medical History:  Diagnosis Date  . Anxiety   . Arthritis    "neck, back, hands, knees, hips" (08/18/2015)  . 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 Chronic lower back pain   . Coronary artery disease    2010 MI  .  Depression   . Family history of adverse reaction to anesthesia    her mother gets sick  . GERD (gastroesophageal reflux disease)   . Hyperlipidemia   . Hypertension   . Hypothyroidism since 02/2015  . Macular degeneration, bilateral   . Migraine    "q couple weeks recently" (08/18/2015)  . Morbid obesity ([place]) 05/10/2015  . Myocardial infarction 2012  . Temporary low platelet count ([place]) 2012   checked every 6 mths...kept her in for 1 week with MI, and she's not sure exactly why    Family History  Problem Relation Age of Onset  . Stomach cancer Mother   . Heart disease Sister   . Cancer Brother     X3 BROTHERS  . Bipolar disorder Daughter   . Bipolar disorder Daughter   . Thyroid disease Daughter   . Thyroid disease Grandchild     Past Surgical History:  Procedure Laterality Date  . BREAST BIOPSY Left 08/2015  . BREAST LUMPECTOMY WITH RADIOACTIVE SEED LOCALIZATION Left 11/23/2015   Procedure: LEFT BREAST LUMPECTOMY WITH RADIOACTIVE SEED LOCALIZATION;  Surgeon: Fanny Skates, MD;  Location: [facility];  Service: General;  Laterality: Left;  . CARDIAC CATHETERIZATION  2013  . CHOLECYSTECTOMY OPEN    . CORONARY ANGIOPLASTY WITH STENT PLACEMENT  2013   12/09/11 95% stenosis proximal RCA->DES; otherwise no significant CAD  . GASTRIC RESTRICTION SURGERY  1974  . MAXIMUM ACCESS (MAS)POSTERIOR LUMBAR INTERBODY FUSION (PLIF) 1 LEVEL N/A 08/18/2015   Procedure: Lumbar four-five Maximum access posterior lumbar interbody fusion;  Surgeon: Erline Levine, MD;  Location: [facility];  Service: Neurosurgery;  Laterality: N/A;  L4-5 Maximum access posterior lumbar interbody fusion  . REPLACEMENT TOTAL KNEE Right 2007  . TONSILLECTOMY     Social History   Occupational History  . Not on file.   Social History Main Topics  . Smoking status: Never Smoker  . Smokeless tobacco: Never Used  . Alcohol use No  . Drug use: No  . Sexual activity: No

## 2016-10-04 DIAGNOSIS — H5213 Myopia, bilateral: Secondary | ICD-10-CM | POA: Diagnosis not present

## 2016-10-14 ENCOUNTER — Telehealth (INDEPENDENT_AMBULATORY_CARE_PROVIDER_SITE_OTHER): Payer: Self-pay | Admitting: Orthopaedic Surgery

## 2016-10-14 DIAGNOSIS — H02831 Dermatochalasis of right upper eyelid: Secondary | ICD-10-CM | POA: Diagnosis not present

## 2016-10-14 DIAGNOSIS — H25812 Combined forms of age-related cataract, left eye: Secondary | ICD-10-CM | POA: Diagnosis not present

## 2016-10-14 DIAGNOSIS — H25811 Combined forms of age-related cataract, right eye: Secondary | ICD-10-CM | POA: Diagnosis not present

## 2016-10-14 NOTE — Telephone Encounter (Signed)
Patient was wondering if it okay for her to get eye surgery before her hip surgery scheduled in March. CB # [id number] or [phone]

## 2016-10-14 NOTE — Telephone Encounter (Signed)
Please advise 

## 2016-10-14 NOTE — Telephone Encounter (Signed)
I have no problem with that at all.  She should also ask her eye surgery though

## 2016-10-15 NOTE — Telephone Encounter (Signed)
LMOM for patient giving her the below message from [facility]

## 2016-10-17 NOTE — Progress Notes (Signed)
Please place orders in EPIC as patient is being scheduled for a Pre-op appointment with the nurse at [facility]! Thank you!

## 2016-10-21 ENCOUNTER — Other Ambulatory Visit (INDEPENDENT_AMBULATORY_CARE_PROVIDER_SITE_OTHER): Payer: Self-pay | Admitting: Physician Assistant

## 2016-10-23 ENCOUNTER — Encounter (HOSPITAL_COMMUNITY): Payer: Self-pay

## 2016-10-23 NOTE — Patient Instructions (Addendum)
Armony Weinreich  10/23/2016   Your procedure is scheduled on: 11/01/2016    Report to [facility] Main  Entrance take [place]  elevators to 3rd floor to  [place] at    [place] AM.  Call this number if you have problems the morning of surgery [phone]   Remember: ONLY 1 PERSON MAY GO WITH YOU TO SHORT STAY TO GET  READY MORNING OF [place].  Do not eat food or drink liquids :After Midnight.     Take these medicines the morning of surgery with A SIP OF WATER: Cymbalta, Synthroid, metoprolol (Lopressor), Omeprazole (Prilosec)                                 You may not have any metal on your body including hair pins and              piercings  Do not wear jewelry, make-up, lotions, powders or perfumes, deodorant             Do not wear nail polish.  Do not shave  48 hours prior to surgery.                 Do not bring valuables to the hospital. [place].  Contacts, dentures or bridgework may not be worn into surgery.  Leave suitcase in the car. After surgery it may be brought to your room.                       Please read over the following fact sheets you were given: _____________________________________________________________________             [facility] - Preparing for Surgery Before surgery, you can play an important role.  Because skin is not sterile, your skin needs to be as free of germs as possible.  You can reduce the number of germs on your skin by washing with CHG (chlorahexidine gluconate) soap before surgery.  CHG is an antiseptic cleaner which kills germs and bonds with the skin to continue killing germs even after washing. Please DO NOT use if you have an allergy to CHG or antibacterial soaps.  If your skin becomes reddened/irritated stop using the CHG and inform your nurse when you arrive at Short Stay. Do not shave (including legs and underarms) for at least 48 hours prior to  the first CHG shower.  You may shave your face/neck. Please follow these instructions carefully:  1.  Shower with CHG Soap the night before surgery and the  morning of Surgery.  2.  If you choose to wash your hair, wash your hair first as usual with your  normal  shampoo.  3.  After you shampoo, rinse your hair and body thoroughly to remove the  shampoo.                           4.  Use CHG as you would any other liquid soap.  You can apply chg directly  to the skin and wash                       Gently with a scrungie  or clean washcloth.  5.  Apply the CHG Soap to your body ONLY FROM THE NECK DOWN.   Do not use on face/ open                           Wound or open sores. Avoid contact with eyes, ears mouth and genitals (private parts).                       Wash face,  Genitals (private parts) with your normal soap.             6.  Wash thoroughly, paying special attention to the area where your surgery  will be performed.  7.  Thoroughly rinse your body with warm water from the neck down.  8.  DO NOT shower/wash with your normal soap after using and rinsing off  the CHG Soap.                9.  Pat yourself dry with a clean towel.            10.  Wear clean pajamas.            11.  Place clean sheets on your bed the night of your first shower and do not  sleep with pets. Day of Surgery : Do not apply any lotions/deodorants the morning of surgery.  Please wear clean clothes to the hospital/surgery center.  FAILURE TO FOLLOW THESE INSTRUCTIONS MAY RESULT IN THE CANCELLATION OF YOUR SURGERY PATIENT SIGNATURE_________________________________  NURSE SIGNATURE__________________________________  ________________________________________________________________________  WHAT IS A BLOOD TRANSFUSION? Blood Transfusion Information  A transfusion is the replacement of blood or some of its parts. Blood is made up of multiple cells which provide different functions.  Red blood cells carry oxygen and  are used for blood loss replacement.  White blood cells fight against infection.  Platelets control bleeding.  Plasma helps clot blood.  Other blood products are available for specialized needs, such as hemophilia or other clotting disorders. BEFORE THE TRANSFUSION  Who gives blood for transfusions?   Healthy volunteers who are fully evaluated to make sure their blood is safe. This is blood bank blood. Transfusion therapy is the safest it has ever been in the practice of medicine. Before blood is taken from a donor, a complete history is taken to make sure that person has no history of diseases nor engages in risky social behavior (examples are intravenous drug use or sexual activity with multiple partners). The donor's travel history is screened to minimize risk of transmitting infections, such as malaria. The donated blood is tested for signs of infectious diseases, such as HIV and hepatitis. The blood is then tested to be sure it is compatible with you in order to minimize the chance of a transfusion reaction. If you or a relative donates blood, this is often done in anticipation of surgery and is not appropriate for emergency situations. It takes many days to process the donated blood. RISKS AND COMPLICATIONS Although transfusion therapy is very safe and saves many lives, the main dangers of transfusion include:   Getting an infectious disease.  Developing a transfusion reaction. This is an allergic reaction to something in the blood you were given. Every precaution is taken to prevent this. The decision to have a blood transfusion has been considered carefully by your caregiver before blood is given. Blood is not given unless the benefits outweigh the risks. AFTER THE  TRANSFUSION  Right after receiving a blood transfusion, you will usually feel much better and more energetic. This is especially true if your red blood cells have gotten low (anemic). The transfusion raises the level of the  red blood cells which carry oxygen, and this usually causes an energy increase.  The nurse administering the transfusion will monitor you carefully for complications. HOME CARE INSTRUCTIONS  No special instructions are needed after a transfusion. You may find your energy is better. Speak with your caregiver about any limitations on activity for underlying diseases you may have. SEEK MEDICAL CARE IF:   Your condition is not improving after your transfusion.  You develop redness or irritation at the intravenous (IV) site. SEEK IMMEDIATE MEDICAL CARE IF:  Any of the following symptoms occur over the next 12 hours:  Shaking chills.  You have a temperature by mouth above 102 F (38.9 C), not controlled by medicine.  Chest, back, or muscle pain.  People around you feel you are not acting correctly or are confused.  Shortness of breath or difficulty breathing.  Dizziness and fainting.  You get a rash or develop hives.  You have a decrease in urine output.  Your urine turns a dark color or changes to pink, red, or brown. Any of the following symptoms occur over the next 10 days:  You have a temperature by mouth above 102 F (38.9 C), not controlled by medicine.  Shortness of breath.  Weakness after normal activity.  The white part of the eye turns yellow (jaundice).  You have a decrease in the amount of urine or are urinating less often.  Your urine turns a dark color or changes to pink, red, or brown. Document Released: 08/16/2000 Document Revised: 11/11/2011 Document Reviewed: 04/04/2008 ExitCare Patient Information 2014 [place].  _______________________________________________________________________  Incentive Spirometer  An incentive spirometer is a tool that can help keep your lungs clear and active. This tool measures how well you are filling your lungs with each breath. Taking long deep breaths may help reverse or decrease the chance of developing breathing  (pulmonary) problems (especially infection) following:  A long period of time when you are unable to move or be active. BEFORE THE PROCEDURE   If the spirometer includes an indicator to show your best effort, your nurse or respiratory therapist will set it to a desired goal.  If possible, sit up straight or lean slightly forward. Try not to slouch.  Hold the incentive spirometer in an upright position. INSTRUCTIONS FOR USE  1. Sit on the edge of your bed if possible, or sit up as far as you can in bed or on a chair. 2. Hold the incentive spirometer in an upright position. 3. Breathe out normally. 4. Place the mouthpiece in your mouth and seal your lips tightly around it. 5. Breathe in slowly and as deeply as possible, raising the piston or the ball toward the top of the column. 6. Hold your breath for 3-5 seconds or for as long as possible. Allow the piston or ball to fall to the bottom of the column. 7. Remove the mouthpiece from your mouth and breathe out normally. 8. Rest for a few seconds and repeat Steps 1 through 7 at least 10 times every 1-2 hours when you are awake. Take your time and take a few normal breaths between deep breaths. 9. The spirometer may include an indicator to show your best effort. Use the indicator as a goal to work toward during each repetition.  10. After each set of 10 deep breaths, practice coughing to be sure your lungs are clear. If you have an incision (the cut made at the time of surgery), support your incision when coughing by placing a pillow or rolled up towels firmly against it. Once you are able to get out of bed, walk around indoors and cough well. You may stop using the incentive spirometer when instructed by your caregiver.  RISKS AND COMPLICATIONS  Take your time so you do not get dizzy or light-headed.  If you are in pain, you may need to take or ask for pain medication before doing incentive spirometry. It is harder to take a deep breath if you  are having pain. AFTER USE  Rest and breathe slowly and easily.  It can be helpful to keep track of a log of your progress. Your caregiver can provide you with a simple table to help with this. If you are using the spirometer at home, follow these instructions: [place] IF:   You are having difficultly using the spirometer.  You have trouble using the spirometer as often as instructed.  Your pain medication is not giving enough relief while using the spirometer.  You develop fever of 100.5 F (38.1 C) or higher. SEEK IMMEDIATE MEDICAL CARE IF:   You cough up bloody sputum that had not been present before.  You develop fever of 102 F (38.9 C) or greater.  You develop worsening pain at or near the incision site. MAKE SURE YOU:   Understand these instructions.  Will watch your condition.  Will get help right away if you are not doing well or get worse. Document Released: 12/30/2006 Document Revised: 11/11/2011 Document Reviewed: 03/02/2007 [facility] Patient Information 2014 [place], [place].   ________________________________________________________________________

## 2016-10-25 ENCOUNTER — Encounter (HOSPITAL_COMMUNITY): Payer: Self-pay

## 2016-10-25 ENCOUNTER — Encounter (HOSPITAL_COMMUNITY)
Admission: RE | Admit: 2016-10-25 | Discharge: 2016-10-25 | Disposition: A | Discharge status: Home / Self Care | Payer: Medicare HMO | Source: Ambulatory Visit | Attending: Orthopaedic Surgery | Admitting: Orthopaedic Surgery

## 2016-10-25 DIAGNOSIS — Z01812 Encounter for preprocedural laboratory examination: Secondary | ICD-10-CM | POA: Diagnosis not present

## 2016-10-25 DIAGNOSIS — I10 Essential (primary) hypertension: Secondary | ICD-10-CM | POA: Diagnosis not present

## 2016-10-25 DIAGNOSIS — Z01810 Encounter for preprocedural cardiovascular examination: Secondary | ICD-10-CM | POA: Insufficient documentation

## 2016-10-25 LAB — CBC
HCT: 39.5 % (ref 36.0–46.0)
Hemoglobin: 13 g/dL (ref 12.0–15.0)
MCH: 27.2 pg (ref 26.0–34.0)
MCHC: 32.9 g/dL (ref 30.0–36.0)
MCV: 82.6 fL (ref 78.0–100.0)
Platelets: 178 10*3/uL (ref 150–400)
RBC: 4.78 MIL/uL (ref 3.87–5.11)
RDW: 13.8 % (ref 11.5–15.5)
WBC: 7.2 10*3/uL (ref 4.0–10.5)

## 2016-10-25 LAB — BASIC METABOLIC PANEL
Anion gap: 6 (ref 5–15)
BUN: 15 mg/dL (ref 6–20)
CO2: 27 mmol/L (ref 22–32)
Calcium: 9.4 mg/dL (ref 8.9–10.3)
Chloride: 106 mmol/L (ref 101–111)
Creatinine, Ser: 1.08 mg/dL — ABNORMAL HIGH (ref 0.44–1.00)
GFR calc Af Amer: >60 mL/min (ref >60)
GFR calc non Af Amer: 53 mL/min — ABNORMAL LOW (ref >60)
Glucose, Bld: 107 mg/dL — ABNORMAL HIGH (ref 65–99)
Potassium: 3.8 mmol/L (ref 3.5–5.1)
Sodium: 139 mmol/L (ref 135–145)

## 2016-10-25 LAB — SURGICAL PCR SCREEN
MRSA, PCR: NEGATIVE
Staphylococcus aureus: POSITIVE — AB

## 2016-10-25 NOTE — Pre-Procedure Instructions (Signed)
Instructed pt to contact Dr. Trevor Mace office regarding Meloxicam and Aspirin prior to surgery.  Pt states she had MI 2007 and saw cardiologist for 5 years, but has not visited cardiologist since 2016 when she needed to be cleared for back surgery.

## 2016-10-28 NOTE — Progress Notes (Signed)
Final EKG done 10/25/16- epic  

## 2016-10-31 MED ORDER — DEXTROSE 5 % IV SOLN
3.0000 g | INTRAVENOUS | Status: AC
  Administered 2016-11-01: 3 g via INTRAVENOUS
  Filled 2016-10-31: qty 3
  Filled 2016-10-31: qty 3000

## 2016-11-01 ENCOUNTER — Inpatient Hospital Stay (HOSPITAL_COMMUNITY): Payer: Medicare HMO | Admitting: Certified Registered Nurse Anesthetist

## 2016-11-01 ENCOUNTER — Encounter (HOSPITAL_COMMUNITY)
Admission: RE | Disposition: A | Discharge status: Home Health | Payer: Self-pay | Source: Ambulatory Visit | Attending: Orthopaedic Surgery

## 2016-11-01 ENCOUNTER — Inpatient Hospital Stay (HOSPITAL_COMMUNITY): Payer: Medicare HMO

## 2016-11-01 ENCOUNTER — Inpatient Hospital Stay (HOSPITAL_COMMUNITY)
Admission: RE | Admit: 2016-11-01 | Discharge: 2016-11-03 | DRG: 470 | Disposition: A | Discharge status: Home Health | Payer: Medicare HMO | Source: Ambulatory Visit | Attending: Orthopaedic Surgery | Admitting: Orthopaedic Surgery

## 2016-11-01 ENCOUNTER — Encounter (HOSPITAL_COMMUNITY): Payer: Self-pay | Admitting: *Deleted

## 2016-11-01 DIAGNOSIS — E785 Hyperlipidemia, unspecified: Secondary | ICD-10-CM | POA: Diagnosis present

## 2016-11-01 DIAGNOSIS — Z8249 Family history of ischemic heart disease and other diseases of the circulatory system: Secondary | ICD-10-CM

## 2016-11-01 DIAGNOSIS — Z6841 Body Mass Index (BMI) 40.0 and over, adult: Secondary | ICD-10-CM | POA: Diagnosis not present

## 2016-11-01 DIAGNOSIS — Z882 Allergy status to sulfonamides status: Secondary | ICD-10-CM | POA: Diagnosis not present

## 2016-11-01 DIAGNOSIS — Z95818 Presence of other cardiac implants and grafts: Secondary | ICD-10-CM

## 2016-11-01 DIAGNOSIS — R51 Headache: Secondary | ICD-10-CM | POA: Diagnosis not present

## 2016-11-01 DIAGNOSIS — Z79891 Long term (current) use of opiate analgesic: Secondary | ICD-10-CM | POA: Diagnosis not present

## 2016-11-01 DIAGNOSIS — Z419 Encounter for procedure for purposes other than remedying health state, unspecified: Secondary | ICD-10-CM

## 2016-11-01 DIAGNOSIS — K219 Gastro-esophageal reflux disease without esophagitis: Secondary | ICD-10-CM | POA: Diagnosis present

## 2016-11-01 DIAGNOSIS — I10 Essential (primary) hypertension: Secondary | ICD-10-CM | POA: Diagnosis present

## 2016-11-01 DIAGNOSIS — N6092 Unspecified benign mammary dysplasia of left breast: Secondary | ICD-10-CM | POA: Diagnosis present

## 2016-11-01 DIAGNOSIS — Z96651 Presence of right artificial knee joint: Secondary | ICD-10-CM | POA: Diagnosis present

## 2016-11-01 DIAGNOSIS — Z96641 Presence of right artificial hip joint: Secondary | ICD-10-CM

## 2016-11-01 DIAGNOSIS — F418 Other specified anxiety disorders: Secondary | ICD-10-CM | POA: Diagnosis not present

## 2016-11-01 DIAGNOSIS — I252 Old myocardial infarction: Secondary | ICD-10-CM

## 2016-11-01 DIAGNOSIS — Z791 Long term (current) use of non-steroidal anti-inflammatories (NSAID): Secondary | ICD-10-CM

## 2016-11-01 DIAGNOSIS — I2510 Atherosclerotic heart disease of native coronary artery without angina pectoris: Secondary | ICD-10-CM | POA: Diagnosis present

## 2016-11-01 DIAGNOSIS — Z800 Family history of malignant neoplasm of digestive organs: Secondary | ICD-10-CM

## 2016-11-01 DIAGNOSIS — Z7982 Long term (current) use of aspirin: Secondary | ICD-10-CM | POA: Diagnosis not present

## 2016-11-01 DIAGNOSIS — H3530 Unspecified macular degeneration: Secondary | ICD-10-CM | POA: Diagnosis present

## 2016-11-01 DIAGNOSIS — M19042 Primary osteoarthritis, left hand: Secondary | ICD-10-CM | POA: Diagnosis not present

## 2016-11-01 DIAGNOSIS — G8929 Other chronic pain: Secondary | ICD-10-CM | POA: Diagnosis not present

## 2016-11-01 DIAGNOSIS — Z8349 Family history of other endocrine, nutritional and metabolic diseases: Secondary | ICD-10-CM

## 2016-11-01 DIAGNOSIS — M1611 Unilateral primary osteoarthritis, right hip: Secondary | ICD-10-CM | POA: Diagnosis not present

## 2016-11-01 DIAGNOSIS — M19041 Primary osteoarthritis, right hand: Secondary | ICD-10-CM | POA: Diagnosis present

## 2016-11-01 DIAGNOSIS — Z79899 Other long term (current) drug therapy: Secondary | ICD-10-CM

## 2016-11-01 DIAGNOSIS — G43909 Migraine, unspecified, not intractable, without status migrainosus: Secondary | ICD-10-CM | POA: Diagnosis present

## 2016-11-01 DIAGNOSIS — Z981 Arthrodesis status: Secondary | ICD-10-CM

## 2016-11-01 DIAGNOSIS — M24562 Contracture, left knee: Secondary | ICD-10-CM | POA: Diagnosis present

## 2016-11-01 DIAGNOSIS — M48062 Spinal stenosis, lumbar region with neurogenic claudication: Secondary | ICD-10-CM | POA: Diagnosis not present

## 2016-11-01 DIAGNOSIS — E039 Hypothyroidism, unspecified: Secondary | ICD-10-CM | POA: Diagnosis not present

## 2016-11-01 DIAGNOSIS — Z818 Family history of other mental and behavioral disorders: Secondary | ICD-10-CM

## 2016-11-01 HISTORY — PX: TOTAL HIP ARTHROPLASTY: SHX124

## 2016-11-01 LAB — TYPE AND SCREEN
ABO/RH(D): A POS
Antibody Screen: NEGATIVE

## 2016-11-01 SURGERY — ARTHROPLASTY, HIP, TOTAL, ANTERIOR APPROACH
Anesthesia: Monitor Anesthesia Care | Site: Hip | Laterality: Right

## 2016-11-01 MED ORDER — OXYCODONE HCL 5 MG PO TABS
5.0000 mg | ORAL_TABLET | ORAL | Status: DC | PRN
  Administered 2016-11-01 (×2): 10 mg via ORAL
  Administered 2016-11-02 – 2016-11-03 (×4): 5 mg via ORAL
  Filled 2016-11-01: qty 2
  Filled 2016-11-01 (×4): qty 1
  Filled 2016-11-01: qty 2

## 2016-11-01 MED ORDER — TRANEXAMIC ACID 1000 MG/10ML IV SOLN
1000.0000 mg | INTRAVENOUS | Status: AC
  Administered 2016-11-01: 1000 mg via INTRAVENOUS
  Filled 2016-11-01: qty 1100

## 2016-11-01 MED ORDER — PHENYLEPHRINE HCL 10 MG/ML IJ SOLN
INTRAMUSCULAR | Status: AC
  Filled 2016-11-01: qty 1

## 2016-11-01 MED ORDER — TOPIRAMATE 25 MG PO TABS
50.0000 mg | ORAL_TABLET | Freq: Every day | ORAL | Status: DC
  Administered 2016-11-01 – 2016-11-02 (×2): 50 mg via ORAL
  Filled 2016-11-01 (×2): qty 2

## 2016-11-01 MED ORDER — PROMETHAZINE HCL 25 MG/ML IJ SOLN: 6.2500 mg | INTRAMUSCULAR | Status: DC | PRN

## 2016-11-01 MED ORDER — MIDAZOLAM HCL 2 MG/2ML IJ SOLN
INTRAMUSCULAR | Status: AC
  Filled 2016-11-01: qty 2

## 2016-11-01 MED ORDER — DOCUSATE SODIUM 100 MG PO CAPS
100.0000 mg | ORAL_CAPSULE | Freq: Two times a day (BID) | ORAL | Status: DC
  Administered 2016-11-01 – 2016-11-03 (×5): 100 mg via ORAL
  Filled 2016-11-01 (×5): qty 1

## 2016-11-01 MED ORDER — SODIUM CHLORIDE 0.9 % IV SOLN
INTRAVENOUS | Status: DC | PRN
  Administered 2016-11-01: 50 ug/min via INTRAVENOUS

## 2016-11-01 MED ORDER — PANTOPRAZOLE SODIUM 40 MG PO TBEC
40.0000 mg | DELAYED_RELEASE_TABLET | Freq: Every day | ORAL | Status: DC
Start: 2016-11-02 — End: 2016-11-03
  Administered 2016-11-02 – 2016-11-03 (×2): 40 mg via ORAL
  Filled 2016-11-01 (×2): qty 1

## 2016-11-01 MED ORDER — PROPOFOL 10 MG/ML IV BOLUS
INTRAVENOUS | Status: AC
  Filled 2016-11-01: qty 40

## 2016-11-01 MED ORDER — METOPROLOL TARTRATE 25 MG PO TABS
25.0000 mg | ORAL_TABLET | Freq: Two times a day (BID) | ORAL | Status: DC
  Administered 2016-11-01 – 2016-11-02 (×3): 25 mg via ORAL
  Filled 2016-11-01 (×5): qty 1

## 2016-11-01 MED ORDER — HYDROMORPHONE HCL 1 MG/ML IJ SOLN
0.2500 mg | INTRAMUSCULAR | Status: DC | PRN
  Administered 2016-11-01 (×2): 0.25 mg via INTRAVENOUS

## 2016-11-01 MED ORDER — ACETAMINOPHEN 650 MG RE SUPP: 650.0000 mg | Freq: Four times a day (QID) | RECTAL | Status: DC | PRN

## 2016-11-01 MED ORDER — KETOROLAC TROMETHAMINE 15 MG/ML IJ SOLN
7.5000 mg | Freq: Four times a day (QID) | INTRAMUSCULAR | Status: AC
  Administered 2016-11-01 – 2016-11-02 (×4): 7.5 mg via INTRAVENOUS
  Filled 2016-11-01 (×4): qty 1

## 2016-11-01 MED ORDER — ATORVASTATIN CALCIUM 20 MG PO TABS
40.0000 mg | ORAL_TABLET | Freq: Every day | ORAL | Status: DC
  Administered 2016-11-02 – 2016-11-03 (×2): 40 mg via ORAL
  Filled 2016-11-01 (×2): qty 2

## 2016-11-01 MED ORDER — ONDANSETRON HCL 4 MG/2ML IJ SOLN: 4.0000 mg | Freq: Four times a day (QID) | INTRAMUSCULAR | Status: DC | PRN

## 2016-11-01 MED ORDER — LACTATED RINGERS IV SOLN
INTRAVENOUS | Status: DC
  Administered 2016-11-01 (×2): via INTRAVENOUS

## 2016-11-01 MED ORDER — SUCCINYLCHOLINE CHLORIDE 200 MG/10ML IV SOSY
PREFILLED_SYRINGE | INTRAVENOUS | Status: AC
  Filled 2016-11-01: qty 10

## 2016-11-01 MED ORDER — DULOXETINE HCL 60 MG PO CPEP
60.0000 mg | ORAL_CAPSULE | Freq: Two times a day (BID) | ORAL | Status: DC
  Administered 2016-11-01 – 2016-11-03 (×4): 60 mg via ORAL
  Filled 2016-11-01 (×4): qty 1

## 2016-11-01 MED ORDER — PHENOL 1.4 % MT LIQD
1.0000 | OROMUCOSAL | Status: DC | PRN
  Filled 2016-11-01: qty 177

## 2016-11-01 MED ORDER — LIDOCAINE 2% (20 MG/ML) 5 ML SYRINGE
INTRAMUSCULAR | Status: AC
  Filled 2016-11-01: qty 5

## 2016-11-01 MED ORDER — PROPOFOL 10 MG/ML IV BOLUS
INTRAVENOUS | Status: DC | PRN
  Administered 2016-11-01 (×4): 10 mg via INTRAVENOUS

## 2016-11-01 MED ORDER — METHOCARBAMOL 1000 MG/10ML IJ SOLN
500.0000 mg | Freq: Four times a day (QID) | INTRAMUSCULAR | Status: DC | PRN
  Administered 2016-11-01: 500 mg via INTRAVENOUS
  Filled 2016-11-01: qty 5
  Filled 2016-11-01: qty 550

## 2016-11-01 MED ORDER — SODIUM CHLORIDE 0.9 % IV SOLN
INTRAVENOUS | Status: DC
  Administered 2016-11-01: 14:00:00 via INTRAVENOUS

## 2016-11-01 MED ORDER — CEFAZOLIN SODIUM-DEXTROSE 2-4 GM/100ML-% IV SOLN
2.0000 g | Freq: Four times a day (QID) | INTRAVENOUS | Status: AC
  Administered 2016-11-01 (×2): 2 g via INTRAVENOUS
  Filled 2016-11-01 (×2): qty 100

## 2016-11-01 MED ORDER — HYDROMORPHONE HCL 1 MG/ML IJ SOLN
INTRAMUSCULAR | Status: AC
  Administered 2016-11-01: 0.25 mg via INTRAVENOUS
  Filled 2016-11-01: qty 1

## 2016-11-01 MED ORDER — PROPOFOL 10 MG/ML IV BOLUS
INTRAVENOUS | Status: AC
  Filled 2016-11-01: qty 20

## 2016-11-01 MED ORDER — TRAZODONE HCL 50 MG PO TABS
100.0000 mg | ORAL_TABLET | Freq: Every day | ORAL | Status: DC
  Administered 2016-11-01 – 2016-11-02 (×2): 100 mg via ORAL
  Filled 2016-11-01 (×2): qty 2

## 2016-11-01 MED ORDER — ROCURONIUM BROMIDE 50 MG/5ML IV SOSY
PREFILLED_SYRINGE | INTRAVENOUS | Status: AC
  Filled 2016-11-01: qty 5

## 2016-11-01 MED ORDER — ONDANSETRON HCL 4 MG/2ML IJ SOLN
INTRAMUSCULAR | Status: DC | PRN
  Administered 2016-11-01: 4 mg via INTRAVENOUS

## 2016-11-01 MED ORDER — LEVOTHYROXINE SODIUM 50 MCG PO TABS
50.0000 ug | ORAL_TABLET | Freq: Every day | ORAL | Status: DC
  Administered 2016-11-02 – 2016-11-03 (×2): 50 ug via ORAL
  Filled 2016-11-01 (×2): qty 1

## 2016-11-01 MED ORDER — METHOCARBAMOL 500 MG PO TABS
500.0000 mg | ORAL_TABLET | Freq: Four times a day (QID) | ORAL | Status: DC | PRN
  Administered 2016-11-02 – 2016-11-03 (×4): 500 mg via ORAL
  Filled 2016-11-01 (×4): qty 1

## 2016-11-01 MED ORDER — BUPIVACAINE IN DEXTROSE 0.75-8.25 % IT SOLN
INTRATHECAL | Status: DC | PRN
  Administered 2016-11-01: 15 mL via INTRATHECAL

## 2016-11-01 MED ORDER — DIPHENHYDRAMINE HCL 12.5 MG/5ML PO ELIX: 12.5000 mg | ORAL_SOLUTION | ORAL | Status: DC | PRN

## 2016-11-01 MED ORDER — METOCLOPRAMIDE HCL 5 MG PO TABS: 5.0000 mg | ORAL_TABLET | Freq: Three times a day (TID) | ORAL | Status: DC | PRN

## 2016-11-01 MED ORDER — EPHEDRINE SULFATE 50 MG/ML IJ SOLN
INTRAMUSCULAR | Status: DC | PRN
  Administered 2016-11-01 (×3): 10 mg via INTRAVENOUS

## 2016-11-01 MED ORDER — CHLORHEXIDINE GLUCONATE 4 % EX LIQD: 60.0000 mL | Freq: Once | CUTANEOUS | Status: DC

## 2016-11-01 MED ORDER — STERILE WATER FOR IRRIGATION IR SOLN
Status: DC | PRN
  Administered 2016-11-01: 2000 mL

## 2016-11-01 MED ORDER — VITAMIN D3 25 MCG (1000 UNIT) PO TABS
1000.0000 [IU] | ORAL_TABLET | Freq: Every day | ORAL | Status: DC
  Administered 2016-11-01 – 2016-11-03 (×3): 1000 [IU] via ORAL
  Filled 2016-11-01 (×3): qty 1

## 2016-11-01 MED ORDER — EPHEDRINE 5 MG/ML INJ
INTRAVENOUS | Status: AC
  Filled 2016-11-01: qty 10

## 2016-11-01 MED ORDER — MIDAZOLAM HCL 5 MG/5ML IJ SOLN
INTRAMUSCULAR | Status: DC | PRN
  Administered 2016-11-01: 2 mg via INTRAVENOUS

## 2016-11-01 MED ORDER — METOCLOPRAMIDE HCL 5 MG/ML IJ SOLN: 5.0000 mg | Freq: Three times a day (TID) | INTRAMUSCULAR | Status: DC | PRN

## 2016-11-01 MED ORDER — ASPIRIN 81 MG PO CHEW
81.0000 mg | CHEWABLE_TABLET | Freq: Two times a day (BID) | ORAL | Status: DC
  Administered 2016-11-01 – 2016-11-03 (×4): 81 mg via ORAL
  Filled 2016-11-01 (×4): qty 1

## 2016-11-01 MED ORDER — SODIUM CHLORIDE 0.9 % IR SOLN
Status: DC | PRN
  Administered 2016-11-01: 1000 mL

## 2016-11-01 MED ORDER — ONDANSETRON HCL 4 MG/2ML IJ SOLN
INTRAMUSCULAR | Status: AC
  Filled 2016-11-01: qty 2

## 2016-11-01 MED ORDER — PROPOFOL 500 MG/50ML IV EMUL
INTRAVENOUS | Status: DC | PRN
  Administered 2016-11-01: 125 ug/kg/min via INTRAVENOUS

## 2016-11-01 MED ORDER — HYDROMORPHONE HCL 1 MG/ML IJ SOLN: 1.0000 mg | INTRAMUSCULAR | Status: DC | PRN

## 2016-11-01 MED ORDER — ALUM & MAG HYDROXIDE-SIMETH 200-200-20 MG/5ML PO SUSP: 30.0000 mL | ORAL | Status: DC | PRN

## 2016-11-01 MED ORDER — ACETAMINOPHEN 325 MG PO TABS: 650.0000 mg | ORAL_TABLET | Freq: Four times a day (QID) | ORAL | Status: DC | PRN

## 2016-11-01 MED ORDER — MENTHOL 3 MG MT LOZG: 1.0000 | LOZENGE | OROMUCOSAL | Status: DC | PRN

## 2016-11-01 MED ORDER — ONDANSETRON HCL 4 MG PO TABS: 4.0000 mg | ORAL_TABLET | Freq: Four times a day (QID) | ORAL | Status: DC | PRN

## 2016-11-01 SURGICAL SUPPLY — 40 items
BAG ZIPLOCK 12X15 (MISCELLANEOUS) ×2 IMPLANT
BLADE SAW SGTL 18X1.27X75 (BLADE) ×2 IMPLANT
CAPT HIP TOTAL 2 ×2 IMPLANT
CELLS DAT CNTRL 66122 CELL SVR (MISCELLANEOUS) ×1 IMPLANT
CLOTH BEACON ORANGE TIMEOUT ST (SAFETY) ×2 IMPLANT
COVER PERINEAL POST (MISCELLANEOUS) ×2 IMPLANT
DRAPE STERI IOBAN 125X83 (DRAPES) ×2 IMPLANT
DRAPE U-SHAPE 47X51 STRL (DRAPES) ×4 IMPLANT
DRSG AQUACEL AG ADV 3.5X10 (GAUZE/BANDAGES/DRESSINGS) ×2 IMPLANT
DURAPREP 26ML APPLICATOR (WOUND CARE) ×2 IMPLANT
ELECT REM PT RETURN 9FT ADLT (ELECTROSURGICAL) ×2
ELECTRODE REM PT RTRN 9FT ADLT (ELECTROSURGICAL) ×1 IMPLANT
GAUZE XEROFORM 1X8 LF (GAUZE/BANDAGES/DRESSINGS) ×2 IMPLANT
GLOVE BIO SURGEON STRL SZ7.5 (GLOVE) ×2 IMPLANT
GLOVE BIOGEL PI IND STRL 6.5 (GLOVE) ×1 IMPLANT
GLOVE BIOGEL PI IND STRL 7.5 (GLOVE) ×3 IMPLANT
GLOVE BIOGEL PI IND STRL 8 (GLOVE) ×2 IMPLANT
GLOVE BIOGEL PI INDICATOR 6.5 (GLOVE) ×1
GLOVE BIOGEL PI INDICATOR 7.5 (GLOVE) ×3
GLOVE BIOGEL PI INDICATOR 8 (GLOVE) ×2
GLOVE ECLIPSE 8.0 STRL XLNG CF (GLOVE) ×2 IMPLANT
GLOVE SURG ORTHO 7.0 STRL STRW (GLOVE) ×2 IMPLANT
GLOVE SURG SS PI 7.5 STRL IVOR (GLOVE) ×2 IMPLANT
GOWN SPEC L3 XXLG W/TWL (GOWN DISPOSABLE) ×2 IMPLANT
GOWN STRL REUS W/TWL LRG LVL3 (GOWN DISPOSABLE) ×2 IMPLANT
GOWN STRL REUS W/TWL XL LVL3 (GOWN DISPOSABLE) ×4 IMPLANT
HANDPIECE INTERPULSE COAX TIP (DISPOSABLE) ×1
HOLDER FOLEY CATH W/STRAP (MISCELLANEOUS) ×2 IMPLANT
PACK ANTERIOR HIP CUSTOM (KITS) ×2 IMPLANT
RTRCTR WOUND ALEXIS 18CM MED (MISCELLANEOUS) ×2
SET HNDPC FAN SPRY TIP SCT (DISPOSABLE) ×1 IMPLANT
STAPLER VISISTAT 35W (STAPLE) ×2 IMPLANT
SUT ETHIBOND NAB CT1 #1 30IN (SUTURE) ×2 IMPLANT
SUT MNCRL AB 4-0 PS2 18 (SUTURE) IMPLANT
SUT VIC AB 0 CT1 36 (SUTURE) ×2 IMPLANT
SUT VIC AB 1 CT1 36 (SUTURE) ×2 IMPLANT
SUT VIC AB 2-0 CT1 27 (SUTURE) ×2
SUT VIC AB 2-0 CT1 TAPERPNT 27 (SUTURE) ×2 IMPLANT
TRAY FOLEY CATH SILVER 14FR (SET/KITS/TRAYS/PACK) ×2 IMPLANT
YANKAUER SUCT BULB TIP 10FT TU (MISCELLANEOUS) ×2 IMPLANT

## 2016-11-01 NOTE — Evaluation (Signed)
Physical Therapy Evaluation Patient Details Name: Dawn Davidson MRN: [id number] DOB: 1951/08/11 Today's Date: 11/01/2016   History of Present Illness  Pt s/p R THR and with hx of R TKR (07), CAD and MI  Clinical Impression  Pt s/p R THR and presents with decreased R LE strength/ROM and post op pain limiting functional mobility.  Pt should progress to dc home with assist of family/friends.    Follow Up Recommendations Home health PT    Equipment Recommendations  None recommended by PT    Recommendations for Other Services OT consult     Precautions / Restrictions Precautions Precautions: Fall Restrictions Weight Bearing Restrictions: No Other Position/Activity Restrictions: WBAT      Mobility  Bed Mobility Overal bed mobility: Needs Assistance Bed Mobility: Supine to Sit     Supine to sit: Min assist;+2 for physical assistance;+2 for safety/equipment     General bed mobility comments: cues for sequence and use of L LE to self assist.   Transfers Overall transfer level: Needs assistance Equipment used: Rolling walker (2 wheeled) Transfers: Sit to/from Stand Sit to Stand: Min assist;Mod assist;+2 safety/equipment         General transfer comment: cues for LE management and use of UEs to self assist  Ambulation/Gait Ambulation/Gait assistance: Min assist;+2 safety/equipment Ambulation Distance (Feet): 55 Feet Assistive device: Rolling walker (2 wheeled) Gait Pattern/deviations: Step-to pattern;Decreased step length - right;Decreased step length - left;Shuffle;Trunk flexed Gait velocity: decr Gait velocity interpretation: Below normal speed for age/gender General Gait Details: cues for sequence, posture and position from [employer]            Wheelchair Mobility    Modified Rankin (Stroke Patients Only)       Balance                                             Pertinent Vitals/Pain Pain Assessment: 0-10 Pain Score: 2  Pain  Location: R hip Pain Descriptors / Indicators: Aching;Sore Pain Intervention(s): Limited activity within patient's tolerance;Monitored during session;Premedicated before session;Ice applied    Home Living Family/patient expects to be discharged to:: Private residence Living Arrangements: Other (Comment);Non-relatives/Friends (27 yr old grandson lives with pt) Available Help at Discharge: Family;Friend(s) Type of Home: House Home Access: Stairs to enter   [employer] of Steps: 1 Home Layout: Two level Home Equipment: Environmental consultant - 2 wheels;Walker - 4 wheels;Cane - quad      Prior Function Level of Independence: Independent with assistive device(s)         Comments: using RW prior to surgery     Hand Dominance        Extremity/Trunk Assessment   Upper Extremity Assessment Upper Extremity Assessment: Overall WFL for tasks assessed    Lower Extremity Assessment Lower Extremity Assessment: RLE deficits/detail;LLE deficits/detail LLE Deficits / Details: Limited L knee flex       Communication   Communication: No difficulties  Cognition Arousal/Alertness: Awake/alert Behavior During Therapy: WFL for tasks assessed/performed Overall Cognitive Status: Within Functional Limits for tasks assessed                      General Comments      Exercises Total Joint Exercises Ankle Circles/Pumps: AROM;Both;15 reps;Supine   Assessment/Plan    PT Assessment Patient needs continued PT services  PT Problem List Decreased strength;Decreased range of motion;Decreased  activity tolerance;Decreased mobility;Decreased knowledge of use of DME;Obesity;Pain       PT Treatment Interventions DME instruction;Gait training;Stair training;Functional mobility training;Therapeutic activities;Therapeutic exercise;Patient/family education    PT Goals (Current goals can be found in the Care Plan section)  Acute Rehab PT Goals Patient Stated Goal: Walk without pain PT Goal  Formulation: With patient Time For Goal Achievement: 11/03/16 Potential to Achieve Goals: Good    Frequency 7X/week   Barriers to discharge        Co-evaluation               End of Session   Activity Tolerance: Patient tolerated treatment well Patient left: in chair;with call bell/phone within reach Nurse Communication: Mobility status PT Visit Diagnosis: Difficulty in walking, not elsewhere classified (R26.2)         Time: [id number] PT Time Calculation (min) (ACUTE ONLY): 23 min   Charges:   PT Evaluation $PT Eval Low Complexity: 1 Procedure PT Treatments $Gait Training: 8-22 mins   PT G Codes:         Aleksandr Pellow 11/01/2016, 5:21 PM

## 2016-11-01 NOTE — Anesthesia Procedure Notes (Addendum)
Spinal  Patient location during procedure: OR Start time: 11/01/2016 9:38 AM End time: 11/01/2016 9:48 AM Staffing Anesthesiologist: Duane Boston Performed: anesthesiologist  Preanesthetic Checklist Completed: patient identified, surgical consent, pre-op evaluation, timeout performed, IV checked, risks and benefits discussed and monitors and equipment checked Spinal Block Patient position: sitting Prep: DuraPrep Patient monitoring: cardiac monitor, continuous pulse ox and blood pressure Approach: midline Location: L2-3 Injection technique: single-shot Needle Needle type: Quincke  Needle gauge: 22 G Needle length: 9 cm Additional Notes Functioning IV was confirmed and monitors were applied. Sterile prep and drape, including hand hygiene and sterile gloves were used. The patient was positioned and the spine was prepped. The skin was anesthetized with lidocaine.  Free flow of clear CSF was obtained prior to injecting local anesthetic into the CSF.  The spinal needle aspirated freely following injection.  The needle was carefully withdrawn.  The patient tolerated the procedure well. Difficult placement.

## 2016-11-01 NOTE — Transfer of Care (Signed)
Immediate Anesthesia Transfer of Care Note  Patient: Dawn Davidson  Procedure(s) Performed: Procedure(s): RIGHT TOTAL HIP ARTHROPLASTY ANTERIOR APPROACH (Right)  Patient Location: PACU  Anesthesia Type:Spinal  Level of Consciousness:  sedated, patient cooperative and responds to stimulation  Airway & Oxygen Therapy:Patient Spontanous Breathing and Patient connected to face mask oxgen  Post-op Assessment:  Report given to PACU RN and Post -op Vital signs reviewed and stable  Post vital signs:  Reviewed and stable  Last Vitals:  Vitals:   11/01/16 0759  BP: (!) 160/41  Pulse: 68  Resp: 16  Temp: [id number] C    Complications: No apparent anesthesia complications

## 2016-11-01 NOTE — Anesthesia Procedure Notes (Signed)
Procedure Name: MAC Performed by: Maxwell Caul Pre-anesthesia Checklist: Patient being monitored, Suction available and Emergency Drugs available Oxygen Delivery Method: Simple face mask

## 2016-11-01 NOTE — H&P (Signed)
TOTAL HIP ADMISSION H&P  Patient is admitted for right total hip arthroplasty.  Subjective:  Chief Complaint: right hip pain  HPI: Dawn Davidson, 66 y.o. female, has a history of pain and functional disability in the right hip(s) due to arthritis and patient has failed non-surgical conservative treatments for greater than 12 weeks to include NSAID's and/or analgesics, corticosteriod injections, flexibility and strengthening excercises, use of assistive devices, weight reduction as appropriate and activity modification.  Onset of symptoms was gradual starting 3 years ago with gradually worsening course since that time.The patient noted no past surgery on the right hip(s).  Patient currently rates pain in the right hip at 10 out of 10 with activity. Patient has night pain, worsening of pain with activity and weight bearing, trendelenberg gait, pain that interfers with activities of daily living, pain with passive range of motion and crepitus. Patient has evidence of subchondral cysts, subchondral sclerosis, periarticular osteophytes and joint space narrowing by imaging studies. This condition presents safety issues increasing the risk of falls.  There is no current active infection.  Patient Active Problem List   Diagnosis Date Noted  . Unilateral primary osteoarthritis, right hip 11/01/2016  . 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 Lumbar stenosis with neurogenic claudication 08/18/2015  . Morbid obesity ([place]) 05/10/2015   Past Medical History:  Diagnosis Date  . Anxiety   . Arthritis    "neck, back, hands, knees, hips" (08/18/2015)  . 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 Chronic lower back pain   . Coronary artery disease    2010 MI  . Depression   . Family history of adverse reaction to anesthesia    her mother gets sick  . GERD (gastroesophageal reflux disease)   . Hyperlipidemia   . Hypertension   . Hypothyroidism since 02/2015  . Macular  degeneration, bilateral   . Migraine    "q couple weeks recently" (08/18/2015)  . Morbid obesity ([place]) 05/10/2015  . Myocardial infarction 2012  . Temporary low platelet count ([place]) 2012   checked every 6 mths...kept her in for 1 week with MI, and she's not sure exactly why    Past Surgical History:  Procedure Laterality Date  . BREAST BIOPSY Left 08/2015  . BREAST LUMPECTOMY WITH RADIOACTIVE SEED LOCALIZATION Left 11/23/2015   Procedure: LEFT BREAST LUMPECTOMY WITH RADIOACTIVE SEED LOCALIZATION;  Surgeon: Fanny Skates, MD;  Location: [facility];  Service: General;  Laterality: Left;  . CARDIAC CATHETERIZATION  2013  . CHOLECYSTECTOMY OPEN    . CORONARY ANGIOPLASTY WITH STENT PLACEMENT  2013   12/09/11 95% stenosis proximal RCA->DES; otherwise no significant CAD  . GASTRIC RESTRICTION SURGERY  1974  . JOINT REPLACEMENT Right    knee  . MAXIMUM ACCESS (MAS)POSTERIOR LUMBAR INTERBODY FUSION (PLIF) 1 LEVEL N/A 08/18/2015   Procedure: Lumbar four-five Maximum access posterior lumbar interbody fusion;  Surgeon: Erline Levine, MD;  Location: [facility];  Service: Neurosurgery;  Laterality: N/A;  L4-5 Maximum access posterior lumbar interbody fusion  . REPLACEMENT TOTAL KNEE Right 2007  . TONSILLECTOMY      Prescriptions Prior to Admission  Medication Sig Dispense Refill Last Dose  . acetaminophen-codeine (TYLENOL #3) 300-30 MG tablet Take 1-2 tablets by mouth every 8 (eight) hours as needed for moderate pain. (Patient taking differently: Take 1 tablet by mouth every 8 (eight) hours as needed for moderate pain. ) 60 tablet 0 Past Week at Unknown time  . aspirin EC 81 MG tablet Take  1 tablet (81 mg total) by mouth daily.   10/28/2016  . atorvastatin (LIPITOR) 40 MG tablet Take 40 mg by mouth daily.   3 10/31/2016 at Unknown time  . cholecalciferol (VITAMIN D) 1000 UNITS tablet Take 1,000 Units by mouth daily.   10/31/2016 at Unknown time  . DULoxetine (CYMBALTA) 60 MG capsule Take 60 mg by mouth 2 (two)  times daily.   10/31/2016 at Unknown time  . HYDROcodone-acetaminophen (NORCO) 5-325 MG tablet Take 1-2 tablets by mouth every 6 (six) hours as needed for moderate pain or severe pain. 30 tablet 0 10/31/2016 at Unknown time  . levothyroxine (SYNTHROID, LEVOTHROID) 50 MCG tablet Take 50 mcg by mouth daily before breakfast.   2 11/01/2016 at 0600  . meloxicam (MOBIC) 15 MG tablet Take 15 mg by mouth daily.    10/25/2016  . metoprolol tartrate (LOPRESSOR) 25 MG tablet Take 25 mg by mouth 2 (two) times daily.    11/01/2016 at 0600  . omeprazole (PRILOSEC) 20 MG capsule Take 20 mg by mouth daily.   11/01/2016 at 0600  . topiramate (TOPAMAX) 50 MG tablet Take 50 mg by mouth at bedtime.   10/31/2016 at Unknown time  . traZODone (DESYREL) 100 MG tablet Take 100 mg by mouth at bedtime.   10/31/2016 at Unknown time  . nitroGLYCERIN (NITROSTAT) 0.4 MG SL tablet Place 0.4 mg under the tongue every 5 (five) minutes as needed for chest pain. Reported on 10/09/2015   Not Taking  . traMADol (ULTRAM) 50 MG tablet Take 50 mg by mouth every 12 (twelve) hours as needed for moderate pain.    Unknown at Unknown time   Allergies  Allergen Reactions  . Sulfa Antibiotics Shortness Of Breath    Social History  Substance Use Topics  . Smoking status: Never Smoker  . Smokeless tobacco: Never Used  . Alcohol use No    Family History  Problem Relation Age of Onset  . Stomach cancer Mother   . Heart disease Sister   . Cancer Brother     X3 BROTHERS  . Bipolar disorder Daughter   . Bipolar disorder Daughter   . Thyroid disease Daughter   . Thyroid disease Grandchild      Review of Systems  Musculoskeletal: Positive for joint pain.  All other systems reviewed and are negative.   Objective:  Physical Exam  Constitutional: She is oriented to person, place, and time. She appears well-developed and well-nourished.  HENT:  Head: Normocephalic and atraumatic.  Eyes: EOM are normal. Pupils are equal, round, and reactive to  light.  Neck: Normal range of motion. Neck supple.  Cardiovascular: Regular rhythm.   Respiratory: Effort normal and breath sounds normal.  GI: Soft. Bowel sounds are normal.  Musculoskeletal:       Right hip: She exhibits decreased range of motion, decreased strength, tenderness and bony tenderness.  Neurological: She is alert and oriented to person, place, and time.  Skin: Skin is warm and dry.  Psychiatric: She has a normal mood and affect.    Vital signs in last 24 hours: Temp:  [97.6 F (36.4 C)] 97.6 F (36.4 C) (03/02 0759) Pulse Rate:  [68] 68 (03/02 0759) Resp:  [16] 16 (03/02 0759) BP: (160)/(41) 160/41 (03/02 0759) SpO2:  [98 %] 98 % (03/02 0759) Weight:  [273 lb (123.8 kg)] 273 lb (123.8 kg) (03/02 0837)  Labs:   Estimated body mass index is 46.86 kg/m as calculated from the following:   Height as of  this encounter: 5\' 4"  (1.626 m).   Weight as of this encounter: 273 lb (123.8 kg).   Imaging Review Plain radiographs demonstrate severe degenerative joint disease of the right hip(s). The bone quality appears to be good for age and reported activity level.  Assessment/Plan:  End stage arthritis, right hip(s)  The patient history, physical examination, clinical judgement of the provider and imaging studies are consistent with end stage degenerative joint disease of the right hip(s) and total hip arthroplasty is deemed medically necessary. The treatment options including medical management, injection therapy, arthroscopy and arthroplasty were discussed at length. The risks and benefits of total hip arthroplasty were presented and reviewed. The risks due to aseptic loosening, infection, stiffness, dislocation/subluxation,  thromboembolic complications and other imponderables were discussed.  The patient acknowledged the explanation, agreed to proceed with the plan and consent was signed. Patient is being admitted for inpatient treatment for surgery, pain control, PT, OT,  prophylactic antibiotics, VTE prophylaxis, progressive ambulation and ADL's and discharge planning.The patient is planning to be discharged home with home health services

## 2016-11-01 NOTE — Brief Op Note (Signed)
11/01/2016  11:08 AM  PATIENT:  Dawn Davidson  66 y.o. female  PRE-OPERATIVE DIAGNOSIS:  Osteoarthritis right hip  POST-OPERATIVE DIAGNOSIS:  Osteoarthritis right hip  PROCEDURE:  Procedure(s): RIGHT TOTAL HIP ARTHROPLASTY ANTERIOR APPROACH (Right)  SURGEON:  Surgeon(s) and Role:    * Mcarthur Rossetti, MD - Primary  PHYSICIAN ASSISTANT: Benita Stabile, PA-C  ANESTHESIA:   spinal  EBL:  Total I/O In: 1000 [I.V.:1000] Out: 150 [Blood:150]  COUNTS:  YES   DICTATION: .Other Dictation: Dictation Number [id number]  PLAN OF CARE: Admit to inpatient   PATIENT DISPOSITION:  PACU - hemodynamically stable.   Delay start of Pharmacological VTE agent (>24hrs) due to surgical blood loss or risk of bleeding: no

## 2016-11-01 NOTE — Anesthesia Postprocedure Evaluation (Signed)
Anesthesia Post Note  Patient: Dawn Davidson  Procedure(s) Performed: Procedure(s) (LRB): RIGHT TOTAL HIP ARTHROPLASTY ANTERIOR APPROACH (Right)  Patient location during evaluation: PACU Anesthesia Type: MAC Level of consciousness: awake and alert Pain management: pain level controlled Vital Signs Assessment: post-procedure vital signs reviewed and stable Respiratory status: spontaneous breathing and respiratory function stable Cardiovascular status: blood pressure returned to baseline and stable Postop Assessment: spinal receding Anesthetic complications: no       Last Vitals:  Vitals:   11/01/16 1230 11/01/16 1240  BP: (!) 149/58 (!) 125/55  Pulse: 80 77  Resp: 18 17  Temp: 36.5 C 36.3 C    Last Pain:  Vitals:   11/01/16 1240  TempSrc:   PainSc: 0-No pain                 Noorah Giammona DANIEL

## 2016-11-01 NOTE — Anesthesia Preprocedure Evaluation (Addendum)
Anesthesia Evaluation  Patient identified by MRN, date of birth, ID band Patient awake    Reviewed: Allergy & Precautions, NPO status , Patient's Chart, lab work & pertinent test results  History of Anesthesia Complications Negative for: history of anesthetic complications  Airway Mallampati: II  TM Distance: >3 FB Neck ROM: Full    Dental no notable dental hx. (+) Dental Advisory Given   Pulmonary neg pulmonary ROS,    Pulmonary exam normal        Cardiovascular hypertension, Pt. on home beta blockers + CAD, + Past MI and + Cardiac Stents  Normal cardiovascular exam     Neuro/Psych  Headaches, PSYCHIATRIC DISORDERS Anxiety Depression    GI/Hepatic Neg liver ROS, GERD  ,  Endo/Other  Hypothyroidism Morbid obesity  Renal/GU negative Renal ROS     Musculoskeletal   Abdominal   Peds  Hematology   Anesthesia Other Findings   Reproductive/Obstetrics                            Anesthesia Physical Anesthesia Plan  ASA: III  Anesthesia Plan: MAC and Spinal   Post-op Pain Management:    Induction:   Airway Management Planned: Natural Airway and Simple Face Mask  Additional Equipment:   Intra-op Plan:   Post-operative Plan:   Informed Consent:   Plan Discussed with:   Anesthesia Plan Comments:         Anesthesia Quick Evaluation

## 2016-11-02 LAB — CBC
HCT: 34.5 % — ABNORMAL LOW (ref 36.0–46.0)
Hemoglobin: 11.1 g/dL — ABNORMAL LOW (ref 12.0–15.0)
MCH: 26.8 pg (ref 26.0–34.0)
MCHC: 32.2 g/dL (ref 30.0–36.0)
MCV: 83.3 fL (ref 78.0–100.0)
Platelets: 137 10*3/uL — ABNORMAL LOW (ref 150–400)
RBC: 4.14 MIL/uL (ref 3.87–5.11)
RDW: 14 % (ref 11.5–15.5)
WBC: 10.5 10*3/uL (ref 4.0–10.5)

## 2016-11-02 LAB — BASIC METABOLIC PANEL
Anion gap: 8 (ref 5–15)
BUN: 18 mg/dL (ref 6–20)
CO2: 23 mmol/L (ref 22–32)
Calcium: 8.3 mg/dL — ABNORMAL LOW (ref 8.9–10.3)
Chloride: 102 mmol/L (ref 101–111)
Creatinine, Ser: 1.15 mg/dL — ABNORMAL HIGH (ref 0.44–1.00)
GFR calc Af Amer: 56 mL/min — ABNORMAL LOW (ref >60)
GFR calc non Af Amer: 48 mL/min — ABNORMAL LOW (ref >60)
Glucose, Bld: 143 mg/dL — ABNORMAL HIGH (ref 65–99)
Potassium: 4.5 mmol/L (ref 3.5–5.1)
Sodium: 133 mmol/L — ABNORMAL LOW (ref 135–145)

## 2016-11-02 MED ORDER — ASPIRIN 81 MG PO CHEW: 81.0000 mg | CHEWABLE_TABLET | Freq: Two times a day (BID) | ORAL | 0 refills | Status: DC

## 2016-11-02 MED ORDER — METHOCARBAMOL 500 MG PO TABS: 500.0000 mg | ORAL_TABLET | Freq: Four times a day (QID) | ORAL | 0 refills | Status: DC | PRN

## 2016-11-02 MED ORDER — OXYCODONE-ACETAMINOPHEN 5-325 MG PO TABS: 1.0000 | ORAL_TABLET | ORAL | 0 refills | Status: DC | PRN

## 2016-11-02 NOTE — Progress Notes (Signed)
   Subjective: 1 Day Post-Op Procedure(s) (LRB): RIGHT TOTAL HIP ARTHROPLASTY ANTERIOR APPROACH (Right) Patient reports pain as mild.    Objective: Vital signs in last 24 hours: Temp:  [97.4 F (36.3 C)-98.9 F (37.2 C)] 98.9 F (37.2 C) (03/03 0555) Pulse Rate:  [75-100] 96 (03/03 0555) Resp:  [15-19] 18 (03/03 0555) BP: (108-149)/(45-90) 114/86 (03/03 0555) SpO2:  [91 %-100 %] 98 % (03/03 0555)  Intake/Output from previous day: 03/02 0701 - 03/03 0700 In: 2540 [P.O.:600; I.V.:1630; IV Piggyback:310] Out: 1500 [Urine:1350; Blood:150] Intake/Output this shift: Total I/O In: 360 [P.O.:360] Out: -    Recent Labs  11/02/16 0437  HGB 11.1*    Recent Labs  11/02/16 0437  WBC 10.5  RBC 4.14  HCT 34.5*  PLT 137*    Recent Labs  11/02/16 0437  NA 133*  K 4.5  CL 102  CO2 23  BUN 18  CREATININE 1.15*  GLUCOSE 143*  CALCIUM 8.3*   No results for input(s): LABPT, INR in the last 72 hours.  Neurologically intact Dg Pelvis Portable  Result Date: 11/01/2016 CLINICAL DATA:  66 year old female status post right total hip replacement. EXAM: PORTABLE PELVIS 1-2 VIEWS COMPARISON:  11/01/2016. FINDINGS: Single AP view of the lower pelvis and upper femurs demonstrates postoperative changes of recent right total hip arthroplasty. The acetabular and femoral components of the prosthesis appear well seated, without definite periprosthetic fracture or other acute abnormality. Small amount of gas in the joint space and overlying soft tissues. IMPRESSION: 1. Expected postoperative findings of recent right-sided total hip arthroplasty, as above. Electronically Signed   By: Vinnie Langton M.D.   On: 11/01/2016 11:56   Dg C-arm 1-60 Min-no Report  Result Date: 11/01/2016 Fluoroscopy was utilized by the requesting physician.  No radiographic interpretation.   Dg Hip Operative Unilat With Pelvis Right  Result Date: 11/01/2016 CLINICAL DATA:  Right hip replacement EXAM: OPERATIVE  right HIP (WITH PELVIS IF PERFORMED) 2 VIEWS TECHNIQUE: Fluoroscopic spot image(s) were submitted for interpretation post-operatively. COMPARISON:  None. FINDINGS: Right hip replacement in satisfactory position alignment. No fracture or immediate complication. IMPRESSION: Satisfactory right hip replacement. Electronically Signed   By: Franchot Gallo M.D.   On: 11/01/2016 11:09    Assessment/Plan: 1 Day Post-Op Procedure(s) (LRB): RIGHT TOTAL HIP ARTHROPLASTY ANTERIOR APPROACH (Right) Up with therapy saline lock IV  Dawn Davidson 11/02/2016, 9:29 AM

## 2016-11-02 NOTE — Progress Notes (Signed)
Physical Therapy Treatment Patient Details Name: Dawn Davidson MRN: [id number] DOB: March 01, 1951 Today's Date: 11/02/2016    History of Present Illness Pt s/p R THR and with hx of R TKR (07), CAD and MI    PT Comments    Pt very motivated but ltd this pm by increased fatigue.   Follow Up Recommendations  Home health PT     Equipment Recommendations  None recommended by PT    Recommendations for Other Services OT consult     Precautions / Restrictions Precautions Precautions: Fall Restrictions Weight Bearing Restrictions: No Other Position/Activity Restrictions: WBAT    Mobility  Bed Mobility Overal bed mobility: Needs Assistance Bed Mobility: Sit to Supine     Supine to sit: Supervision Sit to supine: Min assist   General bed mobility comments: Pt OOB in chair and requests return to same  Transfers Overall transfer level: Needs assistance Equipment used: Rolling walker (2 wheeled) Transfers: Sit to/from Stand Sit to Stand: Min guard Stand pivot transfers: Supervision       General transfer comment: cues for LE management and use of UEs to self assist  Ambulation/Gait Ambulation/Gait assistance: Min assist Ambulation Distance (Feet): 84 Feet Assistive device: Rolling walker (2 wheeled) Gait Pattern/deviations: Step-to pattern;Decreased step length - right;Decreased step length - left;Shuffle;Trunk flexed Gait velocity: decr Gait velocity interpretation: Below normal speed for age/gender General Gait Details: cues for sequence, posture and position from RW; multiple standing rest breaks to complete task   Stairs            Wheelchair Mobility    Modified Rankin (Stroke Patients Only)       Balance Overall balance assessment: No apparent balance deficits (not formally assessed)                                  Cognition Arousal/Alertness: Awake/alert Behavior During Therapy: WFL for tasks assessed/performed Overall  Cognitive Status: Within Functional Limits for tasks assessed                      Exercises Total Joint Exercises Ankle Circles/Pumps: AROM;Both;15 reps;Supine Quad Sets: AROM;Both;10 reps;Supine Heel Slides: AAROM;Right;15 reps;Supine Hip ABduction/ADduction: AAROM;Right;15 reps;Supine    General Comments        Pertinent Vitals/Pain Pain Assessment: 0-10 Pain Score: 4  Pain Location: R hip Pain Descriptors / Indicators: Aching;Sore Pain Intervention(s): Limited activity within patient's tolerance;Monitored during session;Premedicated before session;Ice applied    Home Living Family/patient expects to be discharged to:: Private residence Living Arrangements: Other (Comment);Non-relatives/Friends (66 yr old grandson lives with pt) Available Help at Discharge: Family;Friend(s) Type of Home: House Home Access: Stairs to enter   Home Layout: Two level Home Equipment: Environmental consultant - 2 wheels;Walker - 4 wheels;Cane - quad;Shower seat;Grab bars - tub/shower;Adaptive equipment Additional Comments: lives with 66 y.o. grandson     Prior Function Level of Independence: Independent with assistive device(s)      Comments: using RW prior to surgery   PT Goals (current goals can now be found in the care plan section) Acute Rehab PT Goals Patient Stated Goal: regain independence  PT Goal Formulation: With patient Time For Goal Achievement: 11/03/16 Potential to Achieve Goals: Good Progress towards PT goals: Progressing toward goals    Frequency    7X/week      PT Plan Current plan remains appropriate    Co-evaluation  End of Session Equipment Utilized During Treatment: Gait belt Activity Tolerance: Patient tolerated treatment well;Patient limited by fatigue Patient left: in chair;with call bell/phone within reach;with family/visitor present Nurse Communication: Mobility status PT Visit Diagnosis: Difficulty in walking, not elsewhere classified  (R26.2)     Time: [id number] PT Time Calculation (min) (ACUTE ONLY): 18 min  Charges:  $Gait Training: 8-22 mins $Therapeutic Exercise: 8-22 mins                    G Codes:       Dawn Davidson November 16, 2016, 3:53 PM

## 2016-11-02 NOTE — Discharge Instructions (Signed)

## 2016-11-02 NOTE — Evaluation (Signed)
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 All education has been completed and the patient has no further questions. Pt demonstrates good safety awareness.  She is able to perform ADLs at supervision level.  See below for any follow-up Occupational Therapy or equipment needs. OT is signing off. Thank you for this referral.        Follow Up Recommendations  No OT follow up;Supervision - Intermittent    Equipment Recommendations  None recommended by OT    Recommendations for Other Services       Precautions / Restrictions Precautions Precautions: Fall Restrictions Other Position/Activity Restrictions: WBAT      Mobility Bed Mobility Overal bed mobility: Needs Assistance Bed Mobility: Supine to Sit;Sit to Supine     Supine to sit: Supervision Sit to supine: Supervision   General bed mobility comments: requires increased time   Transfers Overall transfer level: Needs assistance Equipment used: Rolling walker (2 wheeled) Transfers: Sit to/from [facility] Sit to Stand: Supervision Stand pivot transfers: Supervision       General transfer comment: Pt demonstrates good safety awareness     Balance Overall balance assessment: No apparent balance deficits (not formally assessed)                                          ADL Overall ADL's : Needs assistance/impaired Eating/Feeding: Independent   Grooming: Wash/dry hands;Brushing hair;Wash/dry face;Supervision/safety;Standing   Upper Body Bathing: Set up;Sitting   Lower Body Bathing: Supervison/ safety;Sit to/from stand   Upper Body Dressing : Set up;Sitting   Lower Body Dressing: Supervision/safety;With  adaptive equipment;Sit to/from stand Lower Body Dressing Details (indicate cue type and reason): has AE  Toilet Transfer: Supervision/safety;Ambulation;Comfort height toilet;BSC;Grab bars;RW   Toileting- Clothing Manipulation and Hygiene: Supervision/safety;Sit to/from Nurse, children's Details (indicate cue type and reason): reviewed safe technique and to not attempt until she is able to Fully weight bear on Rt LE  Functional mobility during ADLs: Supervision/safety;Rolling walker       Vision         Perception     Praxis      Pertinent Vitals/Pain Pain Assessment: 0-10 Pain Score: 2  Pain Location: R hip Pain Descriptors / Indicators: Aching;Sore Pain Intervention(s): Limited activity within patient's tolerance     Hand Dominance Right   Extremity/Trunk Assessment Upper Extremity Assessment Upper Extremity Assessment: Overall WFL for tasks assessed   Lower Extremity Assessment Lower Extremity Assessment: Defer to PT evaluation       Communication Communication Communication: No difficulties   Cognition Arousal/Alertness: Awake/alert Behavior During Therapy: WFL for tasks assessed/performed Overall Cognitive Status: Within Functional Limits for tasks assessed                     General Comments       Exercises       Shoulder Instructions      Home Living Family/patient expects to be discharged to:: Private residence Living Arrangements: Other (Comment);Non-relatives/Friends (37 yr old grandson lives with pt) Available Help at Discharge: Family;Friend(s) Type of Home: House Home Access: Stairs to  enter Entrance Stairs-Number of Steps: 1   Home Layout: Two level Alternate Level Stairs-Number of Steps: 14 Alternate Level Stairs-Rails: Right Bathroom Shower/Tub: Tub/shower unit;Curtain Shower/tub characteristics: Architectural technologist: Handicapped height Bathroom Accessibility: Yes How Accessible: Accessible via walker Home  Equipment: [place] - 2 wheels;Walker - 4 wheels;Cane - quad;Shower seat;Grab bars - tub/shower;Adaptive equipment Adaptive Equipment: Reacher;Sock aid Additional Comments: lives with 42 y.o. grandson       Prior Functioning/Environment Level of Independence: Independent with assistive device(s)        Comments: using RW prior to surgery        OT Problem List: Decreased strength;Decreased knowledge of use of DME or AE;Obesity;Pain      OT Treatment/Interventions:      OT Goals(Current goals can be found in the care plan section) Acute Rehab OT Goals Patient Stated Goal: regain independence  OT Goal Formulation: All assessment and education complete, DC therapy  OT Frequency:     Barriers to D/C:            Co-evaluation              End of Session Equipment Utilized During Treatment: Gait belt;Rolling walker Nurse Communication: Mobility status  Activity Tolerance: Patient tolerated treatment well Patient left: in bed;with call bell/phone within reach  OT Visit Diagnosis: Pain Pain - Right/Left: Right Pain - part of body: Hip                ADL either performed or assessed with clinical judgement  Time: 1023-1055 OT Time Calculation (min): 32 min Charges:  OT General Charges $OT Visit: 1 Procedure OT Evaluation $OT Eval Low Complexity: 1 Procedure OT Treatments $Self Care/Home Management : 8-22 mins G-Codes:     [facility], OTR/L [id number]   Lucille Passy M 11/02/2016, 12:25 PM

## 2016-11-02 NOTE — Progress Notes (Signed)
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  Follow Up Recommendations  Home health PT     Equipment Recommendations  None recommended by PT    Recommendations for Other Services OT consult     Precautions / Restrictions Precautions Precautions: Fall Restrictions Weight Bearing Restrictions: No Other Position/Activity Restrictions: WBAT    Mobility  Bed Mobility Overal bed mobility: Needs Assistance Bed Mobility: Sit to Supine     Supine to sit: Supervision Sit to supine: Min assist   General bed mobility comments: increased time with assist to manage R LE  Transfers Overall transfer level: Needs assistance Equipment used: Rolling walker (2 wheeled) Transfers: Sit to/from Stand Sit to Stand: Min guard Stand pivot transfers: Supervision       General transfer comment: cues for LE management and use of UEs to self assist  Ambulation/Gait Ambulation/Gait assistance: Min assist Ambulation Distance (Feet): 135 Feet Assistive device: Rolling walker (2 wheeled) Gait Pattern/deviations: Step-to pattern;Decreased step length - right;Decreased step length - left;Shuffle;Trunk flexed Gait velocity: decr Gait velocity interpretation: Below normal speed for age/gender General Gait Details: cues for sequence, posture and position from RW; multiple standing rest breaks to complete task   Stairs            Wheelchair Mobility    Modified Rankin (Stroke Patients Only)       Balance Overall balance assessment: No apparent balance deficits (not formally assessed)                                  Cognition Arousal/Alertness: Awake/alert Behavior During Therapy: WFL for tasks  assessed/performed Overall Cognitive Status: Within Functional Limits for tasks assessed                      Exercises Total Joint Exercises Ankle Circles/Pumps: AROM;Both;15 reps;Supine Quad Sets: AROM;Both;10 reps;Supine Heel Slides: AAROM;Right;15 reps;Supine Hip ABduction/ADduction: AAROM;Right;15 reps;Supine    General Comments        Pertinent Vitals/Pain Pain Assessment: 0-10 Pain Score: 4  Pain Location: R hip Pain Descriptors / Indicators: Aching;Sore Pain Intervention(s): Limited activity within patient's tolerance;Monitored during session;Premedicated before session;Ice applied    Home Living Family/patient expects to be discharged to:: Private residence Living Arrangements: Other (Comment);Non-relatives/Friends (48 yr old grandson lives with pt) Available Help at Discharge: Family;Friend(s) Type of Home: House Home Access: Stairs to enter   Home Layout: Two level Home Equipment: Environmental consultant - 2 wheels;Walker - 4 wheels;Cane - quad;Shower seat;Grab bars - tub/shower;Adaptive equipment Additional Comments: lives with 30 y.o. grandson     Prior Function Level of Independence: Independent with assistive device(s)      Comments: using RW prior to surgery   PT Goals (current goals can now be found in the care plan section) Acute Rehab PT Goals Patient Stated Goal: regain independence  PT Goal Formulation: With patient Time For Goal Achievement: 11/03/16 Potential to Achieve Goals: Good Progress towards PT goals: Progressing toward goals    Frequency    7X/week      PT Plan Current plan remains appropriate    Co-evaluation  End of Session Equipment Utilized During Treatment: Gait belt Activity Tolerance: Patient tolerated treatment well;Patient limited by fatigue Patient left: in bed;with call bell/phone within reach Nurse Communication: Mobility status PT Visit Diagnosis: Difficulty in walking, not elsewhere classified (R26.2)      Time: [id number] PT Time Calculation (min) (ACUTE ONLY): 28 min  Charges:  $Gait Training: 8-22 mins $Therapeutic Exercise: 8-22 mins                    G Codes:       Dawn Davidson 11/07/16, 12:47 PM

## 2016-11-02 NOTE — Care Management Note (Signed)
Case Management Note  Patient Details  Name: Dawn Davidson MRN: [id number] Date of Birth: 08-18-1951  Subjective/Objective:          Right THA          Action/Plan: Discharge Planning: NCM spoke to pt and offered choice for [facility]. Pt was preoperatively arranged with [facility]. Pt agreeable to [facility]. Pt requesting RW and 3n1 for home. Contacted [facility] DME rep for equipment. Pt states her grandson will be at home to assist with care.   PCP Jonathon Jordan  Expected Discharge Date:             Expected Discharge Plan:  [place]  In-House Referral:  NA  Discharge planning Services  CM Consult  Post Acute Care Choice:  Home Health Choice offered to:  Patient  DME Arranged:  3-N-1, Walker rolling DME Agency:  [place]:  PT [place]:  [facility] ([facility]  Status of Service:  Completed, signed off  If discussed at [employer] of Stay Meetings, dates discussed:    Additional Comments:  Erenest Rasher, RN 11/02/2016, 11:56 AM

## 2016-11-02 NOTE — Progress Notes (Addendum)
Nurse paged on call provider at [place] to verify metoprolol tartrate 25mg  due. Patients BP currently 137/47, P 100. Nurse is waiting on return call.   Dr. Lorin Mercy returned call to nurse. Dr. Lorin Mercy aware of BP 137/47, P 100. Per Dr. Lorin Mercy give metoprolol 25mg  due to history of MI/Heart attack. Nurse will give medication.

## 2016-11-03 NOTE — Progress Notes (Signed)
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    Follow Up Recommendations  Home health PT     Equipment Recommendations  None recommended by PT    Recommendations for Other Services OT consult     Precautions / Restrictions Precautions Precautions: Fall Restrictions Weight Bearing Restrictions: No Other Position/Activity Restrictions: WBAT    Mobility  Bed Mobility               General bed mobility comments: Pt OOB in chair and requests return to same  Transfers Overall transfer level: Needs assistance Equipment used: Rolling walker (2 wheeled) Transfers: Sit to/from Stand Sit to Stand: Min guard         General transfer comment: cues for use of UEs to self assist  Ambulation/Gait Ambulation/Gait assistance: Min guard Ambulation Distance (Feet): 155 Feet Assistive device: Rolling walker (2 wheeled) Gait Pattern/deviations: Step-to pattern;Decreased step length - right;Decreased step length - left;Shuffle;Trunk flexed Gait velocity: decr Gait velocity interpretation: Below normal speed for age/gender General Gait Details: cues for sequence, posture and position from RW; multiple standing rest breaks to complete task   Stairs Stairs: Yes   Stair Management: No rails;Step to pattern;Forwards;With walker Number of Stairs: 2 General stair comments: single step twice fwd with RW and cues for sequence and foot/RW placement  Wheelchair Mobility    Modified Rankin (Stroke Patients Only)       Balance Overall balance assessment: No apparent balance deficits (not formally assessed)                                  Cognition Arousal/Alertness: Awake/alert Behavior  During Therapy: WFL for tasks assessed/performed Overall Cognitive Status: Within Functional Limits for tasks assessed                      Exercises      General Comments        Pertinent Vitals/Pain Pain Assessment: 0-10 Pain Score: 4  Pain Location: R hip Pain Descriptors / Indicators: Aching;Sore Pain Intervention(s): Limited activity within patient's tolerance;Monitored during session;Ice applied    Home Living                      Prior Function            PT Goals (current goals can now be found in the care plan section) Acute Rehab PT Goals Patient Stated Goal: regain independence  PT Goal Formulation: With patient Time For Goal Achievement: 11/03/16 Potential to Achieve Goals: Good Progress towards PT goals: Progressing toward goals    Frequency    7X/week      PT Plan Current plan remains appropriate    Co-evaluation             End of Session Equipment Utilized During Treatment: Gait belt Activity Tolerance: Patient tolerated treatment well;Patient limited by fatigue Patient left: in chair;with call bell/phone within reach;with family/visitor present Nurse Communication: Mobility status PT Visit Diagnosis: Difficulty in walking, not elsewhere classified (R26.2)     Time: 1047-1110 PT Time Calculation (min) (ACUTE ONLY): 23 min  Charges:  $Gait  Training: 23-37 mins                    G Codes:       Sylus Stgermain 11-05-2016, 1:00 PM

## 2016-11-03 NOTE — Progress Notes (Signed)
Discharged from floor via w/c for transport home by car. Belongings & friend with pt. No changes in assessment. Jaimy Kliethermes  

## 2016-11-03 NOTE — Progress Notes (Signed)
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  Pt friend present and reviewed stairs and car transfers.   Follow Up Recommendations  Home health PT     Equipment Recommendations  None recommended by PT    Recommendations for Other Services OT consult     Precautions / Restrictions Precautions Precautions: Fall Restrictions Weight Bearing Restrictions: No Other Position/Activity Restrictions: WBAT    Mobility  Bed Mobility               General bed mobility comments: Pt OOB in chair and requests return to same  Transfers Overall transfer level: Needs assistance Equipment used: Rolling walker (2 wheeled) Transfers: Sit to/from Stand Sit to Stand: Supervision         General transfer comment: cues for use of UEs to self assist  Ambulation/Gait Ambulation/Gait assistance: Min guard;Supervision Ambulation Distance (Feet): 50 Feet Assistive device: Rolling walker (2 wheeled) Gait Pattern/deviations: Step-to pattern;Decreased step length - right;Decreased step length - left;Shuffle;Trunk flexed Gait velocity: decr Gait velocity interpretation: Below normal speed for age/gender General Gait Details: cues for sequence, posture and position from RW; multiple standing rest breaks to complete task   Stairs Stairs: Yes   Stair Management: One rail Left;Step to pattern;Forwards;With cane Number of Stairs: 4 General stair comments: cues for sequence and foot/QC placement.  Pt friend present and observing with written instructions provided  Wheelchair Mobility    Modified Rankin (Stroke Patients Only)       Balance Overall balance assessment: No apparent balance deficits (not formally assessed)                                  Cognition  Arousal/Alertness: Awake/alert Behavior During Therapy: WFL for tasks assessed/performed Overall Cognitive Status: Within Functional Limits for tasks assessed                      Exercises      General Comments        Pertinent Vitals/Pain Pain Assessment: 0-10 Pain Score: 5  Pain Location: R hip Pain Descriptors / Indicators: Aching;Sore Pain Intervention(s): Limited activity within patient's tolerance;Monitored during session;Patient requesting pain meds-RN notified;Ice applied    Home Living                      Prior Function            PT Goals (current goals can now be found in the care plan section) Acute Rehab PT Goals Patient Stated Goal: regain independence  PT Goal Formulation: With patient Time For Goal Achievement: 11/03/16 Potential to Achieve Goals: Good Progress towards PT goals: Progressing toward goals    Frequency    7X/week      PT Plan Current plan remains appropriate    Co-evaluation             End of Session Equipment Utilized During Treatment: Gait belt Activity Tolerance: Patient tolerated treatment well;Patient limited by fatigue Patient left: in chair;with call bell/phone within reach;with family/visitor present Nurse Communication: Mobility status PT Visit Diagnosis: Difficulty in walking, not elsewhere classified (R26.2)     Time: 1202-1222 PT Time Calculation (min) (ACUTE  ONLY): 20 min  Charges:  $Gait Training: 23-37 mins $Therapeutic Activity: 8-22 mins                    G Codes:       Haylyn Halberg 2016-12-01, 1:06 PM

## 2016-11-03 NOTE — Progress Notes (Signed)
   Subjective: 2 Days Post-Op Procedure(s) (LRB): RIGHT TOTAL HIP ARTHROPLASTY ANTERIOR APPROACH (Right) Patient reports pain as mild.    Objective: Vital signs in last 24 hours: Temp:  [98.8 F (37.1 C)-99.1 F (37.3 C)] 98.8 F (37.1 C) (03/04 0505) Pulse Rate:  [90-100] 99 (03/04 0505) Resp:  [17-20] 20 (03/04 0505) BP: (125-137)/(45-53) 128/53 (03/04 0505) SpO2:  [92 %-97 %] 97 % (03/04 0505)  Intake/Output from previous day: 03/03 0701 - 03/04 0700 In: 1205 [P.O.:1080; I.V.:125] Out: 600 [Urine:600] Intake/Output this shift: Total I/O In: 240 [P.O.:240] Out: -    Recent Labs  11/02/16 0437  HGB 11.1*    Recent Labs  11/02/16 0437  WBC 10.5  RBC 4.14  HCT 34.5*  PLT 137*    Recent Labs  11/02/16 0437  NA 133*  K 4.5  CL 102  CO2 23  BUN 18  CREATININE 1.15*  GLUCOSE 143*  CALCIUM 8.3*   No results for input(s): LABPT, INR in the last 72 hours.  Neurologically intact No results found.  Assessment/Plan: 2 Days Post-Op Procedure(s) (LRB): RIGHT TOTAL HIP ARTHROPLASTY ANTERIOR APPROACH (Right) Plan:  Discharge home. rx on chart  Dawn Davidson 11/03/2016, 10:27 AM

## 2016-11-04 DIAGNOSIS — M48062 Spinal stenosis, lumbar region with neurogenic claudication: Secondary | ICD-10-CM | POA: Diagnosis not present

## 2016-11-04 DIAGNOSIS — R269 Unspecified abnormalities of gait and mobility: Secondary | ICD-10-CM | POA: Diagnosis not present

## 2016-11-04 DIAGNOSIS — M5416 Radiculopathy, lumbar region: Secondary | ICD-10-CM | POA: Diagnosis not present

## 2016-11-04 NOTE — Op Note (Signed)
NAMEBERDA, CORONEL              ACCOUNT NO.:  [id number]  MEDICAL RECORD NO.:  [id number]  LOCATION:                                FACILITY:  [facility]  PHYSICIAN:  Lind Guest. Ninfa Linden, M.D.DATE OF BIRTH:  03/27/1951  DATE OF PROCEDURE:  11/01/2016 DATE OF DISCHARGE:  11/03/2016                              OPERATIVE REPORT   PREOPERATIVE DIAGNOSIS:  Primary osteoarthritis and degenerative joint disease, right hip.  POSTOPERATIVE DIAGNOSIS:  Primary osteoarthritis and degenerative joint disease, right hip.  PROCEDURE:  Right total hip arthroplasty through direct anterior approach.  IMPLANTS:  DePuy Sector Gription acetabular component size 52, size 36+ 0 polyethylene liner, size 12 Corail femoral component with varus offset, size 36+ 8.5 ceramic hip ball.  SURGEON:  Lind Guest. Ninfa Linden, M.D.  ASSISTANT:  Erskine Emery, PA-C.  ANESTHESIA:  Spinal.  ANTIBIOTICS:  3 g of IV Ancef.  BLOOD LOSS:  Less than 200 mL.  COMPLICATIONS:  None.  INDICATIONS:  Ms. Valvo is a 66 year old morbidly obese female with debilitating arthritis involving her right hip.  It is bone-on-bone wear at this point.  There is collapse of the femoral head.  Her pain is daily and it has detrimentally affected her activities of daily living, her quality of life, her mobility.  It is 10/10 pain.  She has tried and failed all forms of conservative treatment.  I was able to examine her and see that in spite of her morbid obesity we are able to mobilize her soft tissue and have a clearer path to her hip.  We talked about the risk of acute blood loss anemia, nerve and vessel injury, fracture, infection, dislocation, DVT.  She understands all these risks are high given her obesity.  She understands our goals are decreased pain, improved mobility, and overall improved quality of life.  PROCEDURE DESCRIPTION:  After informed consent was obtained, appropriate right hip was marked.  She was brought to  the operating room, and spinal anesthesia was obtained while she was on her stretcher.  A Foley catheter was placed and both feet had traction boots applied to them. Next, she was placed supine on the Hana fracture table, the perineal post in place, and both legs in inline skeletal traction devices, but no traction applied.  Her right operative hip was prepped and draped with DuraPrep and sterile drapes.  Time-out was called.  She was identified as correct patient and correct right hip.  I then made incision just inferior and posterior to the anterior superior iliac spine and carried this obliquely down the leg.  I dissected down the tensor fascia lata muscle.  The tensor fascia was then divided longitudinally to proceed with a direct anterior approach to the hip.  I identified and cauterized the circumflex vessels and then identified the hip capsule.  I opened up the hip capsule finding a moderate joint effusion and significant periarticular osteophytes.  I placed Cobra retractors around the medial and lateral femoral neck and then made our femoral neck cut with an oscillating saw proximal to the lesser trochanter and completed this with an osteotome.  I placed a corkscrew guide in the femoral head, removed the femoral head in  its entirety, found to be completely devoid of cartilage.  We then cleaned the acetabular remnants of the acetabular labrum and other debris.  I was able to place a bent retractor over the medial acetabular rim and then removed remnants of the acetabular labrum and other debris.  We then began reaming under direct visualization from a size 42 reamer in stepwise increments up to a size 52 with all reamers under direct visualization and the last 2 reamers under direct fluoroscopy, so we could obtain our depth of reaming, my inclination and anteversion.  Once we were pleased with this, we placed the real DePuy Sector Gription acetabular component size 52 and a single  screw.  We placed a 36+ 0 neutral polyethylene liner for that size acetabular component.  Attention was then turned to the femur.  With the leg externally rotated to 120 degrees, extended and adducted, we were able to place a Mueller retractor medially and a Hohmann retractor behind the greater trochanter.  I released lateral joint capsule and used a box cutting osteotome in the inner femoral canal and a rongeur to lateralize.  I then began broaching using the Corail broaching system from a size 8 broach up to a size 12.  With a size 12 in place, we trialed a standard offset femoral neck and a 36+ 1.5 hip ball reduced this in the acetabulum and I was not pleased with offset and stability. Once past 90 degrees, I got a pull on her and dislocated that helped me understand what I really needed to do put in her in terms of increasing her offset but also her leg length.  We dislocated the hip and removed the trial components.  I was able to place the real Corail femoral component with varus offset size 12 and then placed the real 36+ 8.5 ceramic hip ball, rolled leg back over and up with traction reduced in the pelvis and we were pleased with our offset, range of motion, and stability.  Of note, leg length discrepancy is going to be there due to her significant flexion contracture of her left knee and she understands that.  We were again pleased with stability.  We then irrigated the soft tissue with normal saline solution using pulsatile lavage.  We were able to close the joint capsule with interrupted #1 Ethibond suture followed by running #1 Vicryl to the tensor fascia, 0 Vicryl in the deep tissue, 2-0 Vicryl in subcutaneous tissue, interrupted staples on the skin. Xeroform and Aquacel dressings were applied.  She was taken off the Hana table and taken to the recovery room in stable condition.  All final counts were correct.  There were no complications noted.  Of note, [place], [facility],  assisted in the entire case.  His assistance was crucial for facilitating all aspects of this case.     Lind Guest. Ninfa Linden, M.D.     CYB/MEDQ  D:  11/01/2016  T:  11/02/2016  Job:  [id number]

## 2016-11-05 ENCOUNTER — Telehealth (INDEPENDENT_AMBULATORY_CARE_PROVIDER_SITE_OTHER): Payer: Self-pay | Admitting: Radiology

## 2016-11-05 ENCOUNTER — Telehealth (INDEPENDENT_AMBULATORY_CARE_PROVIDER_SITE_OTHER): Payer: Self-pay | Admitting: Orthopaedic Surgery

## 2016-11-05 DIAGNOSIS — I2510 Atherosclerotic heart disease of native coronary artery without angina pectoris: Secondary | ICD-10-CM | POA: Diagnosis not present

## 2016-11-05 DIAGNOSIS — I10 Essential (primary) hypertension: Secondary | ICD-10-CM | POA: Diagnosis not present

## 2016-11-05 DIAGNOSIS — M1990 Unspecified osteoarthritis, unspecified site: Secondary | ICD-10-CM | POA: Diagnosis not present

## 2016-11-05 DIAGNOSIS — M48062 Spinal stenosis, lumbar region with neurogenic claudication: Secondary | ICD-10-CM | POA: Diagnosis not present

## 2016-11-05 DIAGNOSIS — G8929 Other chronic pain: Secondary | ICD-10-CM | POA: Diagnosis not present

## 2016-11-05 DIAGNOSIS — Z471 Aftercare following joint replacement surgery: Secondary | ICD-10-CM | POA: Diagnosis not present

## 2016-11-05 NOTE — Telephone Encounter (Signed)
Dawn Davidson (PT) with [facility] called needing verbal orders for (PT) 3 wk 2. The number to contact him is [phone]

## 2016-11-05 NOTE — Telephone Encounter (Signed)
ERROR

## 2016-11-05 NOTE — Telephone Encounter (Signed)
Verbal order left on VM  

## 2016-11-05 NOTE — Telephone Encounter (Signed)
Patient called stating that her skin is red and itching from the dressing over her incision.  Wanted to know what she could do about this. I ask Dr. Ninfa Linden and he advised that she could remove the dressing and replace it with a dry dressing and tape.  Also he advised her to take Benadryl.  I advised patient of what Dr. Ninfa Linden said. And she understands

## 2016-11-07 DIAGNOSIS — G8929 Other chronic pain: Secondary | ICD-10-CM | POA: Diagnosis not present

## 2016-11-07 DIAGNOSIS — M48062 Spinal stenosis, lumbar region with neurogenic claudication: Secondary | ICD-10-CM | POA: Diagnosis not present

## 2016-11-07 DIAGNOSIS — Z471 Aftercare following joint replacement surgery: Secondary | ICD-10-CM | POA: Diagnosis not present

## 2016-11-07 DIAGNOSIS — I10 Essential (primary) hypertension: Secondary | ICD-10-CM | POA: Diagnosis not present

## 2016-11-07 DIAGNOSIS — M1990 Unspecified osteoarthritis, unspecified site: Secondary | ICD-10-CM | POA: Diagnosis not present

## 2016-11-07 DIAGNOSIS — I2510 Atherosclerotic heart disease of native coronary artery without angina pectoris: Secondary | ICD-10-CM | POA: Diagnosis not present

## 2016-11-08 DIAGNOSIS — M48062 Spinal stenosis, lumbar region with neurogenic claudication: Secondary | ICD-10-CM | POA: Diagnosis not present

## 2016-11-08 DIAGNOSIS — I10 Essential (primary) hypertension: Secondary | ICD-10-CM | POA: Diagnosis not present

## 2016-11-08 DIAGNOSIS — Z471 Aftercare following joint replacement surgery: Secondary | ICD-10-CM | POA: Diagnosis not present

## 2016-11-08 DIAGNOSIS — G8929 Other chronic pain: Secondary | ICD-10-CM | POA: Diagnosis not present

## 2016-11-08 DIAGNOSIS — I2510 Atherosclerotic heart disease of native coronary artery without angina pectoris: Secondary | ICD-10-CM | POA: Diagnosis not present

## 2016-11-08 DIAGNOSIS — M1990 Unspecified osteoarthritis, unspecified site: Secondary | ICD-10-CM | POA: Diagnosis not present

## 2016-11-11 DIAGNOSIS — Z471 Aftercare following joint replacement surgery: Secondary | ICD-10-CM | POA: Diagnosis not present

## 2016-11-11 DIAGNOSIS — M1990 Unspecified osteoarthritis, unspecified site: Secondary | ICD-10-CM | POA: Diagnosis not present

## 2016-11-11 DIAGNOSIS — M48062 Spinal stenosis, lumbar region with neurogenic claudication: Secondary | ICD-10-CM | POA: Diagnosis not present

## 2016-11-11 DIAGNOSIS — I2510 Atherosclerotic heart disease of native coronary artery without angina pectoris: Secondary | ICD-10-CM | POA: Diagnosis not present

## 2016-11-11 DIAGNOSIS — G8929 Other chronic pain: Secondary | ICD-10-CM | POA: Diagnosis not present

## 2016-11-11 DIAGNOSIS — I10 Essential (primary) hypertension: Secondary | ICD-10-CM | POA: Diagnosis not present

## 2016-11-13 NOTE — Discharge Summary (Signed)
Patient ID: Dawn Davidson MRN: [id number] DOB/AGE: 66-Apr-1952 75 y.o.  Admit date: 11/01/2016 Discharge date: 11/13/2016  Admission Diagnoses:  Principal Problem:   Unilateral primary osteoarthritis, right hip Active Problems:   Status post total replacement of right hip   Discharge Diagnoses:  Same  Past Medical History:  Diagnosis Date  . Anxiety   . Arthritis    "neck, back, hands, knees, hips" (08/18/2015)  . 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 Chronic lower back pain   . Coronary artery disease    2010 MI  . Depression   . Family history of adverse reaction to anesthesia    her mother gets sick  . GERD (gastroesophageal reflux disease)   . Hyperlipidemia   . Hypertension   . Hypothyroidism since 02/2015  . Macular degeneration, bilateral   . Migraine    "q couple weeks recently" (08/18/2015)  . Morbid obesity ([place]) 05/10/2015  . Myocardial infarction 2012  . Temporary low platelet count ([place]) 2012   checked every 6 mths...kept her in for 1 week with MI, and she's not sure exactly why    Surgeries: Procedure(s): RIGHT TOTAL HIP ARTHROPLASTY ANTERIOR APPROACH on 11/01/2016   Consultants:   Discharged Condition: Improved  Hospital Course: Dawn Davidson is an 66 y.o. female who was admitted 11/01/2016 for operative treatment ofUnilateral primary osteoarthritis, right hip. Patient has severe unremitting pain that affects sleep, daily activities, and work/hobbies. After pre-op clearance the patient was taken to the operating room on 11/01/2016 and underwent  Procedure(s): RIGHT TOTAL HIP ARTHROPLASTY ANTERIOR APPROACH.    Patient was given perioperative antibiotics:  Anti-infectives    Start     Dose/Rate Route Frequency Ordered Stop   11/01/16 1400  ceFAZolin (ANCEF) IVPB 2g/100 mL premix     2 g 200 mL/hr over 30 Minutes Intravenous Every 6 hours 11/01/16 1256 11/01/16 2027   11/01/16 0600  ceFAZolin (ANCEF) 3 g in dextrose 5 % 50 mL IVPB     3  g 130 mL/hr over 30 Minutes Intravenous On call to O.R. 10/31/16 1241 11/01/16 0950       Patient was given sequential compression devices, early ambulation, and chemoprophylaxis to prevent DVT.  Patient benefited maximally from hospital stay and there were no complications.    Recent vital signs: No data found.    Recent laboratory studies: No results for input(s): WBC, HGB, HCT, PLT, NA, K, CL, CO2, BUN, CREATININE, GLUCOSE, INR, CALCIUM in the last 72 hours.  Invalid input(s): PT, 2   Discharge Medications:   Allergies as of 11/03/2016      Reactions   Sulfa Antibiotics Shortness Of Breath      Medication List    STOP taking these medications   aspirin EC 81 MG tablet Replaced by:  aspirin 81 MG chewable tablet   traMADol 50 MG tablet Commonly known as:  ULTRAM     TAKE these medications   acetaminophen-codeine 300-30 MG tablet Commonly known as:  TYLENOL #3 Take 1-2 tablets by mouth every 8 (eight) hours as needed for moderate pain. What changed:  how much to take   aspirin 81 MG chewable tablet Chew 1 tablet (81 mg total) by mouth 2 (two) times daily. Replaces:  aspirin EC 81 MG tablet   atorvastatin 40 MG tablet Commonly known as:  LIPITOR Take 40 mg by mouth daily.   cholecalciferol 1000 units tablet Commonly known as:  VITAMIN D Take 1,000 Units by mouth daily.  DULoxetine 60 MG capsule Commonly known as:  CYMBALTA Take 60 mg by mouth 2 (two) times daily.   HYDROcodone-acetaminophen 5-325 MG tablet Commonly known as:  NORCO Take 1-2 tablets by mouth every 6 (six) hours as needed for moderate pain or severe pain.   levothyroxine 50 MCG tablet Commonly known as:  SYNTHROID, LEVOTHROID Take 50 mcg by mouth daily before breakfast.   meloxicam 15 MG tablet Commonly known as:  MOBIC Take 15 mg by mouth daily.   methocarbamol 500 MG tablet Commonly known as:  ROBAXIN Take 1 tablet (500 mg total) by mouth every 6 (six) hours as needed for muscle  spasms.   metoprolol tartrate 25 MG tablet Commonly known as:  LOPRESSOR Take 25 mg by mouth 2 (two) times daily.   nitroGLYCERIN 0.4 MG SL tablet Commonly known as:  NITROSTAT Place 0.4 mg under the tongue every 5 (five) minutes as needed for chest pain. Reported on 10/09/2015   omeprazole 20 MG capsule Commonly known as:  PRILOSEC Take 20 mg by mouth daily.   oxyCODONE-acetaminophen 5-325 MG tablet Commonly known as:  ROXICET Take 1-2 tablets by mouth every 4 (four) hours as needed.   topiramate 50 MG tablet Commonly known as:  TOPAMAX Take 50 mg by mouth at bedtime.   traZODone 100 MG tablet Commonly known as:  DESYREL Take 100 mg by mouth at bedtime.       Diagnostic Studies: Dg Pelvis Portable  Result Date: 11/01/2016 CLINICAL DATA:  66 year old female status post right total hip replacement. EXAM: PORTABLE PELVIS 1-2 VIEWS COMPARISON:  11/01/2016. FINDINGS: Single AP view of the lower pelvis and upper femurs demonstrates postoperative changes of recent right total hip arthroplasty. The acetabular and femoral components of the prosthesis appear well seated, without definite periprosthetic fracture or other acute abnormality. Small amount of gas in the joint space and overlying soft tissues. IMPRESSION: 1. Expected postoperative findings of recent right-sided total hip arthroplasty, as above. Electronically Signed   By: Vinnie Langton M.D.   On: 11/01/2016 11:56   Dg C-arm 1-60 Min-no Report  Result Date: 11/01/2016 CLINICAL DATA:  Right hip replacement EXAM: OPERATIVE right HIP (WITH PELVIS IF PERFORMED) 2 VIEWS TECHNIQUE: Fluoroscopic spot image(s) were submitted for interpretation post-operatively. COMPARISON:  None. FINDINGS: Right hip replacement in satisfactory position alignment. No fracture or immediate complication. IMPRESSION: Satisfactory right hip replacement. Electronically Signed   By: Franchot Gallo M.D.   On: 11/01/2016 11:09   Dg Hip Operative Unilat With  Pelvis Right  Result Date: 11/01/2016 CLINICAL DATA:  Right hip replacement EXAM: OPERATIVE right HIP (WITH PELVIS IF PERFORMED) 2 VIEWS TECHNIQUE: Fluoroscopic spot image(s) were submitted for interpretation post-operatively. COMPARISON:  None. FINDINGS: Right hip replacement in satisfactory position alignment. No fracture or immediate complication. IMPRESSION: Satisfactory right hip replacement. Electronically Signed   By: Franchot Gallo M.D.   On: 11/01/2016 11:09    Disposition: 01-Home or Self Care    Follow-up Information    [facility] Follow up.   Specialty:  Home Health Services Why:  Home Health Physical Therapy Contact information: [place] [address] [phone]        Mcarthur Rossetti, MD Follow up in 2 day(s).   Specialty:  Orthopedic Surgery Contact information: [place] [address] [phone]            Signed: Erskine Emery 11/13/2016, 8:21 AM

## 2016-11-14 ENCOUNTER — Encounter (INDEPENDENT_AMBULATORY_CARE_PROVIDER_SITE_OTHER): Payer: Self-pay | Admitting: Orthopaedic Surgery

## 2016-11-14 ENCOUNTER — Ambulatory Visit (INDEPENDENT_AMBULATORY_CARE_PROVIDER_SITE_OTHER): Payer: Medicare HMO | Admitting: Orthopaedic Surgery

## 2016-11-14 DIAGNOSIS — M1990 Unspecified osteoarthritis, unspecified site: Secondary | ICD-10-CM | POA: Diagnosis not present

## 2016-11-14 DIAGNOSIS — G8929 Other chronic pain: Secondary | ICD-10-CM | POA: Diagnosis not present

## 2016-11-14 DIAGNOSIS — I2510 Atherosclerotic heart disease of native coronary artery without angina pectoris: Secondary | ICD-10-CM | POA: Diagnosis not present

## 2016-11-14 DIAGNOSIS — Z96641 Presence of right artificial hip joint: Secondary | ICD-10-CM

## 2016-11-14 DIAGNOSIS — I10 Essential (primary) hypertension: Secondary | ICD-10-CM | POA: Diagnosis not present

## 2016-11-14 DIAGNOSIS — Z471 Aftercare following joint replacement surgery: Secondary | ICD-10-CM | POA: Diagnosis not present

## 2016-11-14 DIAGNOSIS — M48062 Spinal stenosis, lumbar region with neurogenic claudication: Secondary | ICD-10-CM | POA: Diagnosis not present

## 2016-11-14 NOTE — Progress Notes (Signed)
The patient is now 13 days status post a right total hip arthroplasty direct injury approach. She is doing well. She's been on baby aspirin twice a day. New performing examination her incision looks good remove staples and placed Steri-Strips. I talked her about keeping her body fold dry underneath there were the incision is. I then aspirated at least to 60 mL syringe is full of seroma fluid from her hip. She tolerated this well. She'll go back down to a baby aspirin once a day. She does continue increase her activities as she tolerates. I'll see her back in 4 weeks to see how she is doing overall. Opposite 70 she's before then she'll let us know.

## 2016-11-15 DIAGNOSIS — M48062 Spinal stenosis, lumbar region with neurogenic claudication: Secondary | ICD-10-CM | POA: Diagnosis not present

## 2016-11-15 DIAGNOSIS — G8929 Other chronic pain: Secondary | ICD-10-CM | POA: Diagnosis not present

## 2016-11-15 DIAGNOSIS — I2510 Atherosclerotic heart disease of native coronary artery without angina pectoris: Secondary | ICD-10-CM | POA: Diagnosis not present

## 2016-11-15 DIAGNOSIS — Z471 Aftercare following joint replacement surgery: Secondary | ICD-10-CM | POA: Diagnosis not present

## 2016-11-15 DIAGNOSIS — I10 Essential (primary) hypertension: Secondary | ICD-10-CM | POA: Diagnosis not present

## 2016-11-15 DIAGNOSIS — M1990 Unspecified osteoarthritis, unspecified site: Secondary | ICD-10-CM | POA: Diagnosis not present

## 2016-11-28 ENCOUNTER — Telehealth (INDEPENDENT_AMBULATORY_CARE_PROVIDER_SITE_OTHER): Payer: Self-pay | Admitting: Orthopaedic Surgery

## 2016-11-28 NOTE — Telephone Encounter (Signed)
Patient called saying that the wound from her hip surgery on is needing to be drained again. Has an appointment April 16th but was wondering if she needed to be seen sooner. CB # [phone]

## 2016-11-28 NOTE — Telephone Encounter (Signed)
That's totally up to her. But it would have to be next week ofcourse

## 2016-12-08 ENCOUNTER — Other Ambulatory Visit (INDEPENDENT_AMBULATORY_CARE_PROVIDER_SITE_OTHER): Payer: Self-pay | Admitting: Orthopaedic Surgery

## 2016-12-09 NOTE — Telephone Encounter (Signed)
Please advise 

## 2016-12-09 NOTE — Telephone Encounter (Signed)
Called into pharmacy

## 2016-12-10 ENCOUNTER — Other Ambulatory Visit (INDEPENDENT_AMBULATORY_CARE_PROVIDER_SITE_OTHER): Payer: Self-pay | Admitting: Orthopaedic Surgery

## 2016-12-10 NOTE — Telephone Encounter (Signed)
Please advise 

## 2016-12-16 ENCOUNTER — Ambulatory Visit (INDEPENDENT_AMBULATORY_CARE_PROVIDER_SITE_OTHER): Payer: Medicare HMO | Admitting: Orthopaedic Surgery

## 2016-12-16 DIAGNOSIS — Z96641 Presence of right artificial hip joint: Secondary | ICD-10-CM

## 2016-12-16 MED ORDER — METHOCARBAMOL 500 MG PO TABS: 500.0000 mg | ORAL_TABLET | Freq: Four times a day (QID) | ORAL | 0 refills | Status: DC | PRN

## 2016-12-16 MED ORDER — MELOXICAM 7.5 MG PO TABS
7.5000 mg | ORAL_TABLET | Freq: Two times a day (BID) | ORAL | 3 refills | Status: DC | PRN
Start: 2016-12-16 — End: 2017-01-28

## 2016-12-16 NOTE — Progress Notes (Signed)
The patient is now 6 weeks status post a right total hip arthroplasty. She is doing well overall. On examination of her right hip wound the incision looks good. There is some firmness but no seroma or hematoma. She still plagued by left knee pain due to severe osteophytes of that left knee.  She'll continue increase her activities and stop the cane as comfort and balance allows. I will start her on meloxicam which is negative the past for inflammation and pain. We'll also refill her Robaxin. We'll see her back in a month to see how she doing overall but no x-rays are needed.

## 2016-12-25 DIAGNOSIS — H25811 Combined forms of age-related cataract, right eye: Secondary | ICD-10-CM | POA: Diagnosis not present

## 2016-12-25 DIAGNOSIS — H2522 Age-related cataract, morgagnian type, left eye: Secondary | ICD-10-CM | POA: Diagnosis not present

## 2016-12-25 DIAGNOSIS — H2511 Age-related nuclear cataract, right eye: Secondary | ICD-10-CM | POA: Diagnosis not present

## 2017-01-15 ENCOUNTER — Ambulatory Visit (INDEPENDENT_AMBULATORY_CARE_PROVIDER_SITE_OTHER): Payer: Medicare HMO | Admitting: Orthopaedic Surgery

## 2017-01-15 ENCOUNTER — Ambulatory Visit (INDEPENDENT_AMBULATORY_CARE_PROVIDER_SITE_OTHER): Payer: Medicare HMO

## 2017-01-15 DIAGNOSIS — G8929 Other chronic pain: Secondary | ICD-10-CM | POA: Diagnosis not present

## 2017-01-15 DIAGNOSIS — M1712 Unilateral primary osteoarthritis, left knee: Secondary | ICD-10-CM

## 2017-01-15 DIAGNOSIS — M25562 Pain in left knee: Secondary | ICD-10-CM

## 2017-01-15 DIAGNOSIS — Z96641 Presence of right artificial hip joint: Secondary | ICD-10-CM

## 2017-01-15 NOTE — Progress Notes (Signed)
The patient comes for follow-up about 9-10 weeks status post a right hip replacement of the right hip is doing excellent and has no issues at all. She's been having problems with left knee pain for long period time. We've not been able inject the knee because of how bad she does with injections having had him in other areas of her body before. Her knee locks and catches and pops quite a bit. It is detrimentally affected her activities daily living, her quality of life, and her mobility. Her pain is daily. It is become severe over the last year has she's been putting more weight through it dealing with her right hip. Again now the right hip is doing well but the left knee is causing her a significant amount of pain.  On examination her right hip or right hip has fluid range of motion with no pain at all. Her leg lengths are equal. The left knee has slight varius malalignment. There is patellofemoral crepitation and global pain. Her Lockman's is negative other Murray central line tenderness is on both sides. She has global tenderness again around the whole knee.  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 There is varus malalignment. There is osteophytes in all 3 compartments as well as sclerotic changes and complete loss of medial joint space and the patellofemoral joint.  We went over knee x-rays and talked in detail about what knee replacement surgery involves. She's had now right hip replacement and she's had her right knee replaced elsewhere. She understands this fully. With thorough discussion of the risks and benefits of surgery and what her intraoperative and postoperative course would be. She wants to get this scheduled soon so we would see her back in 2 weeks postoperative for continued follow-up of the surgery. All questions were encouraged and answered.

## 2017-01-18 ENCOUNTER — Other Ambulatory Visit (INDEPENDENT_AMBULATORY_CARE_PROVIDER_SITE_OTHER): Payer: Self-pay | Admitting: Orthopaedic Surgery

## 2017-01-20 NOTE — Telephone Encounter (Signed)
Please advise 

## 2017-01-21 ENCOUNTER — Telehealth (INDEPENDENT_AMBULATORY_CARE_PROVIDER_SITE_OTHER): Payer: Self-pay | Admitting: Orthopaedic Surgery

## 2017-01-21 NOTE — Telephone Encounter (Signed)
Patient called needing Rx refilled (Robaxin) The number to contact patient is [phone]

## 2017-01-21 NOTE — Telephone Encounter (Signed)
Ok to refill Robaxin, 500 mg 1 every 6 hours as needed for spasms, #60.

## 2017-01-21 NOTE — Telephone Encounter (Signed)
Please advise 

## 2017-01-22 NOTE — Telephone Encounter (Signed)
Called into pharmacy

## 2017-01-28 ENCOUNTER — Other Ambulatory Visit (INDEPENDENT_AMBULATORY_CARE_PROVIDER_SITE_OTHER): Payer: Self-pay

## 2017-01-28 MED ORDER — MELOXICAM 15 MG PO TABS: 15.0000 mg | ORAL_TABLET | Freq: Two times a day (BID) | ORAL | 1 refills | Status: DC | PRN

## 2017-02-11 ENCOUNTER — Other Ambulatory Visit (INDEPENDENT_AMBULATORY_CARE_PROVIDER_SITE_OTHER): Payer: Self-pay | Admitting: Orthopaedic Surgery

## 2017-02-11 NOTE — Progress Notes (Signed)
Please place orders in EPIC as patient is being scheduled for a pre-op appointment! Thank you! 

## 2017-02-14 ENCOUNTER — Other Ambulatory Visit (INDEPENDENT_AMBULATORY_CARE_PROVIDER_SITE_OTHER): Payer: Self-pay | Admitting: Physician Assistant

## 2017-02-21 ENCOUNTER — Other Ambulatory Visit (HOSPITAL_COMMUNITY): Payer: Self-pay | Admitting: Emergency Medicine

## 2017-02-21 ENCOUNTER — Encounter (HOSPITAL_COMMUNITY)
Admission: RE | Admit: 2017-02-21 | Discharge: 2017-02-21 | Disposition: A | Discharge status: Home / Self Care | Payer: Medicare HMO | Source: Ambulatory Visit | Attending: Orthopaedic Surgery | Admitting: Orthopaedic Surgery

## 2017-02-21 ENCOUNTER — Encounter (HOSPITAL_COMMUNITY): Payer: Self-pay

## 2017-02-21 DIAGNOSIS — M1712 Unilateral primary osteoarthritis, left knee: Secondary | ICD-10-CM | POA: Diagnosis not present

## 2017-02-21 DIAGNOSIS — Z01812 Encounter for preprocedural laboratory examination: Secondary | ICD-10-CM | POA: Diagnosis not present

## 2017-02-21 LAB — BASIC METABOLIC PANEL
Anion gap: 8 (ref 5–15)
BUN: 14 mg/dL (ref 6–20)
CO2: 27 mmol/L (ref 22–32)
Calcium: 9.2 mg/dL (ref 8.9–10.3)
Chloride: 103 mmol/L (ref 101–111)
Creatinine, Ser: 0.88 mg/dL (ref 0.44–1.00)
GFR calc Af Amer: >60 mL/min (ref >60)
GFR calc non Af Amer: >60 mL/min (ref >60)
Glucose, Bld: 124 mg/dL — ABNORMAL HIGH (ref 65–99)
Potassium: 4.2 mmol/L (ref 3.5–5.1)
Sodium: 138 mmol/L (ref 135–145)

## 2017-02-21 LAB — CBC
HCT: 37.5 % (ref 36.0–46.0)
Hemoglobin: 11.8 g/dL — ABNORMAL LOW (ref 12.0–15.0)
MCH: 24.8 pg — ABNORMAL LOW (ref 26.0–34.0)
MCHC: 31.5 g/dL (ref 30.0–36.0)
MCV: 78.9 fL (ref 78.0–100.0)
Platelets: 203 10*3/uL (ref 150–400)
RBC: 4.75 MIL/uL (ref 3.87–5.11)
RDW: 14.3 % (ref 11.5–15.5)
WBC: 6.9 10*3/uL (ref 4.0–10.5)

## 2017-02-21 LAB — SURGICAL PCR SCREEN
MRSA, PCR: NEGATIVE
Staphylococcus aureus: NEGATIVE

## 2017-02-21 NOTE — Progress Notes (Signed)
EKG 10-25-16 epic;  Stress test 05-31-15 epic

## 2017-02-21 NOTE — Patient Instructions (Signed)
Dawn Davidson  02/21/2017   Your procedure is scheduled on: 02-28-17  Report to [facility] Main  Entrance Take [place]  elevators to 3rd floor to  [place] at [phone].   Call this number if you have problems the morning of surgery [phone]   Remember: ONLY 1 PERSON MAY GO WITH YOU TO SHORT STAY TO GET  READY MORNING OF [place].  Do not eat food or drink liquids :After Midnight.     Take these medicines the morning of surgery with A SIP OF WATER: duloxetine(cymbalta), levothyroxine, metoprolol, omeprazole(prilosec), atorvastatin(lipitor), tylenol as needed                                You may not have any metal on your body including hair pins and              piercings  Do not wear jewelry, make-up, lotions, powders or perfumes, deodorant             Do not wear nail polish.  Do not shave  48 hours prior to surgery.          Do not bring valuables to the hospital. [place].  Contacts, dentures or bridgework may not be worn into surgery.  Leave suitcase in the car. After surgery it may be brought to your room.               Please read over the following fact sheets you were given: _____________________________________________________________________             [facility] - Preparing for Surgery Before surgery, you can play an important role.  Because skin is not sterile, your skin needs to be as free of germs as possible.  You can reduce the number of germs on your skin by washing with CHG (chlorahexidine gluconate) soap before surgery.  CHG is an antiseptic cleaner which kills germs and bonds with the skin to continue killing germs even after washing. Please DO NOT use if you have an allergy to CHG or antibacterial soaps.  If your skin becomes reddened/irritated stop using the CHG and inform your nurse when you arrive at Short Stay. Do not shave (including legs and underarms) for at  least 48 hours prior to the first CHG shower.  You may shave your face/neck. Please follow these instructions carefully:  1.  Shower with CHG Soap the night before surgery and the  morning of Surgery.  2.  If you choose to wash your hair, wash your hair first as usual with your  normal  shampoo.  3.  After you shampoo, rinse your hair and body thoroughly to remove the  shampoo.                           4.  Use CHG as you would any other liquid soap.  You can apply chg directly  to the skin and wash                       Gently with a scrungie or clean washcloth.  5.  Apply the CHG Soap to your body ONLY FROM THE NECK DOWN.  Do not use on face/ open                           Wound or open sores. Avoid contact with eyes, ears mouth and genitals (private parts).                       Wash face,  Genitals (private parts) with your normal soap.             6.  Wash thoroughly, paying special attention to the area where your surgery  will be performed.  7.  Thoroughly rinse your body with warm water from the neck down.  8.  DO NOT shower/wash with your normal soap after using and rinsing off  the CHG Soap.                9.  Pat yourself dry with a clean towel.            10.  Wear clean pajamas.            11.  Place clean sheets on your bed the night of your first shower and do not  sleep with pets. Day of Surgery : Do not apply any lotions/deodorants the morning of surgery.  Please wear clean clothes to the hospital/surgery center.  FAILURE TO FOLLOW THESE INSTRUCTIONS MAY RESULT IN THE CANCELLATION OF YOUR SURGERY PATIENT SIGNATURE_________________________________  NURSE SIGNATURE__________________________________  ________________________________________________________________________   Dawn Davidson  An incentive spirometer is a tool that can help keep your lungs clear and active. This tool measures how well you are filling your lungs with each breath. Taking long deep breaths  may help reverse or decrease the chance of developing breathing (pulmonary) problems (especially infection) following:  A long period of time when you are unable to move or be active. BEFORE THE PROCEDURE   If the spirometer includes an indicator to show your best effort, your nurse or respiratory therapist will set it to a desired goal.  If possible, sit up straight or lean slightly forward. Try not to slouch.  Hold the incentive spirometer in an upright position. INSTRUCTIONS FOR USE  1. Sit on the edge of your bed if possible, or sit up as far as you can in bed or on a chair. 2. Hold the incentive spirometer in an upright position. 3. Breathe out normally. 4. Place the mouthpiece in your mouth and seal your lips tightly around it. 5. Breathe in slowly and as deeply as possible, raising the piston or the ball toward the top of the column. 6. Hold your breath for 3-5 seconds or for as long as possible. Allow the piston or ball to fall to the bottom of the column. 7. Remove the mouthpiece from your mouth and breathe out normally. 8. Rest for a few seconds and repeat Steps 1 through 7 at least 10 times every 1-2 hours when you are awake. Take your time and take a few normal breaths between deep breaths. 9. The spirometer may include an indicator to show your best effort. Use the indicator as a goal to work toward during each repetition. 10. After each set of 10 deep breaths, practice coughing to be sure your lungs are clear. If you have an incision (the cut made at the time of surgery), support your incision when coughing by placing a pillow or rolled up towels firmly against it. Once you are able to get out of  bed, walk around indoors and cough well. You may stop using the incentive spirometer when instructed by your caregiver.  RISKS AND COMPLICATIONS  Take your time so you do not get dizzy or light-headed.  If you are in pain, you may need to take or ask for pain medication before doing  incentive spirometry. It is harder to take a deep breath if you are having pain. AFTER USE  Rest and breathe slowly and easily.  It can be helpful to keep track of a log of your progress. Your caregiver can provide you with a simple table to help with this. If you are using the spirometer at home, follow these instructions: [place] IF:   You are having difficultly using the spirometer.  You have trouble using the spirometer as often as instructed.  Your pain medication is not giving enough relief while using the spirometer.  You develop fever of 100.5 F (38.1 C) or higher. SEEK IMMEDIATE MEDICAL CARE IF:   You cough up bloody sputum that had not been present before.  You develop fever of 102 F (38.9 C) or greater.  You develop worsening pain at or near the incision site. MAKE SURE YOU:   Understand these instructions.  Will watch your condition.  Will get help right away if you are not doing well or get worse. Document Released: 12/30/2006 Document Revised: 11/11/2011 Document Reviewed: 03/02/2007 [facility] Patient Information 2014 [place], [place].   ________________________________________________________________________

## 2017-02-27 MED ORDER — DEXTROSE 5 % IV SOLN
3.0000 g | INTRAVENOUS | Status: AC
  Administered 2017-02-28: 3 g via INTRAVENOUS
  Filled 2017-02-27: qty 3

## 2017-02-27 NOTE — Anesthesia Preprocedure Evaluation (Addendum)
Anesthesia Evaluation  Patient identified by MRN, date of birth, ID band Patient awake    Reviewed: Allergy & Precautions, NPO status , Patient's Chart, lab work & pertinent test results  History of Anesthesia Complications Negative for: history of anesthetic complications  Airway Mallampati: II  TM Distance: >3 FB Neck ROM: Full    Dental no notable dental hx. (+) Dental Advisory Given   Pulmonary neg pulmonary ROS,    Pulmonary exam normal        Cardiovascular hypertension, Pt. on home beta blockers + CAD, + Past MI and + Cardiac Stents  Normal cardiovascular exam     Neuro/Psych  Headaches, PSYCHIATRIC DISORDERS Anxiety Depression    GI/Hepatic Neg liver ROS, GERD  ,  Endo/Other  Hypothyroidism Morbid obesity  Renal/GU negative Renal ROS     Musculoskeletal   Abdominal   Peds  Hematology   Anesthesia Other Findings   Reproductive/Obstetrics                             Lab Results  Component Value Date   WBC 6.9 02/21/2017   HGB 11.8 (L) 02/21/2017   HCT 37.5 02/21/2017   MCV 78.9 02/21/2017   PLT 203 02/21/2017   Lab Results  Component Value Date   CREATININE 0.88 02/21/2017   BUN 14 02/21/2017   NA 138 02/21/2017   K 4.2 02/21/2017   CL 103 02/21/2017   CO2 27 02/21/2017  No results found for: INR, PROTIME   Anesthesia Physical  Anesthesia Plan  ASA: III  Anesthesia Plan: MAC and Spinal   Post-op Pain Management:  Regional for Post-op pain   Induction: Intravenous  PONV Risk Score and Plan: 3 and Ondansetron, Dexamethasone, Propofol and Midazolam  Airway Management Planned: Natural Airway and Simple Face Mask  Additional Equipment:   Intra-op Plan:   Post-operative Plan:   Informed Consent: I have reviewed the patients History and Physical, chart, labs and discussed the procedure including the risks, benefits and alternatives for the proposed anesthesia  with the patient or authorized representative who has indicated his/her understanding and acceptance.     Plan Discussed with: CRNA  Anesthesia Plan Comments:        Anesthesia Quick Evaluation

## 2017-02-28 ENCOUNTER — Ambulatory Visit (HOSPITAL_COMMUNITY): Payer: Medicare HMO | Admitting: Anesthesiology

## 2017-02-28 ENCOUNTER — Inpatient Hospital Stay (HOSPITAL_COMMUNITY)
Admission: AD | Admit: 2017-02-28 | Discharge: 2017-03-03 | DRG: 470 | Disposition: A | Discharge status: Home Health | Payer: Medicare HMO | Source: Ambulatory Visit | Attending: Orthopaedic Surgery | Admitting: Orthopaedic Surgery

## 2017-02-28 ENCOUNTER — Encounter (HOSPITAL_COMMUNITY): Payer: Self-pay | Admitting: Anesthesiology

## 2017-02-28 ENCOUNTER — Encounter (HOSPITAL_COMMUNITY)
Admission: AD | Disposition: A | Discharge status: Home Health | Payer: Self-pay | Source: Ambulatory Visit | Attending: Orthopaedic Surgery

## 2017-02-28 ENCOUNTER — Inpatient Hospital Stay (HOSPITAL_COMMUNITY): Payer: Medicare HMO

## 2017-02-28 DIAGNOSIS — Z955 Presence of coronary angioplasty implant and graft: Secondary | ICD-10-CM | POA: Diagnosis not present

## 2017-02-28 DIAGNOSIS — I10 Essential (primary) hypertension: Secondary | ICD-10-CM | POA: Diagnosis not present

## 2017-02-28 DIAGNOSIS — I2510 Atherosclerotic heart disease of native coronary artery without angina pectoris: Secondary | ICD-10-CM | POA: Diagnosis present

## 2017-02-28 DIAGNOSIS — Z96651 Presence of right artificial knee joint: Secondary | ICD-10-CM | POA: Diagnosis present

## 2017-02-28 DIAGNOSIS — D62 Acute posthemorrhagic anemia: Secondary | ICD-10-CM | POA: Diagnosis not present

## 2017-02-28 DIAGNOSIS — E039 Hypothyroidism, unspecified: Secondary | ICD-10-CM | POA: Diagnosis not present

## 2017-02-28 DIAGNOSIS — M1712 Unilateral primary osteoarthritis, left knee: Principal | ICD-10-CM | POA: Diagnosis present

## 2017-02-28 DIAGNOSIS — K219 Gastro-esophageal reflux disease without esophagitis: Secondary | ICD-10-CM | POA: Diagnosis present

## 2017-02-28 DIAGNOSIS — Z981 Arthrodesis status: Secondary | ICD-10-CM

## 2017-02-28 DIAGNOSIS — Z800 Family history of malignant neoplasm of digestive organs: Secondary | ICD-10-CM

## 2017-02-28 DIAGNOSIS — I252 Old myocardial infarction: Secondary | ICD-10-CM

## 2017-02-28 DIAGNOSIS — Z96641 Presence of right artificial hip joint: Secondary | ICD-10-CM | POA: Diagnosis present

## 2017-02-28 DIAGNOSIS — G8918 Other acute postprocedural pain: Secondary | ICD-10-CM | POA: Diagnosis not present

## 2017-02-28 DIAGNOSIS — Z6841 Body Mass Index (BMI) 40.0 and over, adult: Secondary | ICD-10-CM | POA: Diagnosis not present

## 2017-02-28 DIAGNOSIS — Z7982 Long term (current) use of aspirin: Secondary | ICD-10-CM

## 2017-02-28 DIAGNOSIS — H3530 Unspecified macular degeneration: Secondary | ICD-10-CM | POA: Diagnosis present

## 2017-02-28 DIAGNOSIS — Z791 Long term (current) use of non-steroidal anti-inflammatories (NSAID): Secondary | ICD-10-CM | POA: Diagnosis not present

## 2017-02-28 DIAGNOSIS — M1611 Unilateral primary osteoarthritis, right hip: Secondary | ICD-10-CM | POA: Diagnosis not present

## 2017-02-28 DIAGNOSIS — Z8249 Family history of ischemic heart disease and other diseases of the circulatory system: Secondary | ICD-10-CM | POA: Diagnosis not present

## 2017-02-28 DIAGNOSIS — E785 Hyperlipidemia, unspecified: Secondary | ICD-10-CM | POA: Diagnosis not present

## 2017-02-28 DIAGNOSIS — Z79899 Other long term (current) drug therapy: Secondary | ICD-10-CM | POA: Diagnosis not present

## 2017-02-28 DIAGNOSIS — Z471 Aftercare following joint replacement surgery: Secondary | ICD-10-CM | POA: Diagnosis not present

## 2017-02-28 DIAGNOSIS — Z96652 Presence of left artificial knee joint: Secondary | ICD-10-CM

## 2017-02-28 DIAGNOSIS — M48062 Spinal stenosis, lumbar region with neurogenic claudication: Secondary | ICD-10-CM | POA: Diagnosis not present

## 2017-02-28 HISTORY — PX: TOTAL KNEE ARTHROPLASTY: SHX125

## 2017-02-28 IMAGING — US US SOFT TISSUE HEAD/NECK
1 series · 13 of 25 positions shown · non-contrast
Comparison: None.

CLINICAL DATA: Hypothyroidism and previous demonstration of 0.5 cm
right thyroid nodule. Ultrasound follow-up performed.

EXAM:
THYROID ULTRASOUND
TECHNIQUE: Ultrasound examination of the thyroid gland and adjacent soft
tissues was performed.

[Series 1: us soft tissue head/neck · 0.07mm/px · 13 of 46 slices shown]
[im 1/46]
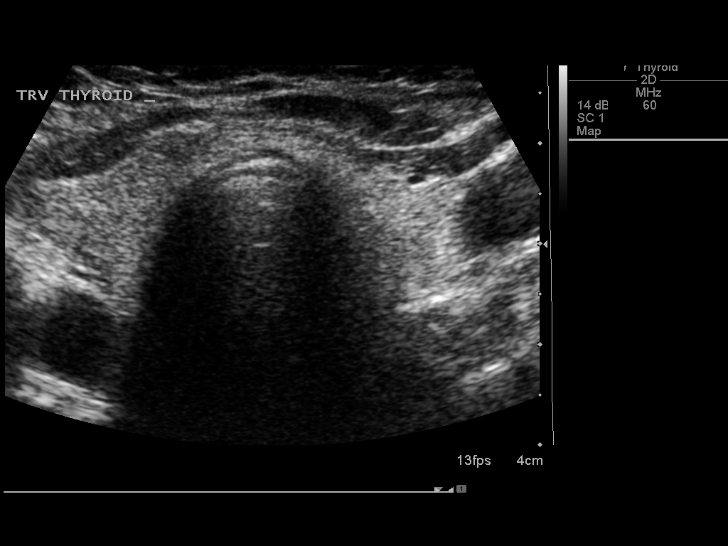
[im 4/46]
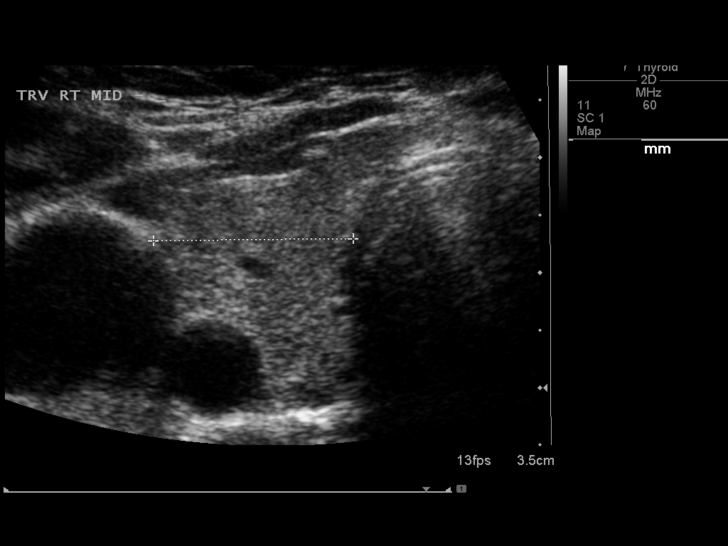
[im 8/46]
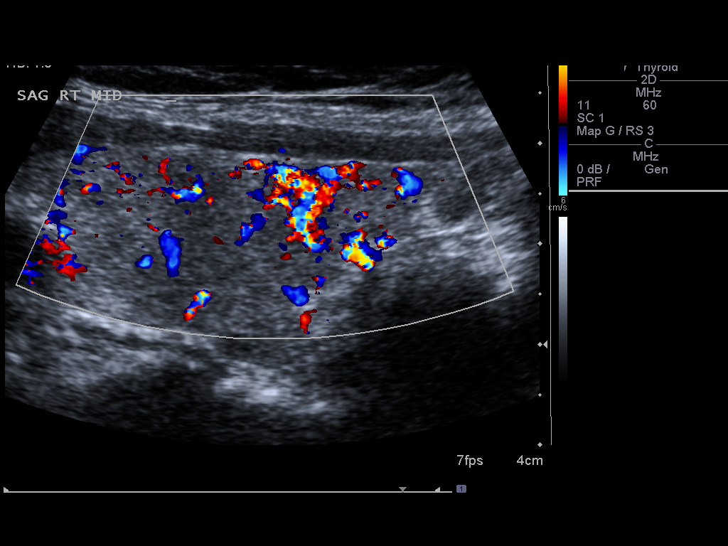
[im 12/46]
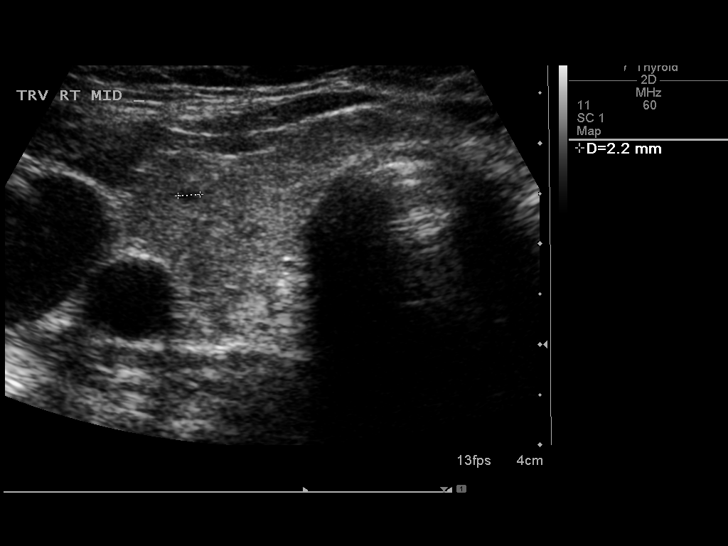
[im 16/46]
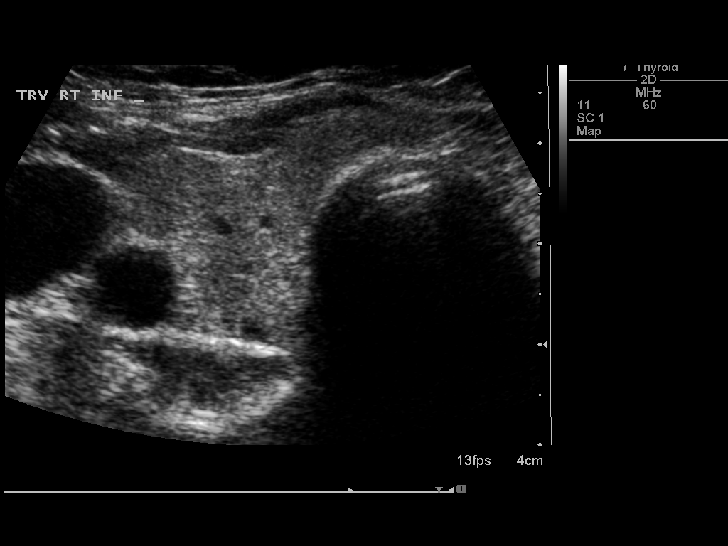
[im 19/46]
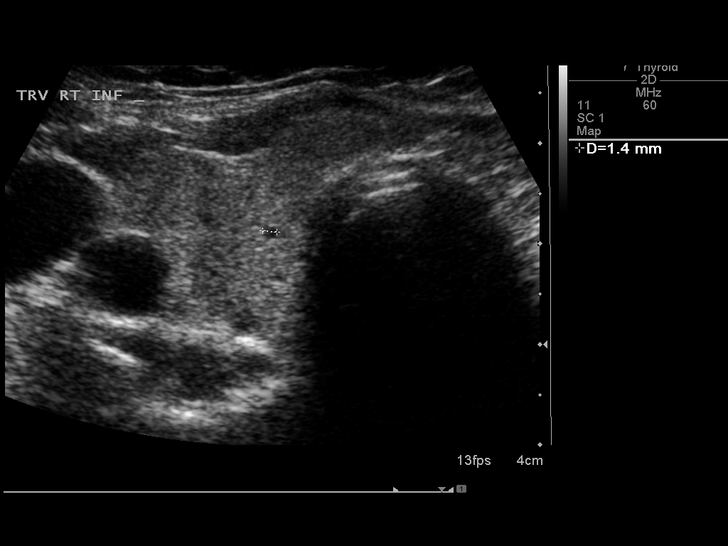
[im 23/46]
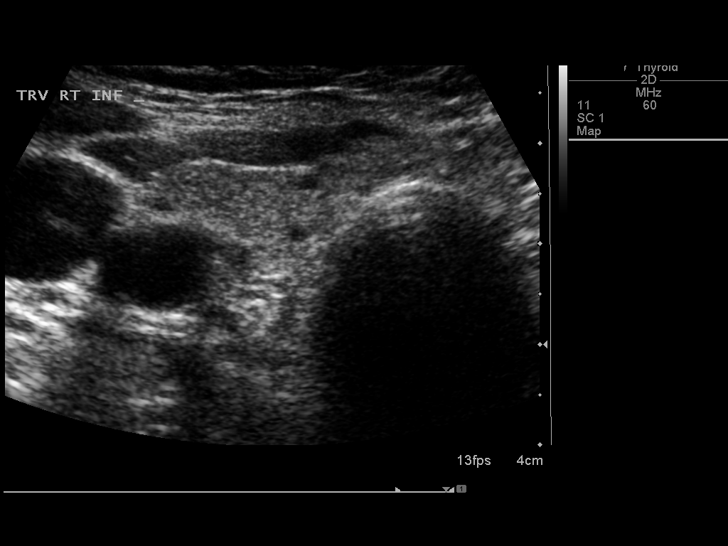
[im 27/46]
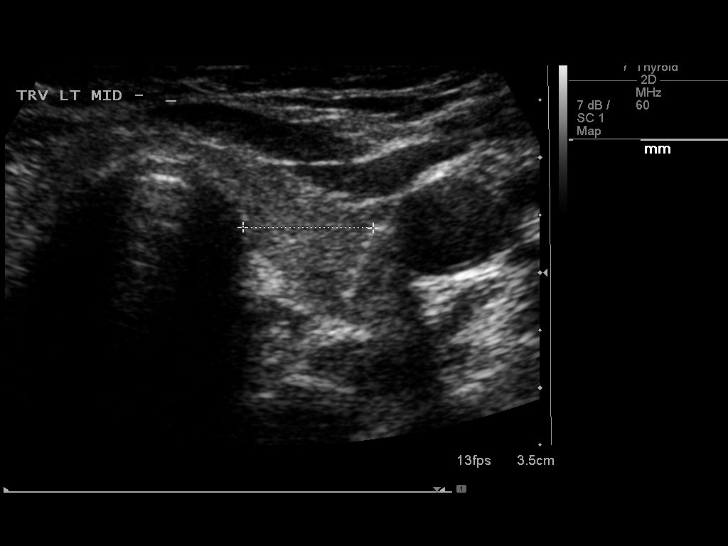
[im 31/46]
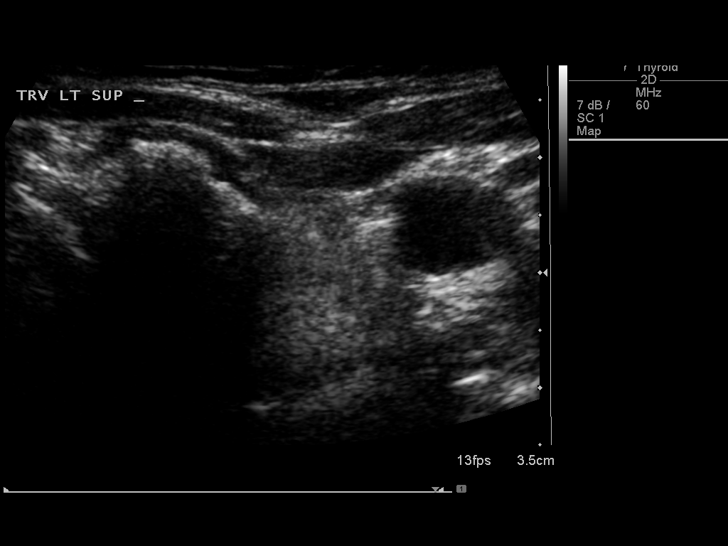
[im 34/46]
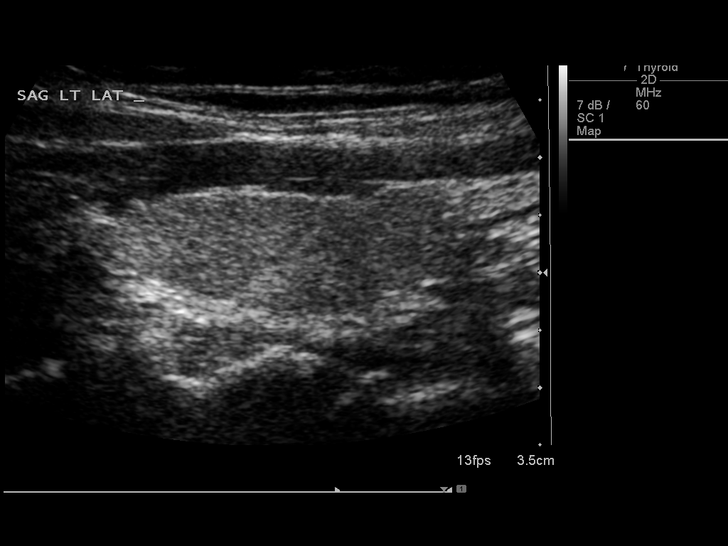
[im 38/46]
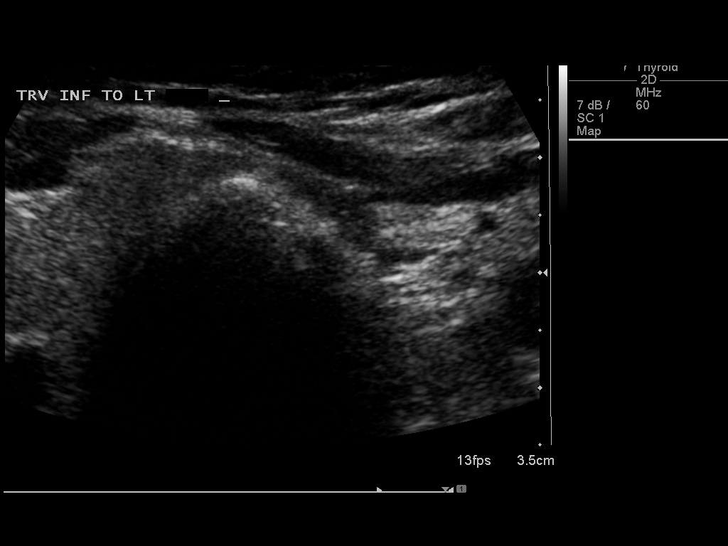
[im 42/46]
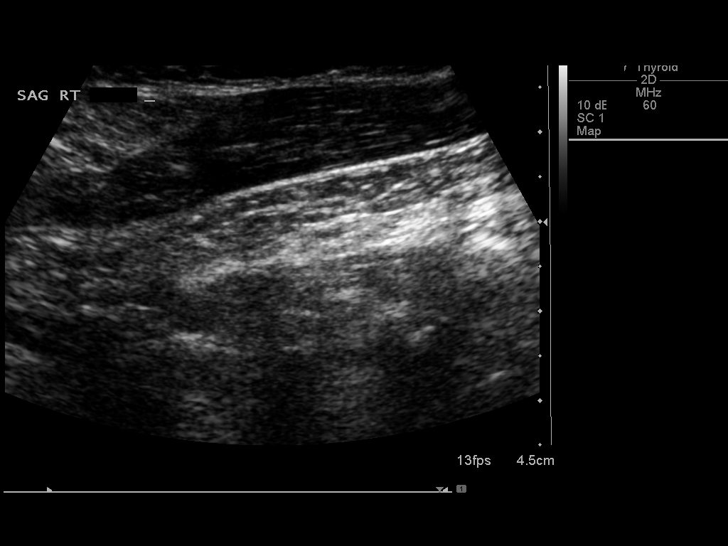
[im 46/46]
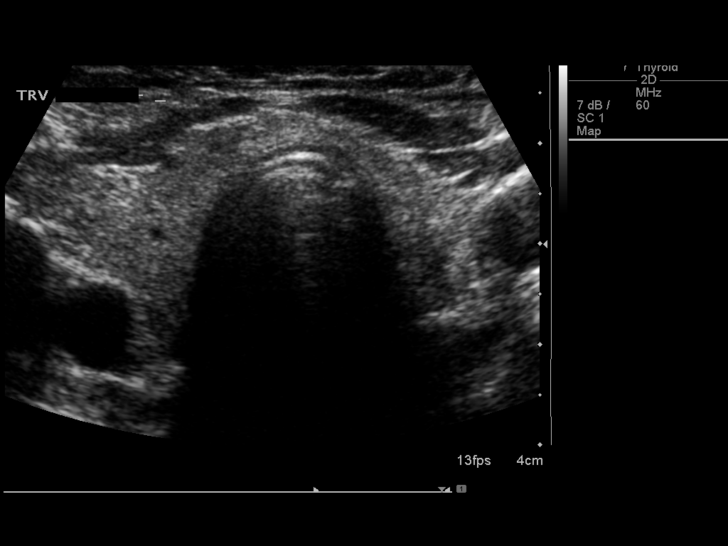

[13 of 25 positions shown; findings below may reference images not displayed]

FINDINGS: Parenchymal Echotexture: Mildly heterogenous

Estimated total number of nodules >/= 1 cm: 0

Number of spongiform nodules > 2 cm not described below (TR1): 0

Number of mixed cystic and solid nodules > 1.5 cm not described
below (TR2): 0

_________________________________________________________

Isthmus: 0.4 cm

No discrete nodules are identified within the thyroid isthmus.

_________________________________________________________

Right lobe: 4.3 x 1.9 x 1.7 cm (stable size).

Nodule # 1:

Prior biopsy: No

Location: Right; Superior

Size: 0.2 cm

Composition: cystic/almost completely cystic (0)

Echogenicity: anechoic (0)

Shape: not taller-than-wide (0)

Margins: smooth (0)

Echogenic foci: none (0)

ACR TI-RADS total points: 0.

ACR TI-RADS risk category: TR1 (0 points).

Change in features: Yes, down significantly smaller and more cystic
in appearance.

Change in ACR TI-RADS risk category: Yes. This is now a completely
benign finding.

ACR TI-RADS recommendations:

This nodule does NOT meet TI-RADS criteria for biopsy or dedicated
follow-up.

Other scattered small cysts in the right lobe all measure 0.4 cm or
less in size and have a benign appearance.

_________________________________________________________

Left lobe: 3.0 x 1.2 x 1.1 cm (stable size).

No discrete nodules are identified within the left lobe of the
thyroid.
IMPRESSION: Involution of previously demonstrated right superior thyroid nodule
which now is a 0.2 cm benign cyst. This is completely benign and
does not require further dedicated imaging follow-up. Other
scattered small cysts in the right lobe are also benign. No solid
nodules identified throughout the thyroid gland. No further thyroid
imaging follow-up necessary unless there is a new clinical
indication to image the thyroid gland.

The above is in keeping with the ACR TI-RADS recommendations - [HOSPITAL] 6975;[DATE].

## 2017-02-28 SURGERY — ARTHROPLASTY, KNEE, TOTAL
Anesthesia: Monitor Anesthesia Care | Site: Knee | Laterality: Left

## 2017-02-28 MED ORDER — PROPOFOL 10 MG/ML IV BOLUS
INTRAVENOUS | Status: AC
  Filled 2017-02-28: qty 20

## 2017-02-28 MED ORDER — PHENYLEPHRINE HCL 10 MG/ML IJ SOLN
INTRAVENOUS | Status: DC | PRN
  Administered 2017-02-28: 40 ug/min via INTRAVENOUS

## 2017-02-28 MED ORDER — TOPIRAMATE 25 MG PO TABS
50.0000 mg | ORAL_TABLET | Freq: Every day | ORAL | Status: DC
  Administered 2017-02-28 – 2017-03-02 (×3): 50 mg via ORAL
  Filled 2017-02-28 (×3): qty 2

## 2017-02-28 MED ORDER — PHENOL 1.4 % MT LIQD: 1.0000 | OROMUCOSAL | Status: DC | PRN

## 2017-02-28 MED ORDER — DULOXETINE HCL 60 MG PO CPEP
60.0000 mg | ORAL_CAPSULE | Freq: Two times a day (BID) | ORAL | Status: DC
  Administered 2017-02-28 – 2017-03-03 (×6): 60 mg via ORAL
  Filled 2017-02-28 (×6): qty 1

## 2017-02-28 MED ORDER — FENTANYL CITRATE (PF) 100 MCG/2ML IJ SOLN
INTRAMUSCULAR | Status: AC
  Filled 2017-02-28: qty 2

## 2017-02-28 MED ORDER — BUPIVACAINE LIPOSOME 1.3 % IJ SUSP
20.0000 mL | Freq: Once | INTRAMUSCULAR | Status: AC
  Administered 2017-02-28: 20 mL
  Filled 2017-02-28: qty 20

## 2017-02-28 MED ORDER — CHLORHEXIDINE GLUCONATE 4 % EX LIQD: 60.0000 mL | Freq: Once | CUTANEOUS | Status: DC

## 2017-02-28 MED ORDER — ACETAMINOPHEN 650 MG RE SUPP: 650.0000 mg | Freq: Four times a day (QID) | RECTAL | Status: DC | PRN

## 2017-02-28 MED ORDER — BUPIVACAINE IN DEXTROSE 0.75-8.25 % IT SOLN
INTRATHECAL | Status: DC | PRN
  Administered 2017-02-28: 2 mL via INTRATHECAL

## 2017-02-28 MED ORDER — ROPIVACAINE HCL 5 MG/ML IJ SOLN
INTRAMUSCULAR | Status: DC | PRN
  Administered 2017-02-28: 30 mL via PERINEURAL

## 2017-02-28 MED ORDER — ONDANSETRON HCL 4 MG/2ML IJ SOLN
INTRAMUSCULAR | Status: AC
  Filled 2017-02-28: qty 2

## 2017-02-28 MED ORDER — LACTATED RINGERS IV SOLN: INTRAVENOUS | Status: DC

## 2017-02-28 MED ORDER — SODIUM CHLORIDE 0.9 % IR SOLN
Status: DC | PRN
  Administered 2017-02-28 (×2): 1000 mL

## 2017-02-28 MED ORDER — ASPIRIN EC 325 MG PO TBEC
325.0000 mg | DELAYED_RELEASE_TABLET | Freq: Two times a day (BID) | ORAL | Status: DC
  Administered 2017-03-01 – 2017-03-03 (×5): 325 mg via ORAL
  Filled 2017-02-28 (×5): qty 1

## 2017-02-28 MED ORDER — PROPOFOL 10 MG/ML IV BOLUS
INTRAVENOUS | Status: AC
  Filled 2017-02-28: qty 40

## 2017-02-28 MED ORDER — TRANEXAMIC ACID 1000 MG/10ML IV SOLN
1000.0000 mg | INTRAVENOUS | Status: AC
  Administered 2017-02-28: 1000 mg via INTRAVENOUS
  Filled 2017-02-28: qty 1100

## 2017-02-28 MED ORDER — TRAZODONE HCL 100 MG PO TABS
100.0000 mg | ORAL_TABLET | Freq: Every day | ORAL | Status: DC
  Administered 2017-02-28 – 2017-03-02 (×3): 100 mg via ORAL
  Filled 2017-02-28 (×3): qty 1

## 2017-02-28 MED ORDER — DOCUSATE SODIUM 100 MG PO CAPS
100.0000 mg | ORAL_CAPSULE | Freq: Two times a day (BID) | ORAL | Status: DC
  Administered 2017-02-28 – 2017-03-03 (×6): 100 mg via ORAL
  Filled 2017-02-28 (×6): qty 1

## 2017-02-28 MED ORDER — METHOCARBAMOL 500 MG PO TABS
500.0000 mg | ORAL_TABLET | Freq: Four times a day (QID) | ORAL | Status: DC | PRN
  Administered 2017-02-28 – 2017-03-03 (×5): 500 mg via ORAL
  Filled 2017-02-28 (×5): qty 1

## 2017-02-28 MED ORDER — LACTATED RINGERS IV SOLN
INTRAVENOUS | Status: DC | PRN
  Administered 2017-02-28 (×2): via INTRAVENOUS

## 2017-02-28 MED ORDER — FENTANYL CITRATE (PF) 100 MCG/2ML IJ SOLN
INTRAMUSCULAR | Status: DC | PRN
  Administered 2017-02-28 (×2): 50 ug via INTRAVENOUS

## 2017-02-28 MED ORDER — METOPROLOL TARTRATE 25 MG PO TABS
25.0000 mg | ORAL_TABLET | Freq: Two times a day (BID) | ORAL | Status: DC
  Administered 2017-02-28 – 2017-03-03 (×6): 25 mg via ORAL
  Filled 2017-02-28 (×6): qty 1

## 2017-02-28 MED ORDER — NITROGLYCERIN 0.4 MG SL SUBL: 0.4000 mg | SUBLINGUAL_TABLET | SUBLINGUAL | Status: DC | PRN

## 2017-02-28 MED ORDER — PROPOFOL 500 MG/50ML IV EMUL
INTRAVENOUS | Status: DC | PRN
  Administered 2017-02-28: 75 ug/kg/min via INTRAVENOUS

## 2017-02-28 MED ORDER — ATORVASTATIN CALCIUM 40 MG PO TABS
40.0000 mg | ORAL_TABLET | Freq: Every day | ORAL | Status: DC
  Administered 2017-03-01 – 2017-03-03 (×3): 40 mg via ORAL
  Filled 2017-02-28 (×3): qty 1

## 2017-02-28 MED ORDER — SODIUM CHLORIDE 0.9 % IJ SOLN
INTRAMUSCULAR | Status: DC | PRN
  Administered 2017-02-28: 40 mL

## 2017-02-28 MED ORDER — METOCLOPRAMIDE HCL 5 MG/ML IJ SOLN: 5.0000 mg | Freq: Three times a day (TID) | INTRAMUSCULAR | Status: DC | PRN

## 2017-02-28 MED ORDER — DEXAMETHASONE SODIUM PHOSPHATE 10 MG/ML IJ SOLN
INTRAMUSCULAR | Status: DC | PRN
  Administered 2017-02-28: 10 mg via INTRAVENOUS

## 2017-02-28 MED ORDER — PROPOFOL 10 MG/ML IV BOLUS
INTRAVENOUS | Status: AC
  Filled 2017-02-28: qty 60

## 2017-02-28 MED ORDER — SODIUM CHLORIDE 0.9 % IJ SOLN
INTRAMUSCULAR | Status: AC
  Filled 2017-02-28: qty 50

## 2017-02-28 MED ORDER — ACETAMINOPHEN 325 MG PO TABS: 650.0000 mg | ORAL_TABLET | Freq: Four times a day (QID) | ORAL | Status: DC | PRN

## 2017-02-28 MED ORDER — ONDANSETRON HCL 4 MG/2ML IJ SOLN
INTRAMUSCULAR | Status: DC | PRN
  Administered 2017-02-28: 4 mg via INTRAVENOUS

## 2017-02-28 MED ORDER — PANTOPRAZOLE SODIUM 40 MG PO TBEC
40.0000 mg | DELAYED_RELEASE_TABLET | Freq: Every day | ORAL | Status: DC
  Administered 2017-03-01 – 2017-03-03 (×3): 40 mg via ORAL
  Filled 2017-02-28 (×3): qty 1

## 2017-02-28 MED ORDER — ONDANSETRON HCL 4 MG/2ML IJ SOLN: 4.0000 mg | Freq: Four times a day (QID) | INTRAMUSCULAR | Status: DC | PRN

## 2017-02-28 MED ORDER — MIDAZOLAM HCL 5 MG/5ML IJ SOLN
INTRAMUSCULAR | Status: DC | PRN
  Administered 2017-02-28 (×2): 1 mg via INTRAVENOUS

## 2017-02-28 MED ORDER — METOCLOPRAMIDE HCL 5 MG PO TABS: 5.0000 mg | ORAL_TABLET | Freq: Three times a day (TID) | ORAL | Status: DC | PRN

## 2017-02-28 MED ORDER — LEVOTHYROXINE SODIUM 50 MCG PO TABS
50.0000 ug | ORAL_TABLET | Freq: Every day | ORAL | Status: DC
  Administered 2017-03-01 – 2017-03-03 (×3): 50 ug via ORAL
  Filled 2017-02-28 (×3): qty 1

## 2017-02-28 MED ORDER — MIDAZOLAM HCL 2 MG/2ML IJ SOLN
INTRAMUSCULAR | Status: AC
  Filled 2017-02-28: qty 2

## 2017-02-28 MED ORDER — HYDROMORPHONE HCL 1 MG/ML IJ SOLN: 0.2500 mg | INTRAMUSCULAR | Status: DC | PRN

## 2017-02-28 MED ORDER — SODIUM CHLORIDE 0.9 % IV SOLN
INTRAVENOUS | Status: DC
  Administered 2017-02-28: 17:00:00 via INTRAVENOUS

## 2017-02-28 MED ORDER — DEXAMETHASONE SODIUM PHOSPHATE 10 MG/ML IJ SOLN
INTRAMUSCULAR | Status: AC
  Filled 2017-02-28: qty 1

## 2017-02-28 MED ORDER — METHOCARBAMOL 1000 MG/10ML IJ SOLN
500.0000 mg | Freq: Four times a day (QID) | INTRAVENOUS | Status: DC | PRN
  Administered 2017-02-28: 500 mg via INTRAVENOUS
  Filled 2017-02-28: qty 550

## 2017-02-28 MED ORDER — CEFAZOLIN SODIUM-DEXTROSE 2-4 GM/100ML-% IV SOLN
2.0000 g | Freq: Four times a day (QID) | INTRAVENOUS | Status: AC
  Administered 2017-02-28 (×2): 2 g via INTRAVENOUS
  Filled 2017-02-28 (×2): qty 100

## 2017-02-28 MED ORDER — ONDANSETRON HCL 4 MG PO TABS: 4.0000 mg | ORAL_TABLET | Freq: Four times a day (QID) | ORAL | Status: DC | PRN

## 2017-02-28 MED ORDER — SODIUM CHLORIDE 0.9 % IR SOLN
Status: DC | PRN
  Administered 2017-02-28: 1000 mL

## 2017-02-28 MED ORDER — PROMETHAZINE HCL 25 MG/ML IJ SOLN: 6.2500 mg | INTRAMUSCULAR | Status: DC | PRN

## 2017-02-28 MED ORDER — DIPHENHYDRAMINE HCL 12.5 MG/5ML PO ELIX: 12.5000 mg | ORAL_SOLUTION | ORAL | Status: DC | PRN

## 2017-02-28 MED ORDER — VITAMIN D3 25 MCG (1000 UNIT) PO TABS
1000.0000 [IU] | ORAL_TABLET | Freq: Every day | ORAL | Status: DC
  Administered 2017-03-01 – 2017-03-03 (×3): 1000 [IU] via ORAL
  Filled 2017-02-28 (×3): qty 1

## 2017-02-28 MED ORDER — OXYCODONE HCL 5 MG PO TABS
5.0000 mg | ORAL_TABLET | ORAL | Status: DC | PRN
  Administered 2017-02-28: 15 mg via ORAL
  Administered 2017-02-28: 5 mg via ORAL
  Administered 2017-02-28 – 2017-03-01 (×2): 10 mg via ORAL
  Administered 2017-03-01: 15 mg via ORAL
  Administered 2017-03-01 – 2017-03-03 (×8): 10 mg via ORAL
  Filled 2017-02-28 (×3): qty 2
  Filled 2017-02-28: qty 3
  Filled 2017-02-28: qty 2
  Filled 2017-02-28: qty 3
  Filled 2017-02-28: qty 2
  Filled 2017-02-28: qty 3
  Filled 2017-02-28 (×2): qty 2
  Filled 2017-02-28: qty 1
  Filled 2017-02-28 (×2): qty 2

## 2017-02-28 MED ORDER — HYDROMORPHONE HCL 1 MG/ML IJ SOLN
1.0000 mg | INTRAMUSCULAR | Status: DC | PRN
  Administered 2017-02-28 – 2017-03-01 (×3): 1 mg via INTRAVENOUS
  Filled 2017-02-28 (×3): qty 1

## 2017-02-28 MED ORDER — MENTHOL 3 MG MT LOZG: 1.0000 | LOZENGE | OROMUCOSAL | Status: DC | PRN

## 2017-02-28 MED ORDER — PROPOFOL 10 MG/ML IV BOLUS
INTRAVENOUS | Status: DC | PRN
  Administered 2017-02-28: 20 mg via INTRAVENOUS
  Administered 2017-02-28: 10 mg via INTRAVENOUS
  Administered 2017-02-28: 20 mg via INTRAVENOUS
  Administered 2017-02-28: 10 mg via INTRAVENOUS

## 2017-02-28 SURGICAL SUPPLY — 46 items
BAG ZIPLOCK 12X15 (MISCELLANEOUS) IMPLANT
BANDAGE ACE 6X5 VEL STRL LF (GAUZE/BANDAGES/DRESSINGS) ×2 IMPLANT
BENZOIN TINCTURE PRP APPL 2/3 (GAUZE/BANDAGES/DRESSINGS) IMPLANT
BLADE SAG 18X100X1.27 (BLADE) IMPLANT
BOWL SMART MIX CTS (DISPOSABLE) ×2 IMPLANT
CAPT KNEE TOTAL 3 ×2 IMPLANT
CEMENT BONE SIMPLEX SPEEDSET (Cement) ×4 IMPLANT
COVER SURGICAL LIGHT HANDLE (MISCELLANEOUS) ×2 IMPLANT
CUFF TOURN SGL QUICK 34 (TOURNIQUET CUFF) ×1
CUFF TRNQT CYL 34X4X40X1 (TOURNIQUET CUFF) ×1 IMPLANT
DRAPE U-SHAPE 47X51 STRL (DRAPES) ×2 IMPLANT
DRSG AQUACEL AG ADV 3.5X10 (GAUZE/BANDAGES/DRESSINGS) ×2 IMPLANT
DRSG PAD ABDOMINAL 8X10 ST (GAUZE/BANDAGES/DRESSINGS) ×2 IMPLANT
DURAPREP 26ML APPLICATOR (WOUND CARE) ×2 IMPLANT
ELECT REM PT RETURN 15FT ADLT (MISCELLANEOUS) ×2 IMPLANT
GAUZE SPONGE 4X4 12PLY STRL (GAUZE/BANDAGES/DRESSINGS) ×2 IMPLANT
GAUZE XEROFORM 1X8 LF (GAUZE/BANDAGES/DRESSINGS) ×2 IMPLANT
GLOVE BIO SURGEON STRL SZ7.5 (GLOVE) ×2 IMPLANT
GLOVE BIOGEL PI IND STRL 8 (GLOVE) ×2 IMPLANT
GLOVE BIOGEL PI INDICATOR 8 (GLOVE) ×2
GLOVE ECLIPSE 8.0 STRL XLNG CF (GLOVE) ×2 IMPLANT
GOWN STRL REUS W/TWL XL LVL3 (GOWN DISPOSABLE) ×4 IMPLANT
HANDPIECE INTERPULSE COAX TIP (DISPOSABLE) ×1
IMMOBILIZER KNEE 20 (SOFTGOODS) ×2
IMMOBILIZER KNEE 20 THIGH 36 (SOFTGOODS) ×1 IMPLANT
INSERT STABLIZER TIBIAL (Insert) ×2 IMPLANT
NS IRRIG 1000ML POUR BTL (IV SOLUTION) ×2 IMPLANT
PACK TOTAL KNEE CUSTOM (KITS) ×2 IMPLANT
PAD ABD 8X10 STRL (GAUZE/BANDAGES/DRESSINGS) ×2 IMPLANT
PADDING CAST COTTON 6X4 STRL (CAST SUPPLIES) ×2 IMPLANT
POSITIONER SURGICAL ARM (MISCELLANEOUS) ×2 IMPLANT
SET HNDPC FAN SPRY TIP SCT (DISPOSABLE) ×1 IMPLANT
SET PAD KNEE POSITIONER (MISCELLANEOUS) ×2 IMPLANT
STAPLER VISISTAT 35W (STAPLE) ×2 IMPLANT
STRIP CLOSURE SKIN 1/2X4 (GAUZE/BANDAGES/DRESSINGS) IMPLANT
SUT MNCRL AB 4-0 PS2 18 (SUTURE) IMPLANT
SUT VIC AB 0 CT1 27 (SUTURE) ×1
SUT VIC AB 0 CT1 27XBRD ANTBC (SUTURE) ×1 IMPLANT
SUT VIC AB 1 CT1 27 (SUTURE) ×2
SUT VIC AB 1 CT1 27XBRD ANTBC (SUTURE) ×2 IMPLANT
SUT VIC AB 2-0 CT1 27 (SUTURE) ×2
SUT VIC AB 2-0 CT1 TAPERPNT 27 (SUTURE) ×2 IMPLANT
TRAY FOLEY CATH 14FR (SET/KITS/TRAYS/PACK) ×2 IMPLANT
WATER STERILE IRR 1500ML POUR (IV SOLUTION) ×2 IMPLANT
WRAP KNEE MAXI GEL POST OP (GAUZE/BANDAGES/DRESSINGS) ×2 IMPLANT
YANKAUER SUCT BULB TIP 10FT TU (MISCELLANEOUS) ×2 IMPLANT

## 2017-02-28 NOTE — Anesthesia Postprocedure Evaluation (Signed)
Anesthesia Post Note  Patient: Dawn Davidson  Procedure(s) Performed: Procedure(s) (LRB): LEFT TOTAL KNEE ARTHROPLASTY (Left)     Patient location during evaluation: PACU Anesthesia Type: Spinal Level of consciousness: awake and alert Pain management: pain level controlled Vital Signs Assessment: post-procedure vital signs reviewed and stable Respiratory status: spontaneous breathing and respiratory function stable Cardiovascular status: blood pressure returned to baseline and stable Postop Assessment: spinal receding Anesthetic complications: no    Last Vitals:  Vitals:   02/28/17 1202 02/28/17 1302  BP: (!) 131/57 (!) 145/60  Pulse: 84 90  Resp: 17 16  Temp: 36.4 C 36.8 C    Last Pain:  Vitals:   02/28/17 1302  TempSrc: Oral                 Tiajuana Amass

## 2017-02-28 NOTE — Brief Op Note (Signed)
02/28/2017  9:57 AM  PATIENT:  Dawn Davidson  66 y.o. female  PRE-OPERATIVE DIAGNOSIS:  osteoarthritis left knee  POST-OPERATIVE DIAGNOSIS:  osteoarthritis left knee  PROCEDURE:  Procedure(s): LEFT TOTAL KNEE ARTHROPLASTY (Left)  SURGEON:  Surgeon(s) and Role:    Mcarthur Rossetti, MD - Primary  PHYSICIAN ASSISTANT: Benita Stabile, PA-C  ANESTHESIA:   local, regional and spinal  EBL:  Total I/O In: 1000 [I.V.:1000] Out: 300 [Urine:200; Blood:100]  COUNTS:  YES  TOURNIQUET:   Total Tourniquet Time Documented: Thigh (Left) - 96 minutes Total: Thigh (Left) - 96 minutes   DICTATION: .Other Dictation: Dictation Number [id number]  PLAN OF CARE: Admit to inpatient   PATIENT DISPOSITION:  PACU - hemodynamically stable.   Delay start of Pharmacological VTE agent (>24hrs) due to surgical blood loss or risk of bleeding: no

## 2017-02-28 NOTE — Evaluation (Signed)
Physical Therapy Evaluation Patient Details Name: Dawn Davidson MRN: [id number] DOB: [birth year] Today's Date: 02/28/2017   History of Present Illness  Pt s/p L TKR and with hx of MI, CAD, chronic LBP, R THR (3/18), R TKR and lumbar fusion  Clinical Impression  Pt s/p L TKR and presents with decreased L LE strength/ROM and post op pain limiting functional mobility.  Pt should progress to dc home with assist of family/friends and HHPT follow up.    Follow Up Recommendations Home health PT    Equipment Recommendations  None recommended by PT    Recommendations for Other Services OT consult     Precautions / Restrictions Precautions Precautions: Fall;Knee Required Braces or Orthoses: Knee Immobilizer - Left Knee Immobilizer - Left: Discontinue once straight leg raise with < 10 degree lag Restrictions Weight Bearing Restrictions: No Other Position/Activity Restrictions: WBAT      Mobility  Bed Mobility Overal bed mobility: Needs Assistance Bed Mobility: Supine to Sit     Supine to sit: Min assist     General bed mobility comments: cues for sequence and use of rail  Transfers Overall transfer level: Needs assistance Equipment used: Rolling walker (2 wheeled) Transfers: Sit to/from Stand Sit to Stand: Min assist;From elevated surface         General transfer comment: cues for LE management and use of UEs to self assist  Ambulation/Gait Ambulation/Gait assistance: Min assist Ambulation Distance (Feet): 30 Feet Assistive device: Rolling walker (2 wheeled) Gait Pattern/deviations: Step-to pattern;Decreased step length - right;Decreased step length - left;Shuffle;Trunk flexed Gait velocity: decr Gait velocity interpretation: Below normal speed for age/gender General Gait Details: cues for sequence, posture and position from [employer]            Wheelchair Mobility    Modified Rankin (Stroke Patients Only)       Balance                                             Pertinent Vitals/Pain Pain Assessment: 0-10 Pain Score: 4  Pain Location: L knee Pain Descriptors / Indicators: Aching;Sore Pain Intervention(s): Limited activity within patient's tolerance;Monitored during session;Premedicated before session;Ice applied    Home Living Family/patient expects to be discharged to:: Private residence Living Arrangements: Non-relatives/Friends Available Help at Discharge: Family;Friend(s) Type of Home: House Home Access: Stairs to enter   [employer] of Steps: 1 Home Layout: Two level Home Equipment: Environmental consultant - 2 wheels;Walker - 4 wheels;Cane - quad;Shower seat;Grab bars - tub/shower;Adaptive equipment Additional Comments: lives with 22 y.o. grandson     Prior Function Level of Independence: Independent with assistive device(s)         Comments: using RW prior to surgery     Hand Dominance   Dominant Hand: Right    Extremity/Trunk Assessment   Upper Extremity Assessment Upper Extremity Assessment: Overall WFL for tasks assessed    Lower Extremity Assessment Lower Extremity Assessment: LLE deficits/detail    Cervical / Trunk Assessment Cervical / Trunk Assessment: Normal  Communication   Communication: No difficulties  Cognition Arousal/Alertness: Awake/alert Behavior During Therapy: WFL for tasks assessed/performed Overall Cognitive Status: Within Functional Limits for tasks assessed  General Comments      Exercises Total Joint Exercises Ankle Circles/Pumps: AROM;Both;15 reps;Supine   Assessment/Plan    PT Assessment Patient needs continued PT services  PT Problem List Decreased strength;Decreased range of motion;Decreased activity tolerance;Decreased mobility;Decreased knowledge of use of DME;Pain;Obesity       PT Treatment Interventions DME instruction;Gait training;Stair training;Functional mobility training;Therapeutic  activities;Therapeutic exercise;Patient/family education    PT Goals (Current goals can be found in the Care Plan section)  Acute Rehab PT Goals Patient Stated Goal: Regain IND  PT Goal Formulation: With patient Time For Goal Achievement: 03/05/17 Potential to Achieve Goals: Good    Frequency 7X/week   Barriers to discharge        Co-evaluation               AM-PAC PT "6 Clicks" Daily Activity  Outcome Measure Difficulty turning over in bed (including adjusting bedclothes, sheets and blankets)?: Total Difficulty moving from lying on back to sitting on the side of the bed? : Total Difficulty sitting down on and standing up from a chair with arms (e.g., wheelchair, bedside commode, etc,.)?: Total Help needed moving to and from a bed to chair (including a wheelchair)?: A Little Help needed walking in hospital room?: A Little Help needed climbing 3-5 steps with a railing? : A Lot 6 Click Score: 11    End of Session Equipment Utilized During Treatment: Gait belt;Left knee immobilizer Activity Tolerance: Patient tolerated treatment well Patient left: in chair;with chair alarm set;with call bell/phone within reach Nurse Communication: Mobility status PT Visit Diagnosis: Difficulty in walking, not elsewhere classified (R26.2)    Time: 7672-0947 PT Time Calculation (min) (ACUTE ONLY): 24 min   Charges:   PT Evaluation $PT Eval Low Complexity: 1 Procedure PT Treatments $Gait Training: 8-22 mins   PT G Codes:        Pg [phone]   Malli Falotico 02/28/2017, 5:06 PM

## 2017-02-28 NOTE — Anesthesia Procedure Notes (Signed)
Anesthesia Regional Block: Adductor canal block   Pre-Anesthetic Checklist: ,, timeout performed, Correct Patient, Correct Site, Correct Laterality, Correct Procedure, Correct Position, site marked, Risks and benefits discussed,  Surgical consent,  Pre-op evaluation,  At surgeon's request and post-op pain management  Laterality: Left  Prep: chloraprep       Needles:  Injection technique: Single-shot  Needle Type: Echogenic Needle     Needle Length: 9cm  Needle Gauge: 21     Additional Needles:   Procedures: ultrasound guided,,,,,,,,  Narrative:  Start time: 02/28/2017 7:04 AM End time: 02/28/2017 7:11 AM Injection made incrementally with aspirations every 5 mL.  Performed by: Personally  Anesthesiologist: Suzette Battiest

## 2017-02-28 NOTE — Transfer of Care (Signed)
Immediate Anesthesia Transfer of Care Note  Patient: Dawn Davidson  Procedure(s) Performed: Procedure(s): LEFT TOTAL KNEE ARTHROPLASTY (Left)  Patient Location: PACU  Anesthesia Type:Spinal  Level of Consciousness:  sedated, patient cooperative and responds to stimulation  Airway & Oxygen Therapy:Patient Spontanous Breathing and Patient connected to face mask oxgen  Post-op Assessment:  Report given to PACU RN and Post -op Vital signs reviewed and stable  Post vital signs:  Reviewed and stable  Last Vitals:  Vitals:   02/28/17 0549 02/28/17 1020  BP: (!) 165/51 135/83  Pulse: 74 95  Resp: 16 18  Temp: 76.3 C     Complications: No apparent anesthesia complications

## 2017-02-28 NOTE — Addendum Note (Signed)
Addendum  created 02/28/17 1431 by Anne Fu, CRNA   Anesthesia Intra Meds edited

## 2017-02-28 NOTE — Anesthesia Procedure Notes (Addendum)
Spinal  Patient location during procedure: OR Start time: 02/28/2017 7:28 AM End time: 02/28/2017 7:38 AM Staffing Anesthesiologist: Suzette Battiest Performed: anesthesiologist  Preanesthetic Checklist Completed: patient identified, site marked, surgical consent, pre-op evaluation, timeout performed, IV checked, risks and benefits discussed and monitors and equipment checked Spinal Block Patient position: sitting Prep: site prepped and draped and DuraPrep Patient monitoring: heart rate, continuous pulse ox and blood pressure Approach: midline Location: L2-3 Injection technique: single-shot Needle Needle type: [place]  Needle gauge: 22 G Needle length: 9 cm Needle insertion depth: 9 cm

## 2017-02-28 NOTE — H&P (Signed)
TOTAL KNEE ADMISSION H&P  Patient is being admitted for left total knee arthroplasty.  Subjective:  Chief Complaint:left knee pain.  HPI: Dawn Davidson, 66 y.o. female, has a history of pain and functional disability in the left knee due to arthritis and has failed non-surgical conservative treatments for greater than 12 weeks to includeNSAID's and/or analgesics, corticosteriod injections, viscosupplementation injections, flexibility and strengthening excercises, use of assistive devices, weight reduction as appropriate and activity modification.  Onset of symptoms was gradual, starting 3 years ago with gradually worsening course since that time. The patient noted no past surgery on the left knee(s).  Patient currently rates pain in the left knee(s) at 10 out of 10 with activity. Patient has night pain, worsening of pain with activity and weight bearing, pain that interferes with activities of daily living, pain with passive range of motion, crepitus and joint swelling.  Patient has evidence of subchondral sclerosis, periarticular osteophytes and joint space narrowing by imaging studies. There is no active infection.  Patient Active Problem List   Diagnosis Date Noted  . Unilateral primary osteoarthritis, left knee 01/15/2017  . Unilateral primary osteoarthritis, right hip 11/01/2016  . Status post total replacement of right hip 11/01/2016  . 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 Lumbar stenosis with neurogenic claudication 08/18/2015  . Morbid obesity ([place]) 05/10/2015   Past Medical History:  Diagnosis Date  . Anxiety   . Arthritis    "neck, back, hands, knees, hips" (08/18/2015)  . 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 Chronic lower back pain   . Coronary artery disease    2010 MI  . Depression   . Family history of adverse reaction to anesthesia    her mother gets sick  . GERD (gastroesophageal reflux disease)   . Hyperlipidemia   .  Hypertension   . Hypothyroidism since 02/2015  . Macular degeneration, bilateral   . Migraine    "q couple weeks recently" (08/18/2015)  . Morbid obesity ([place]) 05/10/2015  . Myocardial infarction ([place]) 2012  . Temporary low platelet count ([place]) 2012   checked every 6 mths...kept her in for 1 week with MI, and she's not sure exactly why    Past Surgical History:  Procedure Laterality Date  . BACK SURGERY    . BREAST BIOPSY Left 08/2015  . BREAST LUMPECTOMY WITH RADIOACTIVE SEED LOCALIZATION Left 11/23/2015   Procedure: LEFT BREAST LUMPECTOMY WITH RADIOACTIVE SEED LOCALIZATION;  Surgeon: Fanny Skates, MD;  Location: [facility];  Service: General;  Laterality: Left;  . CARDIAC CATHETERIZATION  2013  . CHOLECYSTECTOMY OPEN    . CORONARY ANGIOPLASTY WITH STENT PLACEMENT  2013   12/09/11 95% stenosis proximal RCA->DES; otherwise no significant CAD  . EYE SURGERY     cataract right eye   . GASTRIC RESTRICTION SURGERY  1974  . JOINT REPLACEMENT Right    knee  . MAXIMUM ACCESS (MAS)POSTERIOR LUMBAR INTERBODY FUSION (PLIF) 1 LEVEL N/A 08/18/2015   Procedure: Lumbar four-five Maximum access posterior lumbar interbody fusion;  Surgeon: Erline Levine, MD;  Location: [facility];  Service: Neurosurgery;  Laterality: N/A;  L4-5 Maximum access posterior lumbar interbody fusion  . REPLACEMENT TOTAL KNEE Right 2007  . TONSILLECTOMY    . TOTAL HIP ARTHROPLASTY Right 11/01/2016   Procedure: RIGHT TOTAL HIP ARTHROPLASTY ANTERIOR APPROACH;  Surgeon: Mcarthur Rossetti, MD;  Location: [facility];  Service: Orthopedics;  Laterality: Right;    Prescriptions Prior to Admission  Medication Sig Dispense Refill Last  Dose  . acetaminophen-codeine (TYLENOL #3) 300-30 MG tablet TAKE 1 TO 2 TABLETS BY MOUTH EVERY 8 HOURS AS NEEDED FOR MODERATE PAIN 60 tablet 0 02/27/2017 at Unknown time  . aspirin EC 81 MG tablet Take 81 mg by mouth at bedtime.   02/21/2017 at Unknown time  . atorvastatin (LIPITOR) 40 MG tablet Take 40 mg by  mouth daily.   3 02/28/2017 at 0620  . cholecalciferol (VITAMIN D) 1000 UNITS tablet Take 1,000 Units by mouth daily.   02/28/2017 at 0620  . DULoxetine (CYMBALTA) 60 MG capsule Take 60 mg by mouth 2 (two) times daily.   02/28/2017 at 0620  . levothyroxine (SYNTHROID, LEVOTHROID) 50 MCG tablet Take 50 mcg by mouth daily before breakfast.   2 02/28/2017 at 0500  . meloxicam (MOBIC) 15 MG tablet Take 1 tablet (15 mg total) by mouth 2 (two) times daily between meals as needed for pain. (Patient taking differently: Take 15 mg by mouth daily. ) 30 tablet 1 02/21/2017 at Unknown time  . methocarbamol (ROBAXIN) 500 MG tablet TAKE 1 TABLET BY MOUTH EVERY 6 HOURS AS NEEDED FOR MUSCLE SPASMS 60 tablet 0 02/27/2017 at Unknown time  . metoprolol tartrate (LOPRESSOR) 25 MG tablet Take 25 mg by mouth 2 (two) times daily.    02/28/2017 at 0620  . omeprazole (PRILOSEC) 20 MG capsule Take 20 mg by mouth daily before breakfast.    02/28/2017 at 0620  . topiramate (TOPAMAX) 50 MG tablet Take 50 mg by mouth at bedtime.   02/27/2017 at Unknown time  . traZODone (DESYREL) 100 MG tablet Take 100 mg by mouth at bedtime.   02/27/2017 at Unknown time  . nitroGLYCERIN (NITROSTAT) 0.4 MG SL tablet Place 0.4 mg under the tongue every 5 (five) minutes as needed for chest pain. Reported on 10/09/2015   Unknown at Unknown time  . oxyCODONE-acetaminophen (ROXICET) 5-325 MG tablet Take 1-2 tablets by mouth every 4 (four) hours as needed. (Patient not taking: Reported on 02/19/2017) 60 tablet 0 Not Taking at Unknown time   Allergies  Allergen Reactions  . Sulfa Antibiotics Shortness Of Breath    Social History  Substance Use Topics  . Smoking status: Never Smoker  . Smokeless tobacco: Never Used  . Alcohol use No    Family History  Problem Relation Age of Onset  . Stomach cancer Mother   . Heart disease Sister   . Cancer Brother        X3 BROTHERS  . Bipolar disorder Daughter   . Bipolar disorder Daughter   . Thyroid disease  Daughter   . Thyroid disease Grandchild      Review of Systems  Musculoskeletal: Positive for joint pain.  All other systems reviewed and are negative.   Objective:  Physical Exam  Constitutional: She is oriented to person, place, and time. She appears well-developed and well-nourished.  HENT:  Head: Normocephalic and atraumatic.  Eyes: EOM are normal. Pupils are equal, round, and reactive to light.  Neck: Normal range of motion. Neck supple.  Cardiovascular: Normal rate and regular rhythm.   Respiratory: Effort normal and breath sounds normal.  GI: Soft. Bowel sounds are normal.  Musculoskeletal:       Left knee: She exhibits decreased range of motion and effusion. Tenderness found. Medial joint line and lateral joint line tenderness noted.  Neurological: She is alert and oriented to person, place, and time.  Skin: Skin is warm and dry.  Psychiatric: She has a normal mood and affect.  Vital signs in last 24 hours: Temp:  [98 F (36.7 C)] 98 F (36.7 C) (06/29 0549) Pulse Rate:  [74] 74 (06/29 0549) Resp:  [16] 16 (06/29 0549) BP: (165)/(51) 165/51 (06/29 0549) SpO2:  [97 %] 97 % (06/29 0549) Weight:  [273 lb (123.8 kg)] 273 lb (123.8 kg) (06/29 0626)  Labs:   Estimated body mass index is 46.86 kg/m as calculated from the following:   Height as of this encounter: 5\' 4"  (1.626 m).   Weight as of this encounter: 273 lb (123.8 kg).   Imaging Review Plain radiographs demonstrate severe degenerative joint disease of the left knee(s). The overall alignment isneutral. The bone quality appears to be good for age and reported activity level.  Assessment/Plan:  End stage arthritis, left knee   The patient history, physical examination, clinical judgment of the provider and imaging studies are consistent with end stage degenerative joint disease of the left knee(s) and total knee arthroplasty is deemed medically necessary. The treatment options including medical  management, injection therapy arthroscopy and arthroplasty were discussed at length. The risks and benefits of total knee arthroplasty were presented and reviewed. The risks due to aseptic loosening, infection, stiffness, patella tracking problems, thromboembolic complications and other imponderables were discussed. The patient acknowledged the explanation, agreed to proceed with the plan and consent was signed. Patient is being admitted for inpatient treatment for surgery, pain control, PT, OT, prophylactic antibiotics, VTE prophylaxis, progressive ambulation and ADL's and discharge planning. The patient is planning to be discharged to skilled nursing facility

## 2017-02-28 NOTE — Progress Notes (Signed)
Assisted Dr. Rob Fitzgerald with left, ultrasound guided, adductor canal block. Side rails up, monitors on throughout procedure. See vital signs in flow sheet. Tolerated Procedure well.  

## 2017-02-28 NOTE — Op Note (Signed)
Dawn Davidson, Dawn Davidson                   ACCOUNT NO.:  MEDICAL RECORD NO.:  [id number]  LOCATION:                                 FACILITY:  PHYSICIAN:  Lind Guest. Ninfa Linden, M.D.DATE OF BIRTH:  1951-04-30  DATE OF PROCEDURE:  02/28/2017 DATE OF DISCHARGE:                              OPERATIVE REPORT   PREOPERATIVE DIAGNOSIS:  Primary osteoarthritis and degenerative joint disease, left knee.  POSTOPERATIVE DIAGNOSIS:  Primary osteoarthritis and degenerative joint disease, left knee.  PROCEDURE:  Left total knee arthroplasty.  IMPLANTS:  Stryker Triathlon knee with size 5 femur, size 5 tibial tray, 11 mm fix bearing constrained polyethylene insert, size 32 patellar button.  SURGEON:  Lind Guest. Ninfa Linden, M.D.  ASSISTANT:  Erskine Emery, PA-C.  ANESTHESIA: 1. Left lower extremity adductor canal block. 2. Spinal. 3. Exparel local injection in the knee joint.  TOURNIQUET TIME:  One hour and a half.  ANTIBIOTICS:  IV Ancef 3 g.  BLOOD LOSS:  Less than 200 mL.  COMPLICATIONS:  None.  INDICATIONS:  Ms. Openshaw is a 66 year old, was significantly deformed knee due to severe osteoarthritis and degenerative joint disease.  She has tried and failed all forms of conservative treatment.  Her pain is daily and it has detrimentally affected her activities of daily living, her quality of life, and her mobility.  At this point, with failure of conservative treatment.  She does wish to proceed with a total knee arthroplasty on the left side.  She has had this done elsewhere on the right side and actually performed a total hip ball in the past.  She understands fully the risks of acute blood loss anemia, nerve and vessel injury, fracture, infection, DVT.  She understands our goals are to decrease pain, improved mobility, and overall improved quality of life.  PROCEDURE DESCRIPTION:  After informed consent was obtained, appropriate left knee was marked.  Anesthesia was  obtained, an adductor canal block in the holding room.  She was brought to the operating room and sat up on the operating table and spinal anesthesia was obtained.  She was laid in supine position.  A Foley catheter was placed.  A nonsterile tourniquet was placed around her upper left thigh.  Her left leg was then prepped and draped with DuraPrep and sterile drapes including sterile stockinette.  A time-out was called.  She was identified as correct patient and correct left knee.  We then used Esmarch to wrap out the leg and tourniquet was inflated to 300 mm of pressure.  I then made an incision over the patella and carried this proximally and distally. We dissected down the knee joint and carried out a medial parapatellar arthrotomy and found a large joint effusion and significant osteophytes throughout the knee.  We found a macerated medial and lateral meniscus as well as ACL and PCL were worn down.  She had a flexion contracture of her knee as well.  With the knee in a flexed position, we made our proximal tibia cut trying to correct for neutral slope, and varus and valgus.  We had to make actually several cuts of the distal tibia due to the tightness of the  knee and deformity to our guide tower the we had comfortable tibia cut.  We then went to the femur.  With the knee in a flexed position using an intramedullary guide to the femur, we are making our distal femoral cut at 10 mm distal femoral cut setting the guide 5 degrees, externally rotated.  We made this cut without difficulty and then I took 2 more millimeters off the femur as well.  I put a 9 mm and 11 mm extension block with the knee in flexion and extension, I felt we were balanced after placing the femoral sizing guide based off the epicondylar axis and choosing a size 5 femur.  We put a 4-in-1 cutting block for a size 5 femur and made our anterior and posterior cuts, followed by our chamfer cuts and then cleaned significant  osteophytes around the knee.  Again, I was pleased with our flexion and extension gaps with an 11 mm polyethylene insert.  We went back to the tibia and chose a 5 tibia setting our rotation off the femur and the tibial tubercle.  We drilled for universal base plate post we made our keel punch as well.  With the trial size 5 tibia followed by the 5 femur, we placed our 11 mm fix-bearing polyethylene insert trial and we were pleased with the range of motion and stability.  We then made our patellar cut and drilled 3 holes for a size 32 patellar button. I removed all trial instrumentation from the knee and irrigated the knee with normal saline solution.  We placed our Exparel around the knee joint and mixed our cement and cemented the real Stryker Triathlon universal base plate for the tibia size 5, followed by the real size 5 femur.  We placed the real 11 mm constrained fix-bearing polyethylene insert, and then cemented our patellar button.  Once the cement had hardened, we did remove cement debris from the knee and irrigated the knee again with normal saline solution.  The tourniquet was let down and hemostasis was obtained with electrocautery.  We then closed the arthrotomy with interrupted #1 Vicryl suture, followed by 0 Vicryl in the deep tissue, 2-0 Vicryl subcutaneous tissue, interrupted staples on the skin, and well-padded sterile dressing was applied.  She was taken to recovery room in stable condition.  All final counts were correct. There were no complications noted.   Of note, Erskine Emery, PA-C, assisted during the entire case.  His assistance was crucial for facilitating all aspects of this case.     Lind Guest. Ninfa Linden, M.D.     CYB/MEDQ  D:  02/28/2017  T:  02/28/2017  Job:  [id number]

## 2017-03-01 LAB — CBC
HCT: 33.3 % — ABNORMAL LOW (ref 36.0–46.0)
Hemoglobin: 10.4 g/dL — ABNORMAL LOW (ref 12.0–15.0)
MCH: 25 pg — ABNORMAL LOW (ref 26.0–34.0)
MCHC: 31.2 g/dL (ref 30.0–36.0)
MCV: 80 fL (ref 78.0–100.0)
Platelets: 184 10*3/uL (ref 150–400)
RBC: 4.16 MIL/uL (ref 3.87–5.11)
RDW: 14.2 % (ref 11.5–15.5)
WBC: 11.4 10*3/uL — ABNORMAL HIGH (ref 4.0–10.5)

## 2017-03-01 LAB — BASIC METABOLIC PANEL
Anion gap: 6 (ref 5–15)
BUN: 19 mg/dL (ref 6–20)
CO2: 27 mmol/L (ref 22–32)
Calcium: 7.9 mg/dL — ABNORMAL LOW (ref 8.9–10.3)
Chloride: 104 mmol/L (ref 101–111)
Creatinine, Ser: 0.89 mg/dL (ref 0.44–1.00)
GFR calc Af Amer: >60 mL/min (ref >60)
GFR calc non Af Amer: >60 mL/min (ref >60)
Glucose, Bld: 144 mg/dL — ABNORMAL HIGH (ref 65–99)
Potassium: 4.3 mmol/L (ref 3.5–5.1)
Sodium: 137 mmol/L (ref 135–145)

## 2017-03-01 MED ORDER — LIP MEDEX EX OINT
TOPICAL_OINTMENT | CUTANEOUS | Status: AC
  Filled 2017-03-01: qty 7

## 2017-03-01 NOTE — Care Management Note (Addendum)
Case Management Note  Patient Details  Name: Dawn Davidson MRN: [id number] Date of Birth: 1951/03/15  Subjective/Objective:    S/p L TKR                Action/Plan: Discharge Planning: NCM spoke to pt and states she has limited assistance at home but feels she can management with HHPT. Will wait to see if [facility] PT vs IP rehab. Offered choice for [facility]. Pt agreeable to [facility]. Pt has RW and 3n1 at home. Pt has a Medicare product. Will need HHPT orders with [facility] to be completed for insurance to cover [place].   PCP Jonathon Jordan MD  Expected Discharge Date:                  Expected Discharge Plan:  [place]  In-House Referral:  NA  Discharge planning Services  CM Consult  Post Acute Care Choice:  Home Health Choice offered to:  Patient  DME Arranged:  N/A DME Agency:  NA  HH Arranged:  PT [place] Agency:  [facility] (formerly [facility])  Status of Service:  Completed, signed off  If discussed at [employer] of Stay Meetings, dates discussed:    Additional Comments:  Erenest Rasher, RN 03/01/2017, 12:07 PM

## 2017-03-01 NOTE — Discharge Instructions (Signed)

## 2017-03-01 NOTE — Progress Notes (Signed)
Physical Therapy Treatment Patient Details Name: Dawn Davidson MRN: [id number] DOB: 1951/02/22 Today's Date: 03/01/2017    History of Present Illness Pt s/p L TKR and with hx of MI, CAD, chronic LBP, R THR (3/18), R TKR and lumbar fusion    PT Comments    Pt very motivated, progressing steadily and hopeful for dc home Monday.   Follow Up Recommendations  Home health PT     Equipment Recommendations  None recommended by PT    Recommendations for Other Services OT consult     Precautions / Restrictions Precautions Precautions: Fall;Knee Required Braces or Orthoses: Knee Immobilizer - Left Knee Immobilizer - Left: Discontinue once straight leg raise with < 10 degree lag Restrictions Weight Bearing Restrictions: No Other Position/Activity Restrictions: WBAT    Mobility  Bed Mobility Overal bed mobility: Needs Assistance Bed Mobility: Sit to Supine       Sit to supine: Min guard   General bed mobility comments: Increased time and cues for sequence and use of rail  Transfers Overall transfer level: Needs assistance Equipment used: Rolling walker (2 wheeled) Transfers: Sit to/from Stand Sit to Stand: Min guard         General transfer comment: cues for LE management and use of UEs to self assist  Ambulation/Gait Ambulation/Gait assistance: Min assist;Min guard Ambulation Distance (Feet): 75 Feet (twice) Assistive device: Rolling walker (2 wheeled) Gait Pattern/deviations: Step-to pattern;Decreased step length - right;Decreased step length - left;Shuffle;Trunk flexed Gait velocity: decr Gait velocity interpretation: Below normal speed for age/gender General Gait Details: cues for sequence, posture and position from [name]            Wheelchair Mobility    Modified Rankin (Stroke Patients Only)       Balance                                            Cognition Arousal/Alertness: Awake/alert Behavior During Therapy:  WFL for tasks assessed/performed Overall Cognitive Status: Within Functional Limits for tasks assessed                                        Exercises      General Comments        Pertinent Vitals/Pain Pain Assessment: 0-10 Pain Score: 4  Pain Location: L knee Pain Descriptors / Indicators: Aching;Sore Pain Intervention(s): Limited activity within patient's tolerance;Monitored during session;Premedicated before session;Ice applied    Home Living                      Prior Function            PT Goals (current goals can now be found in the care plan section) Acute Rehab PT Goals Patient Stated Goal: Regain IND  PT Goal Formulation: With patient Time For Goal Achievement: 03/05/17 Potential to Achieve Goals: Good Progress towards PT goals: Progressing toward goals    Frequency    7X/week      PT Plan Current plan remains appropriate    Co-evaluation              AM-PAC PT "6 Clicks" Daily Activity  Outcome Measure  Difficulty turning over in bed (including adjusting bedclothes, sheets and blankets)?: A Lot Difficulty moving from lying on  back to sitting on the side of the bed? : A Lot Difficulty sitting down on and standing up from a chair with arms (e.g., wheelchair, bedside commode, etc,.)?: A Little Help needed moving to and from a bed to chair (including a wheelchair)?: A Little Help needed walking in hospital room?: A Little Help needed climbing 3-5 steps with a railing? : A Lot 6 Click Score: 15    End of Session Equipment Utilized During Treatment: Gait belt;Left knee immobilizer Activity Tolerance: Patient tolerated treatment well Patient left: in bed;with call bell/phone within reach Nurse Communication: Mobility status PT Visit Diagnosis: Difficulty in walking, not elsewhere classified (R26.2)     Time: 1417-1440 PT Time Calculation (min) (ACUTE ONLY): 23 min  Charges:  $Gait Training: 23-37 mins                     G Codes:       MY [phone]    [name] 03/01/2017, 4:12 PM

## 2017-03-01 NOTE — Progress Notes (Signed)
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 She will benefit from skilled OT to maximize independence and to facilitate a safe discharge.    Follow Up Recommendations  Supervision/Assistance - 24 hour;SNF (The person who was going to assist her at home has health problems and is unable to assist her at this time; patient prefers SNF)   Equipment Recommendations  None recommended by OT    Recommendations for Other Services       Precautions / Restrictions Precautions Precautions: Fall;Knee Required Braces or Orthoses: Knee Immobilizer - Left Knee Immobilizer - Left: Discontinue once straight leg raise with < 10 degree lag Restrictions Weight Bearing Restrictions: No Other Position/Activity Restrictions: WBAT      Mobility Bed Mobility              Transfers Overall transfer level: Needs assistance Equipment used: Rolling walker (2 wheeled) Transfers: Sit to/from Stand Sit to Stand: Min assist;From elevated surface         General transfer comment: cues for LE management and use of UEs to self assist    Balance                                           ADL either performed or assessed with clinical judgement   ADL Overall ADL's : Needs assistance/impaired Eating/Feeding: Independent   Grooming: Wash/dry hands;Min guard                   Toilet Transfer: Min guard;Ambulation;RW;Grab bars   Toileting- Clothing Manipulation and Hygiene: Moderate assistance       Functional mobility during ADLs: Min guard;Rolling walker General ADL Comments: Patient reports that the person who was going to help her at discharge  has health problems. She now feels like she needs SNF Rehab.     Vision         Perception     Praxis      Pertinent Vitals/Pain Pain Assessment: 0-10 Pain Score: 5  Pain Location: L knee Pain Descriptors / Indicators: Aching;Sore Pain Intervention(s): Limited activity within patient's tolerance;Monitored during session;Premedicated before session;Ice applied     Hand Dominance Right   Extremity/Trunk Assessment Upper Extremity Assessment Upper Extremity Assessment: Overall WFL for tasks assessed   Lower Extremity Assessment Lower Extremity Assessment: Defer to PT evaluation   Cervical / Trunk Assessment Cervical / Trunk Assessment: Normal   Communication Communication Communication: No difficulties   Cognition Arousal/Alertness: Awake/alert Behavior During Therapy: WFL for tasks assessed/performed Overall Cognitive Status: Within Functional Limits for tasks assessed                                     General Comments       Exercises    Shoulder Instructions      Home Living Family/patient expects to be discharged to:: Unsure Living Arrangements: Non-relatives/Friends Available Help at Discharge: Family;Friend(s) Type of Home: House Home Access: Stairs to enter [employer] of Steps: 1  Bathroom Shower/Tub: Corporate investment banker: Handicapped height     Home Equipment: Environmental consultant - 2 wheels;Walker - 4 wheels;Cane - quad;Shower seat;Grab bars - tub/shower;Adaptive equipment Adaptive Equipment: Reacher;Sock aid        Prior Functioning/Environment Level of Independence: Independent with assistive device(s)        Comments: using RW prior to surgery        OT Problem List: Decreased range of motion;Pain      OT Treatment/Interventions: Self-care/ADL training;DME and/or AE instruction;Therapeutic activities;Patient/family education    OT Goals(Current goals can be found in the care plan  section) Acute Rehab OT Goals Patient Stated Goal: Regain IND  OT Goal Formulation: With patient Time For Goal Achievement: 03/08/17 Potential to Achieve Goals: Good  OT Frequency: Min 2X/week   Barriers to D/C: Decreased caregiver support          Co-evaluation              AM-PAC PT "6 Clicks" Daily Activity     Outcome Measure Help from another person eating meals?: None Help from another person taking care of personal grooming?: None Help from another person toileting, which includes using toliet, bedpan, or urinal?: A Lot Help from another person bathing (including washing, rinsing, drying)?: A Little Help from another person to put on and taking off regular upper body clothing?: A Little Help from another person to put on and taking off regular lower body clothing?: A Lot 6 Click Score: 18   End of Session    Activity Tolerance:   Patient left:    OT Visit Diagnosis: Muscle weakness (generalized) (M62.81)                Time: 7494-4967 OT Time Calculation (min): 20 min Charges:  OT General Charges $OT Visit: 1 Procedure OT Evaluation $OT Eval Low Complexity: 1 Procedure G-Codes:       Jayden Kratochvil A Jailin Moomaw March 12, 2017, 1:32 PM

## 2017-03-01 NOTE — Progress Notes (Signed)
Subjective: 1 Day Post-Op Procedure(s) (LRB): LEFT TOTAL KNEE ARTHROPLASTY (Left) Patient reports pain as moderate.  Acute blood loss anemia from surgery, but tolerating well.  Objective: Vital signs in last 24 hours: Temp:  [97.7 F (36.5 C)-98.4 F (36.9 C)] 98.1 F (36.7 C) (06/30 0930) Pulse Rate:  [95-113] 100 (06/30 0930) Resp:  [16-18] 18 (06/30 0930) BP: (104-171)/(54-78) 132/54 (06/30 0930) SpO2:  [88 %-100 %] 97 % (06/30 0930)  Intake/Output from previous day: 06/29 0701 - 06/30 0700 In: 4680 [P.O.:1680; I.V.:2785; IV Piggyback:215] Out: 7741 [Urine:1750; Blood:100] Intake/Output this shift: Total I/O In: 360 [P.O.:360] Out: 100 [Urine:100]   Recent Labs  03/01/17 0454  HGB 10.4*    Recent Labs  03/01/17 0454  WBC 11.4*  RBC 4.16  HCT 33.3*  PLT 184    Recent Labs  03/01/17 0454  NA 137  K 4.3  CL 104  CO2 27  BUN 19  CREATININE 0.89  GLUCOSE 144*  CALCIUM 7.9*   No results for input(s): LABPT, INR in the last 72 hours.  Sensation intact distally Intact pulses distally Dorsiflexion/Plantar flexion intact Incision: no drainage No cellulitis present Compartment soft  Assessment/Plan: 1 Day Post-Op Procedure(s) (LRB): LEFT TOTAL KNEE ARTHROPLASTY (Left) Up with therapy Discharge to [facility] Monday.  Mcarthur Rossetti 03/01/2017, 1:38 PM

## 2017-03-01 NOTE — Clinical Social Work Note (Signed)
CSW consulted for "Skilled nursing facility placement." P/T recommending home health PT and pt will have assistance from family/friends. RNCM consulted for home health discharge needs. CSW signing off as no further Social Work needs identified.   Dawn Davidson, [place], [place] Social Work Letitia Libra coverage) [phone]

## 2017-03-01 NOTE — Progress Notes (Signed)
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   Follow Up Recommendations  Home health PT     Equipment Recommendations  None recommended by PT    Recommendations for Other Services OT consult     Precautions / Restrictions Precautions Precautions: Fall;Knee Required Braces or Orthoses: Knee Immobilizer - Left Knee Immobilizer - Left: Discontinue once straight leg raise with < 10 degree lag Restrictions Weight Bearing Restrictions: No Other Position/Activity Restrictions: WBAT    Mobility  Bed Mobility Overal bed mobility: Needs Assistance Bed Mobility: Supine to Sit     Supine to sit: Min assist     General bed mobility comments: Increased time and cues for sequence and use of rail  Transfers Overall transfer level: Needs assistance Equipment used: Rolling walker (2 wheeled) Transfers: Sit to/from Stand Sit to Stand: Min assist;From elevated surface         General transfer comment: cues for LE management and use of UEs to self assist  Ambulation/Gait Ambulation/Gait assistance: Min assist Ambulation Distance (Feet): 85 Feet Assistive device: Rolling walker (2 wheeled) Gait Pattern/deviations: Step-to pattern;Decreased step length - right;Decreased step length - left;Shuffle;Trunk flexed Gait velocity: decr Gait velocity interpretation: Below normal speed for age/gender General Gait Details: cues for sequence, posture and position from [name]            Wheelchair Mobility    Modified Rankin (Stroke Patients Only)       Balance                                            Cognition Arousal/Alertness: Awake/alert Behavior During Therapy: WFL for tasks  assessed/performed Overall Cognitive Status: Within Functional Limits for tasks assessed                                        Exercises Total Joint Exercises Ankle Circles/Pumps: AROM;Both;15 reps;Supine Quad Sets: AROM;Both;10 reps;Supine Heel Slides: AAROM;Left;Supine;15 reps Straight Leg Raises: AAROM;Left;10 reps;Supine Goniometric ROM: AAROM L knee -10 - 45    General Comments        Pertinent Vitals/Pain Pain Assessment: 0-10 Pain Score: 5  Pain Location: L knee Pain Descriptors / Indicators: Aching;Sore Pain Intervention(s): Limited activity within patient's tolerance;Monitored during session;Premedicated before session;Ice applied    Home Living                      Prior Function            PT Goals (current goals can now be found in the care plan section) Acute Rehab PT Goals Patient Stated Goal: Regain IND  PT Goal Formulation: With patient Time For Goal Achievement: 03/05/17 Potential to Achieve Goals: Good Progress towards PT goals: Progressing toward goals    Frequency    7X/week      PT Plan Current plan remains appropriate    Co-evaluation              AM-PAC PT "6 Clicks" Daily Activity  Outcome Measure  Difficulty turning over in bed (including adjusting bedclothes, sheets and blankets)?: A Lot Difficulty moving from lying on back to sitting on the side of the bed? : A Lot Difficulty sitting down on and standing up from a chair with arms (e.g., wheelchair, bedside commode, etc,.)?: A Little Help needed moving to and from a bed to chair (including a wheelchair)?: A Little Help needed walking in hospital room?: A Lot   6 Click Score: 12    End of Session Equipment Utilized During Treatment: Gait belt;Left knee immobilizer Activity Tolerance: Patient tolerated treatment well Patient left: in chair;with chair alarm set;with call bell/phone within reach Nurse Communication: Mobility status PT Visit  Diagnosis: Difficulty in walking, not elsewhere classified (R26.2)     Time: 1007-1219 PT Time Calculation (min) (ACUTE ONLY): 35 min  Charges:  $Gait Training: 8-22 mins $Therapeutic Exercise: 8-22 mins                    G Codes:       Pg [phone]    Gennie Eisinger 03/01/2017, 12:13 PM

## 2017-03-02 NOTE — Progress Notes (Addendum)
CSW informed that patient 24 hour help has fallen through and might need SNF placement.  CSW met with patient and she confirmed that the person who lives with her has not been feeling well and will not be able to assist as much as anticipated.  Patient reports that she still plans to go home despite less support available and she feels confident she will be capable of managing at home- not interested in SNF placement at this time  CSW signing off- please reconsult if needed  Jorge Ny MSW, LCSW [facility] #: [phone]

## 2017-03-02 NOTE — Progress Notes (Signed)
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 Will need another session of OT tomorrow prior to discharge. Discharge recommendations updated.   Follow Up Recommendations  Supervision/Assistance - 24 hour;No OT follow up    Equipment Recommendations  None recommended by OT    Recommendations for Other Services      Precautions / Restrictions Precautions Precautions: Fall;Knee Required Braces or Orthoses: Knee Immobilizer - Left Knee Immobilizer - Left: Discontinue once straight leg raise with < 10 degree lag Restrictions Weight Bearing Restrictions: No Other Position/Activity Restrictions: WBAT       Mobility Bed Mobility                 Transfers                 Balance                                           ADL either performed or assessed with clinical judgement   ADL Overall ADL's : Needs assistance/impaired                                       General ADL Comments: Patient up in chair upon arrival. Had recently worked with PT and gotten pain medication. Began reviewing LB self-care techniques. Patient falling asleep while talking and during instruction. She did say that she has gotten some other assistance and prefers to d/c home now. Will update d/c recs. Needs another session of OT to complete education.     Vision       Perception     Praxis      Cognition Arousal/Alertness: Awake/alert Behavior During Therapy: WFL for tasks assessed/performed Overall Cognitive Status: Within Functional Limits for tasks assessed                                          Exercises     Shoulder Instructions       General Comments      Pertinent Vitals/ Pain       Pain Assessment: 0-10 Pain Score: 3  Pain Location: L knee Pain Descriptors / Indicators: Aching;Sore Pain Intervention(s): Monitored during session;Premedicated before session  Home Living                                          Prior Functioning/Environment              Frequency  Min 2X/week        Progress Toward Goals  OT Goals(current goals can now be found in the care plan section)  Progress towards OT goals: Progressing toward goals  Acute Rehab OT Goals Patient Stated Goal: Regain IND   Plan Discharge plan needs to be updated    Co-evaluation  AM-PAC PT "6 Clicks" Daily Activity     Outcome Measure   Help from another person eating meals?: None Help from another person taking care of personal grooming?: None Help from another person toileting, which includes using toliet, bedpan, or urinal?: A Lot Help from another person bathing (including washing, rinsing, drying)?: A Little Help from another person to put on and taking off regular upper body clothing?: A Little Help from another person to put on and taking off regular lower body clothing?: A Lot 6 Click Score: 18    End of Session    OT Visit Diagnosis: Muscle weakness (generalized) (M62.81)   Activity Tolerance Patient limited by lethargy   Patient Left in chair;with call bell/phone within reach   Nurse Communication Other (comment) (pt falling asleep during OT session)        Time: 0301-3143 OT Time Calculation (min): 10 min  Charges: OT General Charges $OT Visit: 1 Procedure OT Treatments $Self Care/Home Management : 8-22 mins     Yaret Hush A Claudina Oliphant 03/02/2017, 11:44 AM

## 2017-03-02 NOTE — Progress Notes (Signed)
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  Pt hopeful for dc home tomorrow.   Follow Up Recommendations  Home health PT     Equipment Recommendations  None recommended by PT    Recommendations for Other Services OT consult     Precautions / Restrictions Precautions Precautions: Fall;Knee Required Braces or Orthoses: Knee Immobilizer - Left Knee Immobilizer - Left: Discontinue once straight leg raise with < 10 degree lag Restrictions Weight Bearing Restrictions: No Other Position/Activity Restrictions: WBAT    Mobility  Bed Mobility Overal bed mobility: Needs Assistance Bed Mobility: Sit to Supine       Sit to supine: Min guard   General bed mobility comments: cues for sequence.  Transfers Overall transfer level: Needs assistance Equipment used: Rolling walker (2 wheeled) Transfers: Sit to/from Stand Sit to Stand: Min guard         General transfer comment: cues for LE management and use of UEs to self assist  Ambulation/Gait Ambulation/Gait assistance: Min guard Ambulation Distance (Feet): 75 Feet (twice) Assistive device: Rolling walker (2 wheeled) Gait Pattern/deviations: Step-to pattern;Decreased step length - right;Decreased step length - left;Shuffle;Trunk flexed Gait velocity: decr Gait velocity interpretation: Below normal speed for age/gender General Gait Details: min cues for posture and position from RW.  Increased time with several standing rest breaks   Stairs            Wheelchair Mobility    Modified Rankin (Stroke Patients Only)       Balance Overall balance assessment: No apparent balance deficits (not formally assessed)                                           Cognition Arousal/Alertness: Awake/alert Behavior During Therapy: WFL for tasks assessed/performed Overall Cognitive Status: Within Functional Limits for tasks assessed                                        Exercises      General Comments        Pertinent Vitals/Pain Pain Assessment: 0-10 Pain Score: 5  Pain Location: L knee Pain Descriptors / Indicators: Aching;Sore Pain Intervention(s): Limited activity within patient's tolerance;Monitored during session;Patient requesting pain meds-RN notified;Ice applied    Home Living                      Prior Function            PT Goals (current goals can now be found in the care plan section) Acute Rehab PT Goals Patient Stated Goal: Regain IND  PT Goal Formulation: With patient Time For Goal Achievement: 03/05/17 Potential to Achieve Goals: Good Progress towards PT goals: Progressing toward goals    Frequency    7X/week      PT Plan Current plan remains appropriate    Co-evaluation              AM-PAC PT "6 Clicks" Daily Activity  Outcome Measure  Difficulty turning over in bed (  including adjusting bedclothes, sheets and blankets)?: A Lot Difficulty moving from lying on back to sitting on the side of the bed? : A Lot Difficulty sitting down on and standing up from a chair with arms (e.g., wheelchair, bedside commode, etc,.)?: A Lot Help needed moving to and from a bed to chair (including a wheelchair)?: A Little Help needed walking in hospital room?: A Little Help needed climbing 3-5 steps with a railing? : A Little 6 Click Score: 15    End of Session Equipment Utilized During Treatment: Gait belt;Left knee immobilizer Activity Tolerance: Patient tolerated treatment well;Patient limited by fatigue Patient left: with call bell/phone within reach;in bed Nurse Communication: Mobility status;Patient requests pain meds PT Visit Diagnosis: Difficulty in walking,  not elsewhere classified (R26.2)     Time: 1410-1433 PT Time Calculation (min) (ACUTE ONLY): 23 min  Charges:  $Gait Training: 23-37 mins                    G Codes:       Pg [phone]    Kym Scannell 03/02/2017, 3:01 PM

## 2017-03-02 NOTE — Progress Notes (Signed)
Physical Therapy Treatment Patient Details Name: Dawn Davidson MRN: [id number] DOB: Oct 10, 1950 Today's Date: 03/02/2017    History of Present Illness Pt s/p L TKR and with hx of MI, CAD, chronic LBP, R THR (3/18), R TKR and lumbar fusion    PT Comments    Pt continues cooperative and progressing with mobility but ltd this am by increased pain.   Follow Up Recommendations  Home health PT     Equipment Recommendations  None recommended by PT    Recommendations for Other Services OT consult     Precautions / Restrictions Precautions Precautions: Fall;Knee Required Braces or Orthoses: Knee Immobilizer - Left Knee Immobilizer - Left: Discontinue once straight leg raise with < 10 degree lag Restrictions Weight Bearing Restrictions: No Other Position/Activity Restrictions: WBAT    Mobility  Bed Mobility               General bed mobility comments: NT - OOB with nursing and requests back to chair  Transfers Overall transfer level: Needs assistance Equipment used: Rolling walker (2 wheeled) Transfers: Sit to/from Stand Sit to Stand: Min guard         General transfer comment: cues for LE management and use of UEs to self assist  Ambulation/Gait Ambulation/Gait assistance: Min assist;Min guard Ambulation Distance (Feet): 75 Feet Assistive device: Rolling walker (2 wheeled) Gait Pattern/deviations: Step-to pattern;Decreased step length - right;Decreased step length - left;Shuffle;Trunk flexed Gait velocity: decr Gait velocity interpretation: Below normal speed for age/gender General Gait Details: min cues for sequence, posture and position from [name]            Wheelchair Mobility    Modified Rankin (Stroke Patients Only)       Balance                                            Cognition Arousal/Alertness: Awake/alert Behavior During Therapy: WFL for tasks assessed/performed Overall Cognitive Status: Within Functional  Limits for tasks assessed                                        Exercises Total Joint Exercises Ankle Circles/Pumps: AROM;Both;15 reps;Supine Quad Sets: AROM;Both;Supine;15 reps Heel Slides: AAROM;Left;Supine;15 reps Straight Leg Raises: AAROM;Left;Supine;15 reps Goniometric ROM: AAROM L knee -10 - 40    General Comments        Pertinent Vitals/Pain Pain Assessment: 0-10 Pain Score: 5  Pain Location: L knee Pain Descriptors / Indicators: Aching;Sore Pain Intervention(s): Limited activity within patient's tolerance;Monitored during session;Premedicated before session;Ice applied    Home Living                      Prior Function            PT Goals (current goals can now be found in the care plan section) Acute Rehab PT Goals Patient Stated Goal: Regain IND  PT Goal Formulation: With patient Time For Goal Achievement: 03/05/17 Potential to Achieve Goals: Good Progress towards PT goals: Progressing toward goals    Frequency    7X/week      PT Plan Current plan remains appropriate    Co-evaluation              AM-PAC PT "6 Clicks" Daily Activity  Outcome  Measure  Difficulty turning over in bed (including adjusting bedclothes, sheets and blankets)?: A Lot Difficulty moving from lying on back to sitting on the side of the bed? : A Lot Difficulty sitting down on and standing up from a chair with arms (e.g., wheelchair, bedside commode, etc,.)?: A Little Help needed moving to and from a bed to chair (including a wheelchair)?: A Little Help needed walking in hospital room?: A Little Help needed climbing 3-5 steps with a railing? : A Lot 6 Click Score: 15    End of Session Equipment Utilized During Treatment: Gait belt;Left knee immobilizer Activity Tolerance: Patient tolerated treatment well Patient left: with call bell/phone within reach;in chair Nurse Communication: Mobility status PT Visit Diagnosis: Difficulty in walking,  not elsewhere classified (R26.2)     Time: 0375-4360 PT Time Calculation (min) (ACUTE ONLY): 27 min  Charges:  $Gait Training: 8-22 mins $Therapeutic Exercise: 8-22 mins                    G Codes:       Pg [phone]    Dawn Davidson 03/02/2017, 10:37 AM

## 2017-03-02 NOTE — Progress Notes (Signed)
Subjective: 2 Days Post-Op Procedure(s) (LRB): LEFT TOTAL KNEE ARTHROPLASTY (Left) Patient reports pain as mild and improved.  Acute blood loss anemia from surgery, but tolerating well.  Objective: Vital signs in last 24 hours: Temp:  [97.9 F (36.6 C)-99.1 F (37.3 C)] 99.1 F (37.3 C) (07/01 0623) Pulse Rate:  [96-101] 99 (07/01 0623) Resp:  [16-18] 17 (07/01 0623) BP: (132-163)/(48-65) 151/65 (07/01 0623) SpO2:  [86 %-99 %] 95 % (07/01 0623)  Intake/Output from previous day: 06/30 0701 - 07/01 0700 In: 1560 [P.O.:1560] Out: 800 [Urine:800] Intake/Output this shift: Total I/O In: -  Out: 500 [Urine:500]   Recent Labs  03/01/17 0454  HGB 10.4*    Recent Labs  03/01/17 0454  WBC 11.4*  RBC 4.16  HCT 33.3*  PLT 184    Recent Labs  03/01/17 0454  NA 137  K 4.3  CL 104  CO2 27  BUN 19  CREATININE 0.89  GLUCOSE 144*  CALCIUM 7.9*   No results for input(s): LABPT, INR in the last 72 hours.  Sensation intact distally Intact pulses distally Dorsiflexion/Plantar flexion intact Incision: no drainage No cellulitis present Compartment soft  Assessment/Plan: 2 Days Post-Op Procedure(s) (LRB): LEFT TOTAL KNEE ARTHROPLASTY (Left) Up with PT Dc home Monday with HHPT  Eduard Roux 03/02/2017, 8:37 AM

## 2017-03-03 ENCOUNTER — Encounter (HOSPITAL_COMMUNITY): Payer: Self-pay | Admitting: Orthopaedic Surgery

## 2017-03-03 MED ORDER — OXYCODONE-ACETAMINOPHEN 5-325 MG PO TABS: 1.0000 | ORAL_TABLET | ORAL | 0 refills | Status: DC | PRN

## 2017-03-03 MED ORDER — ASPIRIN 325 MG PO TBEC: 325.0000 mg | DELAYED_RELEASE_TABLET | Freq: Two times a day (BID) | ORAL | 0 refills | Status: DC

## 2017-03-03 MED ORDER — METHOCARBAMOL 500 MG PO TABS: 500.0000 mg | ORAL_TABLET | Freq: Four times a day (QID) | ORAL | 0 refills | Status: DC | PRN

## 2017-03-03 NOTE — Progress Notes (Signed)
Physical Therapy Treatment Patient Details Name: Dawn Davidson MRN: [id number] DOB: December 24, 1950 Today's Date: 03/03/2017    History of Present Illness Pt s/p L TKR and with hx of MI, CAD, chronic LBP, R THR (3/18), R TKR and lumbar fusion    PT Comments    POD # 3 Assisted OOB to bathroom, amb in hallway then returned to room to perform TKR TE's following HEP handout.  Instructed on proper tech, frq as well as use of ICE.  Pt moves slow and required increased time.     Follow Up Recommendations  Home health PT     Equipment Recommendations  None recommended by PT    Recommendations for Other Services       Precautions / Restrictions Precautions Precautions: Fall;Knee Precaution Comments: did not use KI today Restrictions Weight Bearing Restrictions: No Other Position/Activity Restrictions: WBAT    Mobility  Bed Mobility Overal bed mobility: Needs Assistance       Supine to sit: Min guard;Min assist     General bed mobility comments: increased time and assist to support L LE  Transfers Overall transfer level: Needs assistance   Transfers: Sit to/from Stand Sit to Stand: Min guard;Supervision         General transfer comment: 25% cues for LE management and use of UEs to self assist plus increased time to rise and lower  Ambulation/Gait Ambulation/Gait assistance: Supervision Ambulation Distance (Feet): 88 Feet Assistive device: Rolling walker (2 wheeled) Gait Pattern/deviations: Step-to pattern;Decreased step length - right;Decreased step length - left;Shuffle;Trunk flexed Gait velocity: decr   General Gait Details: min cues for posture and position from RW.  Increased time with several standing rest breaks   Stairs            Wheelchair Mobility    Modified Rankin (Stroke Patients Only)       Balance                                            Cognition Arousal/Alertness: Awake/alert Behavior During Therapy: WFL for  tasks assessed/performed Overall Cognitive Status: Within Functional Limits for tasks assessed                                        Exercises   Total Knee Replacement TE's 10 reps B LE ankle pumps 10 reps towel squeezes 10 reps knee presses 10 reps heel slides  10 reps SAQ's 10 reps SLR's 10 reps ABD Followed by ICE     General Comments        Pertinent Vitals/Pain Pain Assessment: 0-10 Pain Score: 3  Pain Location: L knee    Home Living                      Prior Function            PT Goals (current goals can now be found in the care plan section) Progress towards PT goals: Progressing toward goals    Frequency    7X/week      PT Plan Current plan remains appropriate    Co-evaluation              AM-PAC PT "6 Clicks" Daily Activity  Outcome Measure  Difficulty turning over in bed (including adjusting bedclothes,  sheets and blankets)?: Total Difficulty moving from lying on back to sitting on the side of the bed? : Total Difficulty sitting down on and standing up from a chair with arms (e.g., wheelchair, bedside commode, etc,.)?: Total Help needed moving to and from a bed to chair (including a wheelchair)?: A Lot Help needed walking in hospital room?: A Lot Help needed climbing 3-5 steps with a railing? : Total 6 Click Score: 8    End of Session Equipment Utilized During Treatment: Gait belt Activity Tolerance: Patient tolerated treatment well Patient left: in chair;with call bell/phone within reach Nurse Communication:  (pt ready for D/C to home after one session) PT Visit Diagnosis: Difficulty in walking, not elsewhere classified (R26.2)     Time: 1000-1040 PT Time Calculation (min) (ACUTE ONLY): 40 min  Charges:  $Gait Training: 8-22 mins $Therapeutic Exercise: 8-22 mins $Therapeutic Activity: 8-22 mins                    G Codes:       {Dresean Beckel  PTA [facility]  Acute  Rehab Pager      [phone]

## 2017-03-03 NOTE — Progress Notes (Signed)
Patient ID: Dawn Davidson, female   DOB: [birth year], 66 y.o.   MRN: [id number] Has made great progress with therapy.  She would rather go home and she has been cleared by therapy.  Her left knee is stable and vitals are stable.

## 2017-03-03 NOTE — Discharge Summary (Signed)
Patient ID: Dawn Davidson MRN: [id number] DOB/AGE: 09/29/1950 66 y.o.  Admit date: 02/28/2017 Discharge date: 03/03/2017  Admission Diagnoses:  Principal Problem:   Unilateral primary osteoarthritis, left knee Active Problems:   Status post total knee replacement, left   Discharge Diagnoses:  Same  Past Medical History:  Diagnosis Date  . Anxiety   . Arthritis    "neck, back, hands, knees, hips" (08/18/2015)  . 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 Chronic lower back pain   . Coronary artery disease    2010 MI  . Depression   . Family history of adverse reaction to anesthesia    her mother gets sick  . GERD (gastroesophageal reflux disease)   . Hyperlipidemia   . Hypertension   . Hypothyroidism since 02/2015  . Macular degeneration, bilateral   . Migraine    "q couple weeks recently" (08/18/2015)  . Morbid obesity ([place]) 05/10/2015  . Myocardial infarction ([place]) 2012  . Temporary low platelet count ([place]) 2012   checked every 6 mths...kept her in for 1 week with MI, and she's not sure exactly why    Surgeries: Procedure(s): LEFT TOTAL KNEE ARTHROPLASTY on 02/28/2017   Consultants:   Discharged Condition: Improved  Hospital Course: Dawn Davidson is an 66 y.o. female who was admitted 02/28/2017 for operative treatment ofUnilateral primary osteoarthritis, left knee. Patient has severe unremitting pain that affects sleep, daily activities, and work/hobbies. After pre-op clearance the patient was taken to the operating room on 02/28/2017 and underwent  Procedure(s): LEFT TOTAL KNEE ARTHROPLASTY.    Patient was given perioperative antibiotics: Anti-infectives    Start     Dose/Rate Route Frequency Ordered Stop   02/28/17 1400  ceFAZolin (ANCEF) IVPB 2g/100 mL premix     2 g 200 mL/hr over 30 Minutes Intravenous Every 6 hours 02/28/17 1059 02/28/17 2112   02/28/17 0600  ceFAZolin (ANCEF) 3 g in dextrose 5 % 50 mL IVPB     3 g 130 mL/hr over 30 Minutes  Intravenous On call to O.R. 02/27/17 1239 02/28/17 0747       Patient was given sequential compression devices, early ambulation, and chemoprophylaxis to prevent DVT.  Patient benefited maximally from hospital stay and there were no complications.    Recent vital signs: Patient Vitals for the past 24 hrs:  BP Temp Temp src Pulse Resp SpO2  03/03/17 0533 (!) 140/54 98.7 F (37.1 C) Oral 95 16 96 %  03/02/17 2100 (!) 150/64 99.1 F (37.3 C) Oral 95 16 100 %  03/02/17 0940 - - - - - 97 %  03/02/17 0939 - - - - - (!) 83 %     Recent laboratory studies:  Recent Labs  03/01/17 0454  WBC 11.4*  HGB 10.4*  HCT 33.3*  PLT 184  NA 137  K 4.3  CL 104  CO2 27  BUN 19  CREATININE 0.89  GLUCOSE 144*  CALCIUM 7.9*     Discharge Medications:   Allergies as of 03/03/2017      Reactions   Sulfa Antibiotics Shortness Of Breath      Medication List    STOP taking these medications   meloxicam 15 MG tablet Commonly known as:  MOBIC     TAKE these medications   acetaminophen-codeine 300-30 MG tablet Commonly known as:  TYLENOL #3 TAKE 1 TO 2 TABLETS BY MOUTH EVERY 8 HOURS AS NEEDED FOR MODERATE PAIN   aspirin 325 MG EC tablet Take 1 tablet (  325 mg total) by mouth 2 (two) times daily after a meal. What changed:  medication strength  how much to take  when to take this   atorvastatin 40 MG tablet Commonly known as:  LIPITOR Take 40 mg by mouth daily.   cholecalciferol 1000 units tablet Commonly known as:  VITAMIN D Take 1,000 Units by mouth daily.   DULoxetine 60 MG capsule Commonly known as:  CYMBALTA Take 60 mg by mouth 2 (two) times daily.   levothyroxine 50 MCG tablet Commonly known as:  SYNTHROID, LEVOTHROID Take 50 mcg by mouth daily before breakfast.   methocarbamol 500 MG tablet Commonly known as:  ROBAXIN Take 1 tablet (500 mg total) by mouth every 6 (six) hours as needed for muscle spasms. What changed:  See the new instructions.   metoprolol  tartrate 25 MG tablet Commonly known as:  LOPRESSOR Take 25 mg by mouth 2 (two) times daily.   nitroGLYCERIN 0.4 MG SL tablet Commonly known as:  NITROSTAT Place 0.4 mg under the tongue every 5 (five) minutes as needed for chest pain. Reported on 10/09/2015   omeprazole 20 MG capsule Commonly known as:  PRILOSEC Take 20 mg by mouth daily before breakfast.   oxyCODONE-acetaminophen 5-325 MG tablet Commonly known as:  ROXICET Take 1-2 tablets by mouth every 4 (four) hours as needed.   topiramate 50 MG tablet Commonly known as:  TOPAMAX Take 50 mg by mouth at bedtime.   traZODone 100 MG tablet Commonly known as:  DESYREL Take 100 mg by mouth at bedtime.       Diagnostic Studies: Dg Knee Left Port  Result Date: 02/28/2017 CLINICAL DATA:  Status post left total knee replacement. EXAM: PORTABLE LEFT KNEE - 1-2 VIEW COMPARISON:  None. FINDINGS: Left total knee prosthesis in satisfactory position and alignment. No fracture or dislocation seen. IMPRESSION: Satisfactory postoperative appearance of a left total knee prosthesis. Electronically Signed   By: Claudie Revering M.D.   On: 02/28/2017 14:39    Disposition: 06-Home-Health Care Svc  Discharge Instructions    Discharge patient    Complete by:  As directed    Discharge disposition:  01-Home or Self Care   Discharge patient date:  03/03/2017      Follow-up Information    [facility] Follow up.   Specialty:  [place] Why:  Home Health Physical Therapy Contact information: [place] [address] [phone]        Mcarthur Rossetti, MD Follow up in 2 week(s).   Specialty:  Orthopedic Surgery Contact information: [place] [address] [phone]            Signed: Mcarthur Rossetti 03/03/2017, 7:24 AM

## 2017-03-04 DIAGNOSIS — I10 Essential (primary) hypertension: Secondary | ICD-10-CM | POA: Diagnosis not present

## 2017-03-04 DIAGNOSIS — F419 Anxiety disorder, unspecified: Secondary | ICD-10-CM | POA: Diagnosis not present

## 2017-03-04 DIAGNOSIS — I2510 Atherosclerotic heart disease of native coronary artery without angina pectoris: Secondary | ICD-10-CM | POA: Diagnosis not present

## 2017-03-04 DIAGNOSIS — Z471 Aftercare following joint replacement surgery: Secondary | ICD-10-CM | POA: Diagnosis not present

## 2017-03-04 DIAGNOSIS — F329 Major depressive disorder, single episode, unspecified: Secondary | ICD-10-CM | POA: Diagnosis not present

## 2017-03-04 DIAGNOSIS — M48062 Spinal stenosis, lumbar region with neurogenic claudication: Secondary | ICD-10-CM | POA: Diagnosis not present

## 2017-03-06 DIAGNOSIS — F329 Major depressive disorder, single episode, unspecified: Secondary | ICD-10-CM | POA: Diagnosis not present

## 2017-03-06 DIAGNOSIS — I2510 Atherosclerotic heart disease of native coronary artery without angina pectoris: Secondary | ICD-10-CM | POA: Diagnosis not present

## 2017-03-06 DIAGNOSIS — Z471 Aftercare following joint replacement surgery: Secondary | ICD-10-CM | POA: Diagnosis not present

## 2017-03-06 DIAGNOSIS — F419 Anxiety disorder, unspecified: Secondary | ICD-10-CM | POA: Diagnosis not present

## 2017-03-06 DIAGNOSIS — I10 Essential (primary) hypertension: Secondary | ICD-10-CM | POA: Diagnosis not present

## 2017-03-06 DIAGNOSIS — M48062 Spinal stenosis, lumbar region with neurogenic claudication: Secondary | ICD-10-CM | POA: Diagnosis not present

## 2017-03-10 DIAGNOSIS — F419 Anxiety disorder, unspecified: Secondary | ICD-10-CM | POA: Diagnosis not present

## 2017-03-10 DIAGNOSIS — M48062 Spinal stenosis, lumbar region with neurogenic claudication: Secondary | ICD-10-CM | POA: Diagnosis not present

## 2017-03-10 DIAGNOSIS — I10 Essential (primary) hypertension: Secondary | ICD-10-CM | POA: Diagnosis not present

## 2017-03-10 DIAGNOSIS — Z471 Aftercare following joint replacement surgery: Secondary | ICD-10-CM | POA: Diagnosis not present

## 2017-03-10 DIAGNOSIS — F329 Major depressive disorder, single episode, unspecified: Secondary | ICD-10-CM | POA: Diagnosis not present

## 2017-03-10 DIAGNOSIS — I2510 Atherosclerotic heart disease of native coronary artery without angina pectoris: Secondary | ICD-10-CM | POA: Diagnosis not present

## 2017-03-12 ENCOUNTER — Ambulatory Visit (INDEPENDENT_AMBULATORY_CARE_PROVIDER_SITE_OTHER): Payer: Medicare HMO | Admitting: Orthopaedic Surgery

## 2017-03-12 DIAGNOSIS — Z471 Aftercare following joint replacement surgery: Secondary | ICD-10-CM | POA: Diagnosis not present

## 2017-03-12 DIAGNOSIS — Z96652 Presence of left artificial knee joint: Secondary | ICD-10-CM

## 2017-03-12 DIAGNOSIS — F419 Anxiety disorder, unspecified: Secondary | ICD-10-CM | POA: Diagnosis not present

## 2017-03-12 DIAGNOSIS — I10 Essential (primary) hypertension: Secondary | ICD-10-CM | POA: Diagnosis not present

## 2017-03-12 DIAGNOSIS — I2510 Atherosclerotic heart disease of native coronary artery without angina pectoris: Secondary | ICD-10-CM | POA: Diagnosis not present

## 2017-03-12 DIAGNOSIS — F329 Major depressive disorder, single episode, unspecified: Secondary | ICD-10-CM | POA: Diagnosis not present

## 2017-03-12 DIAGNOSIS — M48062 Spinal stenosis, lumbar region with neurogenic claudication: Secondary | ICD-10-CM | POA: Diagnosis not present

## 2017-03-12 MED ORDER — METHOCARBAMOL 500 MG PO TABS: 500.0000 mg | ORAL_TABLET | Freq: Four times a day (QID) | ORAL | 0 refills | Status: DC | PRN

## 2017-03-12 MED ORDER — OXYCODONE-ACETAMINOPHEN 5-325 MG PO TABS: 1.0000 | ORAL_TABLET | Freq: Four times a day (QID) | ORAL | 0 refills | Status: DC | PRN

## 2017-03-12 MED ORDER — MELOXICAM 15 MG PO TABS: 15.0000 mg | ORAL_TABLET | Freq: Every day | ORAL | 6 refills | Status: DC

## 2017-03-12 NOTE — Progress Notes (Signed)
The patient is now 2 weeks status post a left total knee arthroplasty. She is doing well. She's been transitioning with home health therapy. She knows that she wants to proceed to outpatient therapy.  On examination of her left knee her suture line was gets removed the staples and placed her shows. Her extension is almost full flexion is to 90. Her other knee only flexes to 100. Her knee feels ligamentously stable. Her calf is soft.  At this point we will refill her pain medication to get her to outpatient therapy which we will work on scheduling. We'll also refill her methocarbamol and meloxicam. She will go back to a daily baby aspirin. We'll see her in 4 weeks in follow-up to see how she doing overall but no x-rays are needed.

## 2017-03-13 ENCOUNTER — Other Ambulatory Visit (INDEPENDENT_AMBULATORY_CARE_PROVIDER_SITE_OTHER): Payer: Self-pay

## 2017-03-13 MED ORDER — TIZANIDINE HCL 4 MG PO TABS: 4.0000 mg | ORAL_TABLET | Freq: Two times a day (BID) | ORAL | 1 refills | Status: DC | PRN

## 2017-03-14 ENCOUNTER — Other Ambulatory Visit (INDEPENDENT_AMBULATORY_CARE_PROVIDER_SITE_OTHER): Payer: Self-pay

## 2017-03-14 DIAGNOSIS — I10 Essential (primary) hypertension: Secondary | ICD-10-CM | POA: Diagnosis not present

## 2017-03-14 DIAGNOSIS — Z471 Aftercare following joint replacement surgery: Secondary | ICD-10-CM | POA: Diagnosis not present

## 2017-03-14 DIAGNOSIS — F419 Anxiety disorder, unspecified: Secondary | ICD-10-CM | POA: Diagnosis not present

## 2017-03-14 DIAGNOSIS — I2510 Atherosclerotic heart disease of native coronary artery without angina pectoris: Secondary | ICD-10-CM | POA: Diagnosis not present

## 2017-03-14 DIAGNOSIS — F329 Major depressive disorder, single episode, unspecified: Secondary | ICD-10-CM | POA: Diagnosis not present

## 2017-03-14 DIAGNOSIS — M48062 Spinal stenosis, lumbar region with neurogenic claudication: Secondary | ICD-10-CM | POA: Diagnosis not present

## 2017-03-14 DIAGNOSIS — Z96652 Presence of left artificial knee joint: Secondary | ICD-10-CM

## 2017-03-28 ENCOUNTER — Telehealth (INDEPENDENT_AMBULATORY_CARE_PROVIDER_SITE_OTHER): Payer: Self-pay | Admitting: Orthopaedic Surgery

## 2017-03-28 NOTE — Telephone Encounter (Signed)
Plan of Care faxed 03/28/17 ° °

## 2017-04-03 ENCOUNTER — Ambulatory Visit: Payer: Medicare HMO | Admitting: Physical Therapy

## 2017-04-14 ENCOUNTER — Ambulatory Visit (INDEPENDENT_AMBULATORY_CARE_PROVIDER_SITE_OTHER): Payer: Medicare HMO | Admitting: Orthopaedic Surgery

## 2017-04-14 DIAGNOSIS — Z96652 Presence of left artificial knee joint: Secondary | ICD-10-CM

## 2017-04-14 MED ORDER — MELOXICAM 15 MG PO TABS: 15.0000 mg | ORAL_TABLET | Freq: Every day | ORAL | 6 refills | Status: DC | PRN

## 2017-04-14 NOTE — Progress Notes (Signed)
The patient is 45 days status post a left total knee are plasty and 5 months status post a right total hip arthroplasty. She is doing well. She just needs meloxicam as a pain medicine. She is done all of her rehabilitation on her own without any outpatient physical therapy.  On examination of her knee or extension is full her flexion is to 115. The incisions well-healed. Both her left knee and right hip are doing well. She family with a cane but says she doesn't really needed.  At this point we don't need to see her back for 6 months. At that visit I would like a low AP pelvis as well as an AP and lateral of her left knee.

## 2017-04-15 DIAGNOSIS — E039 Hypothyroidism, unspecified: Secondary | ICD-10-CM | POA: Diagnosis not present

## 2017-04-21 ENCOUNTER — Ambulatory Visit: Payer: Medicare HMO | Attending: Orthopaedic Surgery | Admitting: Physical Therapy

## 2017-04-27 ENCOUNTER — Emergency Department (HOSPITAL_COMMUNITY): Payer: Medicare HMO

## 2017-04-27 ENCOUNTER — Encounter (HOSPITAL_COMMUNITY): Payer: Self-pay | Admitting: Nurse Practitioner

## 2017-04-27 ENCOUNTER — Observation Stay (HOSPITAL_COMMUNITY)
Admission: EM | Admit: 2017-04-27 | Discharge: 2017-04-28 | Disposition: A | Discharge status: Home / Self Care | Payer: Medicare HMO | Attending: Family Medicine | Admitting: Family Medicine

## 2017-04-27 DIAGNOSIS — F32A Depression, unspecified: Secondary | ICD-10-CM | POA: Diagnosis present

## 2017-04-27 DIAGNOSIS — R079 Chest pain, unspecified: Secondary | ICD-10-CM | POA: Diagnosis not present

## 2017-04-27 DIAGNOSIS — R05 Cough: Secondary | ICD-10-CM | POA: Diagnosis not present

## 2017-04-27 DIAGNOSIS — E119 Type 2 diabetes mellitus without complications: Secondary | ICD-10-CM | POA: Diagnosis not present

## 2017-04-27 DIAGNOSIS — I110 Hypertensive heart disease with heart failure: Secondary | ICD-10-CM | POA: Diagnosis not present

## 2017-04-27 DIAGNOSIS — J810 Acute pulmonary edema: Secondary | ICD-10-CM | POA: Diagnosis not present

## 2017-04-27 DIAGNOSIS — I5031 Acute diastolic (congestive) heart failure: Secondary | ICD-10-CM | POA: Insufficient documentation

## 2017-04-27 DIAGNOSIS — F419 Anxiety disorder, unspecified: Secondary | ICD-10-CM | POA: Insufficient documentation

## 2017-04-27 DIAGNOSIS — E039 Hypothyroidism, unspecified: Secondary | ICD-10-CM | POA: Diagnosis present

## 2017-04-27 DIAGNOSIS — G8929 Other chronic pain: Secondary | ICD-10-CM | POA: Insufficient documentation

## 2017-04-27 DIAGNOSIS — Z96652 Presence of left artificial knee joint: Secondary | ICD-10-CM | POA: Diagnosis not present

## 2017-04-27 DIAGNOSIS — I10 Essential (primary) hypertension: Secondary | ICD-10-CM | POA: Diagnosis not present

## 2017-04-27 DIAGNOSIS — Z79899 Other long term (current) drug therapy: Secondary | ICD-10-CM | POA: Diagnosis not present

## 2017-04-27 DIAGNOSIS — E785 Hyperlipidemia, unspecified: Secondary | ICD-10-CM | POA: Diagnosis not present

## 2017-04-27 DIAGNOSIS — R0602 Shortness of breath: Secondary | ICD-10-CM

## 2017-04-27 DIAGNOSIS — Z955 Presence of coronary angioplasty implant and graft: Secondary | ICD-10-CM | POA: Diagnosis not present

## 2017-04-27 DIAGNOSIS — Z7982 Long term (current) use of aspirin: Secondary | ICD-10-CM | POA: Insufficient documentation

## 2017-04-27 DIAGNOSIS — I2510 Atherosclerotic heart disease of native coronary artery without angina pectoris: Secondary | ICD-10-CM | POA: Insufficient documentation

## 2017-04-27 DIAGNOSIS — I252 Old myocardial infarction: Secondary | ICD-10-CM | POA: Diagnosis not present

## 2017-04-27 DIAGNOSIS — F329 Major depressive disorder, single episode, unspecified: Secondary | ICD-10-CM | POA: Insufficient documentation

## 2017-04-27 DIAGNOSIS — M545 Low back pain, unspecified: Secondary | ICD-10-CM | POA: Diagnosis present

## 2017-04-27 DIAGNOSIS — E669 Obesity, unspecified: Secondary | ICD-10-CM

## 2017-04-27 DIAGNOSIS — K219 Gastro-esophageal reflux disease without esophagitis: Secondary | ICD-10-CM | POA: Diagnosis not present

## 2017-04-27 DIAGNOSIS — Z6841 Body Mass Index (BMI) 40.0 and over, adult: Secondary | ICD-10-CM | POA: Diagnosis not present

## 2017-04-27 DIAGNOSIS — E1169 Type 2 diabetes mellitus with other specified complication: Secondary | ICD-10-CM

## 2017-04-27 HISTORY — DX: Essential (primary) hypertension: I10

## 2017-04-27 HISTORY — DX: Acute pulmonary edema: J81.0

## 2017-04-27 LAB — BASIC METABOLIC PANEL
Anion gap: 7 (ref 5–15)
BUN: 13 mg/dL (ref 6–20)
CO2: 26 mmol/L (ref 22–32)
Calcium: 9 mg/dL (ref 8.9–10.3)
Chloride: 103 mmol/L (ref 101–111)
Creatinine, Ser: 0.78 mg/dL (ref 0.44–1.00)
GFR calc Af Amer: >60 mL/min (ref >60)
GFR calc non Af Amer: >60 mL/min (ref >60)
Glucose, Bld: 125 mg/dL — ABNORMAL HIGH (ref 65–99)
Potassium: 3.5 mmol/L (ref 3.5–5.1)
Sodium: 136 mmol/L (ref 135–145)

## 2017-04-27 LAB — TROPONIN I: Troponin I: <0.03 ng/mL (ref <0.03)

## 2017-04-27 LAB — HEMOGLOBIN A1C
Hgb A1c MFr Bld: 6.5 % — ABNORMAL HIGH (ref 4.8–5.6)
Mean Plasma Glucose: 139.85 mg/dL

## 2017-04-27 LAB — CBC
HCT: 33.6 % — ABNORMAL LOW (ref 36.0–46.0)
Hemoglobin: 10.3 g/dL — ABNORMAL LOW (ref 12.0–15.0)
MCH: 23.6 pg — ABNORMAL LOW (ref 26.0–34.0)
MCHC: 30.7 g/dL (ref 30.0–36.0)
MCV: 77.1 fL — ABNORMAL LOW (ref 78.0–100.0)
Platelets: 215 10*3/uL (ref 150–400)
RBC: 4.36 MIL/uL (ref 3.87–5.11)
RDW: 14.9 % (ref 11.5–15.5)
WBC: 8.5 10*3/uL (ref 4.0–10.5)

## 2017-04-27 LAB — I-STAT TROPONIN, ED: Troponin i, poc: 0.01 ng/mL (ref 0.00–0.08)

## 2017-04-27 LAB — BRAIN NATRIURETIC PEPTIDE: B Natriuretic Peptide: 209.2 pg/mL — ABNORMAL HIGH (ref 0.0–100.0)

## 2017-04-27 MED ORDER — SENNOSIDES-DOCUSATE SODIUM 8.6-50 MG PO TABS: 1.0000 | ORAL_TABLET | Freq: Every evening | ORAL | Status: DC | PRN

## 2017-04-27 MED ORDER — ONDANSETRON HCL 4 MG/2ML IJ SOLN: 4.0000 mg | Freq: Four times a day (QID) | INTRAMUSCULAR | Status: DC | PRN

## 2017-04-27 MED ORDER — SODIUM CHLORIDE 0.9% FLUSH: 3.0000 mL | INTRAVENOUS | Status: DC | PRN

## 2017-04-27 MED ORDER — DULOXETINE HCL 60 MG PO CPEP
60.0000 mg | ORAL_CAPSULE | Freq: Two times a day (BID) | ORAL | Status: DC
  Administered 2017-04-27 – 2017-04-28 (×2): 60 mg via ORAL
  Filled 2017-04-27 (×2): qty 1

## 2017-04-27 MED ORDER — HYDRALAZINE HCL 20 MG/ML IJ SOLN: 10.0000 mg | INTRAMUSCULAR | Status: DC | PRN

## 2017-04-27 MED ORDER — TOPIRAMATE 25 MG PO TABS
50.0000 mg | ORAL_TABLET | Freq: Every day | ORAL | Status: DC
  Administered 2017-04-27: 50 mg via ORAL
  Filled 2017-04-27: qty 2

## 2017-04-27 MED ORDER — ONDANSETRON HCL 4 MG PO TABS: 4.0000 mg | ORAL_TABLET | Freq: Four times a day (QID) | ORAL | Status: DC | PRN

## 2017-04-27 MED ORDER — ATORVASTATIN CALCIUM 40 MG PO TABS
40.0000 mg | ORAL_TABLET | Freq: Every day | ORAL | Status: DC
  Administered 2017-04-28: 40 mg via ORAL
  Filled 2017-04-27: qty 1

## 2017-04-27 MED ORDER — ENOXAPARIN SODIUM 40 MG/0.4ML ~~LOC~~ SOLN
40.0000 mg | SUBCUTANEOUS | Status: DC
  Administered 2017-04-27: 40 mg via SUBCUTANEOUS
  Filled 2017-04-27: qty 0.4

## 2017-04-27 MED ORDER — FUROSEMIDE 40 MG PO TABS
40.0000 mg | ORAL_TABLET | Freq: Every day | ORAL | Status: DC
  Administered 2017-04-28: 40 mg via ORAL
  Filled 2017-04-27: qty 1

## 2017-04-27 MED ORDER — VITAMIN D 1000 UNITS PO TABS
1000.0000 [IU] | ORAL_TABLET | Freq: Every day | ORAL | Status: DC
  Administered 2017-04-28: 1000 [IU] via ORAL
  Filled 2017-04-27: qty 1

## 2017-04-27 MED ORDER — FUROSEMIDE 20 MG PO TABS
20.0000 mg | ORAL_TABLET | Freq: Once | ORAL | Status: AC
  Administered 2017-04-27: 20 mg via ORAL
  Filled 2017-04-27: qty 1

## 2017-04-27 MED ORDER — ACETAMINOPHEN 325 MG PO TABS: 650.0000 mg | ORAL_TABLET | Freq: Four times a day (QID) | ORAL | Status: DC | PRN

## 2017-04-27 MED ORDER — METOPROLOL TARTRATE 25 MG PO TABS
25.0000 mg | ORAL_TABLET | Freq: Two times a day (BID) | ORAL | Status: DC
  Administered 2017-04-27 – 2017-04-28 (×2): 25 mg via ORAL
  Filled 2017-04-27 (×2): qty 1

## 2017-04-27 MED ORDER — LEVOTHYROXINE SODIUM 50 MCG PO TABS
50.0000 ug | ORAL_TABLET | Freq: Every day | ORAL | Status: DC
  Administered 2017-04-28: 50 ug via ORAL
  Filled 2017-04-27: qty 1

## 2017-04-27 MED ORDER — ACETAMINOPHEN 650 MG RE SUPP: 650.0000 mg | Freq: Four times a day (QID) | RECTAL | Status: DC | PRN

## 2017-04-27 MED ORDER — FENTANYL CITRATE (PF) 100 MCG/2ML IJ SOLN
50.0000 ug | Freq: Once | INTRAMUSCULAR | Status: AC
  Administered 2017-04-27: 50 ug via INTRAVENOUS
  Filled 2017-04-27: qty 2

## 2017-04-27 MED ORDER — ASPIRIN EC 325 MG PO TBEC
325.0000 mg | DELAYED_RELEASE_TABLET | Freq: Two times a day (BID) | ORAL | Status: DC
  Administered 2017-04-28: 325 mg via ORAL
  Filled 2017-04-27: qty 1

## 2017-04-27 MED ORDER — SODIUM CHLORIDE 0.9 % IV SOLN: 250.0000 mL | INTRAVENOUS | Status: DC | PRN

## 2017-04-27 MED ORDER — TRAZODONE HCL 100 MG PO TABS
100.0000 mg | ORAL_TABLET | Freq: Every day | ORAL | Status: DC
  Administered 2017-04-27: 100 mg via ORAL
  Filled 2017-04-27: qty 1

## 2017-04-27 MED ORDER — SODIUM CHLORIDE 0.9% FLUSH
3.0000 mL | Freq: Two times a day (BID) | INTRAVENOUS | Status: DC
  Administered 2017-04-27 – 2017-04-28 (×2): 3 mL via INTRAVENOUS

## 2017-04-27 MED ORDER — METHOCARBAMOL 500 MG PO TABS
500.0000 mg | ORAL_TABLET | Freq: Four times a day (QID) | ORAL | Status: DC | PRN
  Administered 2017-04-28: 500 mg via ORAL
  Filled 2017-04-27: qty 1

## 2017-04-27 MED ORDER — PANTOPRAZOLE SODIUM 40 MG PO TBEC
40.0000 mg | DELAYED_RELEASE_TABLET | Freq: Every day | ORAL | Status: DC
  Administered 2017-04-28: 40 mg via ORAL
  Filled 2017-04-27: qty 1

## 2017-04-27 MED ORDER — NITROGLYCERIN 0.4 MG SL SUBL: 0.4000 mg | SUBLINGUAL_TABLET | SUBLINGUAL | Status: DC | PRN

## 2017-04-27 NOTE — ED Triage Notes (Signed)
Pt presents with c/o SOB. The sob began yesterday evening. She reports tightness in her chest, upper back, cough. Her symptoms are worse when lying flat. Her symptoms are relived by nitro. she went to [facility] [place] walk in clinic this morning and they referred her to ED for further evaluation.

## 2017-04-27 NOTE — ED Provider Notes (Signed)
[facility] [facility] DEPT Provider Note   CSN: [id number] Arrival date & time: 04/27/17  1118     History   Chief Complaint Chief Complaint  Patient presents with  . Shortness of Breath    HPI Dawn Davidson is a 66 y.o. female who presents with 2 days of progressively worsening shortness of breath. Patient states that she notes that shortness of breath is worse with exertion. She states that when she walks around her house, she notes that she gets more short of breath easily and more fatigued. Patient states that that has not been her normal baseline. Patient also reports that the shortness of breath is worse when laying flat. Patient states that for the last 2 nights she has not been able to sleep flat and has to sit up to sleep, which is abnormal for her.  Patient does report an intermittent cough States it is nonproductive. Patient states that she took Nitroglycerin yesterday with mild improvement of symptoms. She also used an albuterol inhaler with no improvement in symptoms. She reports that when symptoms became worse today, she attempted to use her nitroglycerin again but did not have any improvement. Patient went into the [facility] walk[facility] clinic and was positive to come to the emergency department for further evaluation. Patient does not have any history of asthma or COPD. Patient denies any smoking history. Patient denies any fevers/chills, chest pain, abdominal pain, nausea/vomiting, leg swelling, numbness/weakness in her arms or legs. She denies any OCP use, recent immobilization, prior history of DVT/PE, leg swelling, or long travel. She did have surgery in June 2018 but no other recent surgeries. Patient does have a history of an MI and had stents placed broccoli 5 years ago pregnant young. Patient states that since moving here and she has not followed up with any cardiologist.   The history is provided by the patient.    Past Medical History:  Diagnosis Date  . Anxiety   . Arthritis      "neck, back, hands, knees, hips" (08/18/2015)  . 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 Chronic lower back pain   . Coronary artery disease    2010 MI  . Depression   . Family history of adverse reaction to anesthesia    her mother gets sick  . GERD (gastroesophageal reflux disease)   . Hyperlipidemia   . Hypertension   . Hypothyroidism since 02/2015  . Macular degeneration, bilateral   . Migraine    "q couple weeks recently" (08/18/2015)  . Morbid obesity ([place]) 05/10/2015  . Myocardial infarction ([place]) 2012  . Temporary low platelet count ([place]) 2012   checked every 6 mths...kept her in for 1 week with MI, and she's not sure exactly why    Patient Active Problem List   Diagnosis Date Noted  . Acute pulmonary edema ([place]) 04/27/2017  . Coronary artery disease 04/27/2017  . Chronic lower back pain 04/27/2017  . Hypothyroidism 04/27/2017  . Hypertension 04/27/2017  . GERD (gastroesophageal reflux disease) 04/27/2017  . Depression 04/27/2017  . Hyperlipidemia 04/27/2017  . Status post total knee replacement, left 02/28/2017  . Unilateral primary osteoarthritis, left knee 01/15/2017  . Unilateral primary osteoarthritis, right hip 11/01/2016  . Status post total replacement of right hip 11/01/2016  . 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 Lumbar stenosis with neurogenic claudication 08/18/2015  . Morbid obesity ([place]) 05/10/2015    Past Surgical History:  Procedure Laterality Date  . BACK SURGERY    .  BREAST BIOPSY Left 08/2015  . BREAST LUMPECTOMY WITH RADIOACTIVE SEED LOCALIZATION Left 11/23/2015   Procedure: LEFT BREAST LUMPECTOMY WITH RADIOACTIVE SEED LOCALIZATION;  Surgeon: Fanny Skates, MD;  Location: [facility];  Service: General;  Laterality: Left;  . CARDIAC CATHETERIZATION  2013  . CHOLECYSTECTOMY OPEN    . CORONARY ANGIOPLASTY WITH STENT PLACEMENT  2013   12/09/11 95% stenosis proximal RCA->DES; otherwise no significant CAD  . EYE  SURGERY     cataract right eye   . GASTRIC RESTRICTION SURGERY  1974  . JOINT REPLACEMENT Right    knee  . MAXIMUM ACCESS (MAS)POSTERIOR LUMBAR INTERBODY FUSION (PLIF) 1 LEVEL N/A 08/18/2015   Procedure: Lumbar four-five Maximum access posterior lumbar interbody fusion;  Surgeon: Erline Levine, MD;  Location: [facility];  Service: Neurosurgery;  Laterality: N/A;  L4-5 Maximum access posterior lumbar interbody fusion  . REPLACEMENT TOTAL KNEE Right 2007  . TONSILLECTOMY    . TOTAL HIP ARTHROPLASTY Right 11/01/2016   Procedure: RIGHT TOTAL HIP ARTHROPLASTY ANTERIOR APPROACH;  Surgeon: Mcarthur Rossetti, MD;  Location: [facility];  Service: Orthopedics;  Laterality: Right;  . TOTAL KNEE ARTHROPLASTY Left 02/28/2017   Procedure: LEFT TOTAL KNEE ARTHROPLASTY;  Surgeon: Mcarthur Rossetti, MD;  Location: [facility];  Service: Orthopedics;  Laterality: Left;    OB History    No data available       Home Medications    Prior to Admission medications   Medication Sig Start Date End Date Taking? Authorizing Provider  aspirin EC 325 MG EC tablet Take 1 tablet (325 mg total) by mouth 2 (two) times daily after a meal. 03/03/17  Yes Mcarthur Rossetti, MD  atorvastatin (LIPITOR) 40 MG tablet Take 40 mg by mouth daily.  03/25/15  Yes [provider]  cholecalciferol (VITAMIN D) 1000 UNITS tablet Take 1,000 Units by mouth daily.   Yes [provider]  DULoxetine (CYMBALTA) 60 MG capsule Take 60 mg by mouth 2 (two) times daily.   Yes [provider]  levothyroxine (SYNTHROID, LEVOTHROID) 50 MCG tablet Take 50 mcg by mouth daily before breakfast.  04/14/15  Yes [provider]  meloxicam (MOBIC) 15 MG tablet Take 1 tablet (15 mg total) by mouth daily as needed for pain. 04/14/17  Yes Mcarthur Rossetti, MD  methocarbamol (ROBAXIN) 500 MG tablet Take 1 tablet (500 mg total) by mouth every 6 (six) hours as needed for muscle spasms. 03/12/17  Yes Mcarthur Rossetti, MD  metoprolol tartrate (LOPRESSOR) 25 MG tablet Take 25 mg by mouth 2 (two) times daily.  04/25/15  Yes [provider]  nitroGLYCERIN (NITROSTAT) 0.4 MG SL tablet Place 0.4 mg under the tongue every 5 (five) minutes as needed for chest pain. Reported on 10/09/2015   Yes [provider]  omeprazole (PRILOSEC) 20 MG capsule Take 20 mg by mouth daily before breakfast.  06/02/15  Yes [provider]  topiramate (TOPAMAX) 50 MG tablet Take 50 mg by mouth at bedtime.   Yes [provider]  traZODone (DESYREL) 100 MG tablet Take 100 mg by mouth at bedtime.   Yes [provider]  acetaminophen-codeine (TYLENOL #3) 300-30 MG tablet TAKE 1 TO 2 TABLETS BY MOUTH EVERY 8 HOURS AS NEEDED FOR MODERATE PAIN Patient not taking: Reported on 04/27/2017 12/09/16   Mcarthur Rossetti, MD  oxyCODONE-acetaminophen (ROXICET) 5-325 MG tablet Take 1-2 tablets by mouth every 6 (six) hours as needed. Patient not taking: Reported on 04/27/2017 03/12/17  Mcarthur Rossetti, MD  tiZANidine (ZANAFLEX) 4 MG tablet Take 1 tablet (4 mg total) by mouth 2 (two) times daily as needed for muscle spasms. Patient not taking: Reported on 04/27/2017 03/13/17   Mcarthur Rossetti, MD    Family History Family History  Problem Relation Age of Onset  . Stomach cancer Mother   . Heart disease Sister   . Cancer Brother        X3 BROTHERS  . Bipolar disorder Daughter   . Bipolar disorder Daughter   . Thyroid disease Daughter   . Thyroid disease Grandchild     Social History Social History  Substance Use Topics  . Smoking status: Never Smoker  . Smokeless tobacco: Never Used  . Alcohol use No     Allergies   Sulfa antibiotics   Review of Systems Review of Systems  Constitutional: Positive for fatigue. Negative for chills and fever.  HENT: Negative for congestion.   Respiratory: Positive for cough and shortness of breath.        Orthopnea  Cardiovascular:  Negative for chest pain and leg swelling.  Gastrointestinal: Negative for abdominal pain, diarrhea, nausea and vomiting.  Genitourinary: Negative for dysuria and hematuria.  Musculoskeletal: Negative for back pain and neck pain.  Skin: Negative for rash.  Neurological: Negative for dizziness, weakness, numbness and headaches.     Physical Exam Updated Vital Signs BP (!) 144/67   Pulse 79   Temp 98.4 F (36.9 C) (Oral)   Resp 18   SpO2 96%   Physical Exam  Constitutional: She is oriented to person, place, and time. She appears well-developed and well-nourished.  Appears uncomfortable but no acute distress  HENT:  Head: Normocephalic and atraumatic.  Mouth/Throat: Oropharynx is clear and moist and mucous membranes are normal.  Eyes: Pupils are equal, round, and reactive to light. Conjunctivae, EOM and lids are normal.  Neck: Full passive range of motion without pain. No JVD present.  Cardiovascular: Normal rate, regular rhythm, normal heart sounds and normal pulses.  Exam reveals no gallop and no friction rub.   No murmur heard. Pulmonary/Chest: Effort normal. She has decreased breath sounds. She has rales.  Decreased breath sounds to the lower fields bilaterally. Bilateral rales. No wheezing. No evidence of respiratory distress.   Abdominal: Soft. Normal appearance. There is no tenderness. There is no rigidity and no guarding.  Musculoskeletal: Normal range of motion.       Thoracic back: She exhibits no tenderness.       Lumbar back: She exhibits no tenderness.  No bilateral lower extremity edema   Neurological: She is alert and oriented to person, place, and time.  Skin: Skin is warm and dry. Capillary refill takes less than 2 seconds.  Psychiatric: She has a normal mood and affect. Her speech is normal.  Nursing note and vitals reviewed.    ED Treatments / Results  Labs (all labs ordered are listed, but only abnormal results are displayed) Labs Reviewed  BASIC  METABOLIC PANEL - Abnormal; Notable for the following:       Result Value   Glucose, Bld 125 (*)    All other components within normal limits  CBC - Abnormal; Notable for the following:    Hemoglobin 10.3 (*)    HCT 33.6 (*)    MCV 77.1 (*)    MCH 23.6 (*)    All other components within normal limits  BRAIN NATRIURETIC PEPTIDE - Abnormal; Notable for the following:    B  Natriuretic Peptide 209.2 (*)    All other components within normal limits  HEMOGLOBIN A1C - Abnormal; Notable for the following:    Hgb A1c MFr Bld 6.5 (*)    All other components within normal limits  TROPONIN I  TROPONIN I  TROPONIN I  BASIC METABOLIC PANEL  I-STAT TROPONIN, ED    EKG  EKG Interpretation None       Radiology Dg Chest 2 View  Result Date: 04/27/2017 CLINICAL DATA:  Shortness of breath. Cough. Chest pain. Previous myocardial infarct. EXAM: CHEST  2 VIEW COMPARISON:  08/14/2015 FINDINGS: The heart size and mediastinal contours are within normal limits. New mild diffuse interstitial infiltrates with Kerley B-lines, consistent with mild interstitial edema. No evidence of pulmonary consolidation or pleural effusion. IMPRESSION: Mild pulmonary interstitial infiltrates, most likely due to edema. Electronically Signed   By: Earle Gell M.D.   On: 04/27/2017 11:53    Procedures Procedures (including critical care time)  Medications Ordered in ED Medications  methocarbamol (ROBAXIN) tablet 500 mg (not administered)  aspirin EC tablet 325 mg (not administered)  topiramate (TOPAMAX) tablet 50 mg (50 mg Oral Given 04/27/17 2216)  traZODone (DESYREL) tablet 100 mg (100 mg Oral Given 04/27/17 2216)  DULoxetine (CYMBALTA) DR capsule 60 mg (60 mg Oral Given 04/27/17 2216)  nitroGLYCERIN (NITROSTAT) SL tablet 0.4 mg (not administered)  pantoprazole (PROTONIX) EC tablet 40 mg (not administered)  atorvastatin (LIPITOR) tablet 40 mg (not administered)  cholecalciferol (VITAMIN D) tablet 1,000 Units (not  administered)  levothyroxine (SYNTHROID, LEVOTHROID) tablet 50 mcg (not administered)  metoprolol tartrate (LOPRESSOR) tablet 25 mg (25 mg Oral Given 04/27/17 2216)  enoxaparin (LOVENOX) injection 40 mg (40 mg Subcutaneous Given 04/27/17 2215)  sodium chloride flush (NS) 0.9 % injection 3 mL (3 mLs Intravenous Given 04/27/17 2217)  sodium chloride flush (NS) 0.9 % injection 3 mL (not administered)  0.9 %  sodium chloride infusion (not administered)  acetaminophen (TYLENOL) tablet 650 mg (not administered)    Or  acetaminophen (TYLENOL) suppository 650 mg (not administered)  senna-docusate (Senokot-S) tablet 1 tablet (not administered)  ondansetron (ZOFRAN) tablet 4 mg (not administered)    Or  ondansetron (ZOFRAN) injection 4 mg (not administered)  furosemide (LASIX) tablet 40 mg (not administered)  hydrALAZINE (APRESOLINE) injection 10 mg (not administered)  fentaNYL (SUBLIMAZE) injection 50 mcg (50 mcg Intravenous Given 04/27/17 1441)  furosemide (LASIX) tablet 20 mg (20 mg Oral Given 04/27/17 1716)     Initial Impression / Assessment and Plan / ED Course  I have reviewed the triage vital signs and the nursing notes.  Pertinent labs & imaging results that were available during my care of the patient were reviewed by me and considered in my medical decision making (see chart for details).     66 year old female with past medical history of hypertension, CAD who presents with 2 days of progressively worsening shortness of breath. Worse with exertion and laying flat. No chest pain, no wheezing, no fevers/chills. Patient is afebrile, non-toxic appearing. Vital signs reviewed. Patient is slightly hypertensive. O2 sat is 95% on room air. Consider ACS versus new onset CHF versus acute infectious etiology. History/physical exam or not concerning for PE. Initial labs and imaging ordered at triage including CBC, BMP, troponin, EKG, chest x-ray. Will add additional BNP. She reports that she is having  some back pain now, we'll plan to give her analgesics here in the department.  Labs and imaging reviewed. Troponin negative. BMP shows hyperglycemia otherwise unremarkable. CBC shows  slight anemia but records reviewed show that is consistent with previous. EKG shows normal sinus rhythm rate 71. BNP is slightly elevated at 209.2. CXR shows interstitial edema with Kerley B lines. Symptoms could be early new onset heart failure. Would likely benefit form admission for further workup and echo evaluation. IV Lasix started in the department. Will consult cardiology and hospitalist for admission.   Discussed with Dr. Bonner Puna (hospitalist). Unclear if patient needs admission given reassuring vitals, mildly elevated BNP and overall well appearance. If patient is stable, we could start her on daily Lasix and have her follow-up with her PCP.  Patient has had one episode of O2 sats at 93% but quickly improved. She does have a slightly elevated. Dr. Bonner Puna will come evaluate patient in the ED.   Discussed with Dr.Grunz after evaluation. Will admit patient for further workup.    Final Clinical Impressions(s) / ED Diagnoses   Final diagnoses:  SOB (shortness of breath)    New Prescriptions Current Discharge Medication List       Desma Mcgregor 04/28/17 0142    Duffy Bruce, MD 04/28/17 [phone]

## 2017-04-27 NOTE — H&P (Signed)
History and Physical   ZRIYAH KOPPLIN OIN:[id number] DOB: Nov 16, 1950 DOA: 04/27/2017  Referring MD/NP/PA: Providence Lanius, PA, EDP PCP: Jonathon Jordan, MD  Patient coming from: Home  Chief Complaint: Shortness of breath  HPI: EILIS CHESTNUTT is a 66 y.o. female with a history of HTN, HLD, CAD with h/o MI 2010 s/p DES to RCA 2013, morbid obesity, and hypothyroidism who presented to the ED for progressive dyspnea, orthopnea, and chest tightness. She reported noticing gradually worsening shortness of breath associated with chest tightness on exertion 2 days ago, improved with rest. She denies true chest pain, but also states she had no chest pain with previous MI. NTG improved these symptoms some of the time. She noted nonproductive cough and new orthopnea during this time without PND. She feels her pants fit more tightly during this time but denies leg swelling. She had orthopedic surgery June 2018, but no other DVT risk factors.   ED Course: On arrival she was not hypoxic at rest, but noted to have mildly labored breathing. CXR showed interstitial edema with Kerley B lines without cardiomegaly. BNP equivocal at 209.2, ECG NSR without ST changes and troponin negative. Hospitalists were asked to evaluate in the ED.   Review of Systems: No fevers, chills, palpitations, abdominal pain, N/V/D, rashes, bleeding/bruising, and per HPI. +intermittent HA's at baseline. All others reviewed and are negative.   Past Medical History:  Diagnosis Date  . Anxiety   . Arthritis    "neck, back, hands, knees, hips" (08/18/2015)  . 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 Chronic lower back pain   . Coronary artery disease    2010 MI  . Depression   . Family history of adverse reaction to anesthesia    her mother gets sick  . GERD (gastroesophageal reflux disease)   . Hyperlipidemia   . Hypertension   . Hypothyroidism since 02/2015  . Macular degeneration, bilateral   . Migraine    "q  couple weeks recently" (08/18/2015)  . Morbid obesity ([place]) 05/10/2015  . Myocardial infarction ([place]) 2012  . Temporary low platelet count ([place]) 2012   checked every 6 mths...kept her in for 1 week with MI, and she's not sure exactly why   Past Surgical History:  Procedure Laterality Date  . BACK SURGERY    . BREAST BIOPSY Left 08/2015  . BREAST LUMPECTOMY WITH RADIOACTIVE SEED LOCALIZATION Left 11/23/2015   Procedure: LEFT BREAST LUMPECTOMY WITH RADIOACTIVE SEED LOCALIZATION;  Surgeon: Fanny Skates, MD;  Location: [facility];  Service: General;  Laterality: Left;  . CARDIAC CATHETERIZATION  2013  . CHOLECYSTECTOMY OPEN    . CORONARY ANGIOPLASTY WITH STENT PLACEMENT  2013   12/09/11 95% stenosis proximal RCA->DES; otherwise no significant CAD  . EYE SURGERY     cataract right eye   . GASTRIC RESTRICTION SURGERY  1974  . JOINT REPLACEMENT Right    knee  . MAXIMUM ACCESS (MAS)POSTERIOR LUMBAR INTERBODY FUSION (PLIF) 1 LEVEL N/A 08/18/2015   Procedure: Lumbar four-five Maximum access posterior lumbar interbody fusion;  Surgeon: Erline Levine, MD;  Location: [facility];  Service: Neurosurgery;  Laterality: N/A;  L4-5 Maximum access posterior lumbar interbody fusion  . REPLACEMENT TOTAL KNEE Right 2007  . TONSILLECTOMY    . TOTAL HIP ARTHROPLASTY Right 11/01/2016   Procedure: RIGHT TOTAL HIP ARTHROPLASTY ANTERIOR APPROACH;  Surgeon: Mcarthur Rossetti, MD;  Location: [facility];  Service: Orthopedics;  Laterality: Right;  . TOTAL KNEE ARTHROPLASTY Left 02/28/2017   Procedure:  LEFT TOTAL KNEE ARTHROPLASTY;  Surgeon: Mcarthur Rossetti, MD;  Location: [facility];  Service: Orthopedics;  Laterality: Left;   - Nonsmoker. No EtOH or other drugs.   reports that she has never smoked. She has never used smokeless tobacco. She reports that she does not drink alcohol or use drugs. Allergies  Allergen Reactions  . Sulfa Antibiotics Shortness Of Breath   Family History  Problem Relation Age of Onset  .  Stomach cancer Mother   . Heart disease Sister   . Cancer Brother        X3 BROTHERS  . Bipolar disorder Daughter   . Bipolar disorder Daughter   . Thyroid disease Daughter   . Thyroid disease Grandchild    - CAD in sister, family history otherwise reviewed and not pertinent.  Prior to Admission medications   Medication Sig Start Date End Date Taking? Authorizing Provider  aspirin EC 325 MG EC tablet Take 1 tablet (325 mg total) by mouth 2 (two) times daily after a meal. 03/03/17  Yes Mcarthur Rossetti, MD  atorvastatin (LIPITOR) 40 MG tablet Take 40 mg by mouth daily.  03/25/15  Yes [provider]  cholecalciferol (VITAMIN D) 1000 UNITS tablet Take 1,000 Units by mouth daily.   Yes [provider]  DULoxetine (CYMBALTA) 60 MG capsule Take 60 mg by mouth 2 (two) times daily.   Yes [provider]  levothyroxine (SYNTHROID, LEVOTHROID) 50 MCG tablet Take 50 mcg by mouth daily before breakfast.  04/14/15  Yes [provider]  meloxicam (MOBIC) 15 MG tablet Take 1 tablet (15 mg total) by mouth daily as needed for pain. 04/14/17  Yes Mcarthur Rossetti, MD  methocarbamol (ROBAXIN) 500 MG tablet Take 1 tablet (500 mg total) by mouth every 6 (six) hours as needed for muscle spasms. 03/12/17  Yes Mcarthur Rossetti, MD  metoprolol tartrate (LOPRESSOR) 25 MG tablet Take 25 mg by mouth 2 (two) times daily.  04/25/15  Yes [provider]  nitroGLYCERIN (NITROSTAT) 0.4 MG SL tablet Place 0.4 mg under the tongue every 5 (five) minutes as needed for chest pain. Reported on 10/09/2015   Yes [provider]  omeprazole (PRILOSEC) 20 MG capsule Take 20 mg by mouth daily before breakfast.  06/02/15  Yes [provider]  topiramate (TOPAMAX) 50 MG tablet Take 50 mg by mouth at bedtime.   Yes [provider]  traZODone (DESYREL) 100 MG tablet Take 100 mg by mouth at bedtime.   Yes [provider]  acetaminophen-codeine  (TYLENOL #3) 300-30 MG tablet TAKE 1 TO 2 TABLETS BY MOUTH EVERY 8 HOURS AS NEEDED FOR MODERATE PAIN Patient not taking: Reported on 04/27/2017 12/09/16   Mcarthur Rossetti, MD  oxyCODONE-acetaminophen (ROXICET) 5-325 MG tablet Take 1-2 tablets by mouth every 6 (six) hours as needed. Patient not taking: Reported on 04/27/2017 03/12/17   Mcarthur Rossetti, MD  tiZANidine (ZANAFLEX) 4 MG tablet Take 1 tablet (4 mg total) by mouth 2 (two) times daily as needed for muscle spasms. Patient not taking: Reported on 04/27/2017 03/13/17   Mcarthur Rossetti, MD    Physical Exam: Vitals:   04/27/17 1445 04/27/17 1500 04/27/17 1515 04/27/17 1530  BP: (!) 158/71 (!) 170/71 (!) 178/75 (!) 190/64  Pulse: 64 68 69 69  Resp: 11 (!) 24 (!) 22 (!) 21  Temp:      TempSrc:      SpO2: 92% 95% 93% 95%   Constitutional: 66 y.o. female  in no distress, calm demeanor Eyes: Lids and conjunctivae normal, PERRL ENMT: Mucous membranes are moist, upper dentures. Posterior pharynx clear of any exudate or lesions.  Neck: normal, supple, no masses, no thyromegaly Respiratory: Runs out of breath with longer sentences, tachypneic without accessory muscle use. Bibasilar crackles. No wheezing. Cardiovascular: Regular rate and rhythm, no murmurs, rubs, or gallops. No carotid bruits. No JVD. very minimal symmetric LE edema. 2+ pedal pulses. Abdomen: Normoactive bowel sounds. No tenderness, non-distended, and no masses palpated. No hepatosplenomegaly. GU: No indwelling catheter Musculoskeletal: No clubbing / cyanosis. No joint deformity upper and lower extremities. Good ROM, no contractures. Normal muscle tone.  Skin: Warm, dry. LE varicosities but no other significant lesions noted.  Neurologic: CN II-XII grossly intact. Gait not assessed. Speech normal. No focal deficits in motor strength or sensation in all extremities.  Psychiatric: Alert and oriented x3. Normal judgment and insight. Mood euthymic with congruent  affect.   Labs on Admission: I have personally reviewed following labs and imaging studies  CBC:  Recent Labs Lab 04/27/17 1139  WBC 8.5  HGB 10.3*  HCT 33.6*  MCV 77.1*  PLT 540   Basic Metabolic Panel:  Recent Labs Lab 04/27/17 1139  NA 136  K 3.5  CL 103  CO2 26  GLUCOSE 125*  BUN 13  CREATININE 0.78  CALCIUM 9.0   GFR: CrCl cannot be calculated (Unknown ideal weight.).  Radiological Exams on Admission: Dg Chest 2 View  Result Date: 04/27/2017 CLINICAL DATA:  Shortness of breath. Cough. Chest pain. Previous myocardial infarct. EXAM: CHEST  2 VIEW COMPARISON:  08/14/2015 FINDINGS: The heart size and mediastinal contours are within normal limits. New mild diffuse interstitial infiltrates with Kerley B-lines, consistent with mild interstitial edema. No evidence of pulmonary consolidation or pleural effusion. IMPRESSION: Mild pulmonary interstitial infiltrates, most likely due to edema. Electronically Signed   By: Earle Gell M.D.   On: 04/27/2017 11:53    EKG: Independently reviewed. NSR, vent rate 71bpm, normal axis, intervals wnl without ST changes.   Assessment/Plan Principal Problem:   Acute pulmonary edema (HCC) Active Problems:   Morbid obesity (HCC)   Coronary artery disease   Chronic lower back pain   Hypothyroidism   Hypertension   GERD (gastroesophageal reflux disease)   Depression   Hyperlipidemia   Acute pulmonary edema: Possibly new Dx CHF in pt with CAD. Though not hypoxic, her breathing is more labored than baseline. Lower suspicion for PE, no infiltrate on CXR.  - Lasix given in ED - Weigh now and in AM, follow I/O - Echocardiogram ordered - Cardiology consulted by EDP - Monitor telemetry   CAD, HLD: h/o MI and DES to RCA 2013. Myoview Sept 2016 low risk, normal wall motion without perfusion defects, LVEF 65%. Has atypical symptoms, but had atypical symptoms in the past with ischemia. - Cycle cardiac enzymes, repeat ECG in AM; initial  reassuring - Continue metoprolol, ASA, statin - NTG prn, oxygen prn  Essential HTN: Took medications today, but uncontrolled in ED.  - Continue metoprolol - Hydralazine IV prn severe BP elevation.   Hypothyroidism: Chronic, stable dose, and recently "normal" thyroid testing by PCP.  - Continue synthroid  Chronic lower back pain:  - Continue home medications  Depression and insomnia: Chronic, stable  - Continue home medications  GERD: Chronic, stable. - Formulary PPI  Morbid obesity:  - Include HbA1c for risk stratification  DVT prophylaxis: Lovenox  Code Status: Full confirmed at admission  Family Communication: Son at  bedside Disposition Plan: Eventual DC home Consults called: Cardiology by EDP.   Admission status: Observation    Vance Gather, MD [facility] Pager [phone]  If 7PM-7AM, please contact night-coverage [email or web address] Password [facility] 04/27/2017, 5:46 PM

## 2017-04-28 ENCOUNTER — Observation Stay (HOSPITAL_BASED_OUTPATIENT_CLINIC_OR_DEPARTMENT_OTHER): Payer: Medicare HMO

## 2017-04-28 ENCOUNTER — Other Ambulatory Visit (HOSPITAL_COMMUNITY): Payer: Medicare HMO

## 2017-04-28 DIAGNOSIS — I5031 Acute diastolic (congestive) heart failure: Secondary | ICD-10-CM | POA: Diagnosis not present

## 2017-04-28 DIAGNOSIS — I110 Hypertensive heart disease with heart failure: Secondary | ICD-10-CM | POA: Diagnosis not present

## 2017-04-28 DIAGNOSIS — G8929 Other chronic pain: Secondary | ICD-10-CM | POA: Diagnosis not present

## 2017-04-28 DIAGNOSIS — I351 Nonrheumatic aortic (valve) insufficiency: Secondary | ICD-10-CM | POA: Diagnosis not present

## 2017-04-28 DIAGNOSIS — E119 Type 2 diabetes mellitus without complications: Secondary | ICD-10-CM

## 2017-04-28 DIAGNOSIS — F329 Major depressive disorder, single episode, unspecified: Secondary | ICD-10-CM | POA: Diagnosis not present

## 2017-04-28 DIAGNOSIS — E039 Hypothyroidism, unspecified: Secondary | ICD-10-CM | POA: Diagnosis not present

## 2017-04-28 DIAGNOSIS — Z6841 Body Mass Index (BMI) 40.0 and over, adult: Secondary | ICD-10-CM | POA: Diagnosis not present

## 2017-04-28 DIAGNOSIS — K219 Gastro-esophageal reflux disease without esophagitis: Secondary | ICD-10-CM | POA: Diagnosis not present

## 2017-04-28 DIAGNOSIS — I2510 Atherosclerotic heart disease of native coronary artery without angina pectoris: Secondary | ICD-10-CM | POA: Diagnosis not present

## 2017-04-28 DIAGNOSIS — E1169 Type 2 diabetes mellitus with other specified complication: Secondary | ICD-10-CM

## 2017-04-28 DIAGNOSIS — J810 Acute pulmonary edema: Secondary | ICD-10-CM | POA: Diagnosis not present

## 2017-04-28 DIAGNOSIS — M545 Low back pain: Secondary | ICD-10-CM | POA: Diagnosis not present

## 2017-04-28 DIAGNOSIS — F419 Anxiety disorder, unspecified: Secondary | ICD-10-CM | POA: Diagnosis not present

## 2017-04-28 DIAGNOSIS — E785 Hyperlipidemia, unspecified: Secondary | ICD-10-CM | POA: Diagnosis not present

## 2017-04-28 DIAGNOSIS — E669 Obesity, unspecified: Secondary | ICD-10-CM

## 2017-04-28 DIAGNOSIS — I10 Essential (primary) hypertension: Secondary | ICD-10-CM | POA: Diagnosis not present

## 2017-04-28 HISTORY — DX: Type 2 diabetes mellitus with other specified complication: E66.9

## 2017-04-28 HISTORY — DX: Type 2 diabetes mellitus with other specified complication: E11.69

## 2017-04-28 LAB — BASIC METABOLIC PANEL
Anion gap: 9 (ref 5–15)
BUN: 10 mg/dL (ref 6–20)
CO2: 27 mmol/L (ref 22–32)
Calcium: 9 mg/dL (ref 8.9–10.3)
Chloride: 103 mmol/L (ref 101–111)
Creatinine, Ser: 0.82 mg/dL (ref 0.44–1.00)
GFR calc Af Amer: >60 mL/min (ref >60)
GFR calc non Af Amer: >60 mL/min (ref >60)
Glucose, Bld: 122 mg/dL — ABNORMAL HIGH (ref 65–99)
Potassium: 3.3 mmol/L — ABNORMAL LOW (ref 3.5–5.1)
Sodium: 139 mmol/L (ref 135–145)

## 2017-04-28 LAB — ECHOCARDIOGRAM COMPLETE
Height: 63 in
Weight: 4510.4 oz

## 2017-04-28 LAB — TROPONIN I
Troponin I: <0.03 ng/mL (ref <0.03)
Troponin I: <0.03 ng/mL (ref <0.03)

## 2017-04-28 LAB — GLUCOSE, CAPILLARY
Glucose-Capillary: 135 mg/dL — ABNORMAL HIGH (ref 65–99)
Glucose-Capillary: 173 mg/dL — ABNORMAL HIGH (ref 65–99)

## 2017-04-28 MED ORDER — POTASSIUM CHLORIDE CRYS ER 10 MEQ PO TBCR: 10.0000 meq | EXTENDED_RELEASE_TABLET | Freq: Every day | ORAL | 0 refills | Status: DC

## 2017-04-28 MED ORDER — FUROSEMIDE 20 MG PO TABS: 20.0000 mg | ORAL_TABLET | Freq: Every day | ORAL | 0 refills | Status: AC

## 2017-04-28 MED ORDER — POTASSIUM CHLORIDE CRYS ER 20 MEQ PO TBCR
20.0000 meq | EXTENDED_RELEASE_TABLET | Freq: Once | ORAL | Status: AC
  Administered 2017-04-28: 20 meq via ORAL
  Filled 2017-04-28: qty 1

## 2017-04-28 MED ORDER — ENOXAPARIN SODIUM 60 MG/0.6ML ~~LOC~~ SOLN: 60.0000 mg | SUBCUTANEOUS | Status: DC

## 2017-04-28 NOTE — Progress Notes (Signed)
NURSING PROGRESS NOTE  VERGENE MARLAND [id number] Discharge Data: 04/28/2017 2:29 PM Attending Provider: Patrecia Pour, MD JQB:HALPFXT, Ivin Booty, MD     [name] to be D/C'd Home per MD order.  Discussed with the patient the After Visit Summary and all questions fully answered. All IV's discontinued with no bleeding noted. All belongings returned to patient for patient to take home.   Last Vital Signs:  Blood pressure 129/60, pulse 77, temperature 98.3 F (36.8 C), temperature source Oral, resp. rate 18, height 5\' 3"  (1.6 m), weight 127.9 kg (281 lb 14.4 oz), SpO2 95 %.  Discharge Medication List Allergies as of 04/28/2017      Reactions   Sulfa Antibiotics Shortness Of Breath      Medication List    TAKE these medications   acetaminophen-codeine 300-30 MG tablet Commonly known as:  TYLENOL #3 TAKE 1 TO 2 TABLETS BY MOUTH EVERY 8 HOURS AS NEEDED FOR MODERATE PAIN   aspirin 325 MG EC tablet Take 1 tablet (325 mg total) by mouth 2 (two) times daily after a meal.   atorvastatin 40 MG tablet Commonly known as:  LIPITOR Take 40 mg by mouth daily.   cholecalciferol 1000 units tablet Commonly known as:  VITAMIN D Take 1,000 Units by mouth daily.   DULoxetine 60 MG capsule Commonly known as:  CYMBALTA Take 60 mg by mouth 2 (two) times daily.   furosemide 20 MG tablet Commonly known as:  LASIX Take 1 tablet (20 mg total) by mouth daily.   levothyroxine 50 MCG tablet Commonly known as:  SYNTHROID, LEVOTHROID Take 50 mcg by mouth daily before breakfast.   meloxicam 15 MG tablet Commonly known as:  MOBIC Take 1 tablet (15 mg total) by mouth daily as needed for pain.   methocarbamol 500 MG tablet Commonly known as:  ROBAXIN Take 1 tablet (500 mg total) by mouth every 6 (six) hours as needed for muscle spasms.   metoprolol tartrate 25 MG tablet Commonly known as:  LOPRESSOR Take 25 mg by mouth 2 (two) times daily.   nitroGLYCERIN 0.4 MG SL tablet Commonly known  as:  NITROSTAT Place 0.4 mg under the tongue every 5 (five) minutes as needed for chest pain. Reported on 10/09/2015   omeprazole 20 MG capsule Commonly known as:  PRILOSEC Take 20 mg by mouth daily before breakfast.   oxyCODONE-acetaminophen 5-325 MG tablet Commonly known as:  ROXICET Take 1-2 tablets by mouth every 6 (six) hours as needed.   potassium chloride 10 MEQ tablet Commonly known as:  K-DUR,KLOR-CON Take 1 tablet (10 mEq total) by mouth daily.   tiZANidine 4 MG tablet Commonly known as:  ZANAFLEX Take 1 tablet (4 mg total) by mouth 2 (two) times daily as needed for muscle spasms.   topiramate 50 MG tablet Commonly known as:  TOPAMAX Take 50 mg by mouth at bedtime.   traZODone 100 MG tablet Commonly known as:  DESYREL Take 100 mg by mouth at bedtime.            Discharge Care Instructions        Start     Ordered   04/29/17 0000  furosemide (LASIX) 20 MG tablet  Daily     04/28/17 1353   04/28/17 0000  potassium chloride SA (K-DUR,KLOR-CON) 10 MEQ tablet  Daily     04/28/17 1353   04/28/17 0000  Increase activity slowly     04/28/17 1353   04/28/17 0000  Diet - low sodium  heart healthy     04/28/17 1353   04/28/17 0000  Discharge instructions    Comments:  You were admitted for chest tightness and difficulty breathing. These have improved with treatment with a diuretic. Work up for chest pain was negative for heart damage. Echocardiogram showed evidence of mild difficulty in relaxation of the heart, called diastolic dysfunction. This is a form of heart failure that can be managed with lasix. You are stable for discharge with the following recommendations:  - Start taking lasix 20mg  once daily in the morning. Every day that you take lasix, you will need to take a potassium pill (16mEq) because lasix makes your body lose potassium.  - Please follow up with Dr. Alaiza Yau Acre in the next week with repeat labs to monitor your kidney function and potassium level.  - If  your symptoms return, seek medical attention right away.   04/28/17 [place], RN

## 2017-04-28 NOTE — Progress Notes (Signed)
*   Echocardiogram 2D Echocardiogram has been performed.  Isidro Monks T Dawn Davidson 04/28/2017, 10:54 AM

## 2017-04-28 NOTE — Discharge Summary (Signed)
Physician Discharge Summary  Dawn Davidson [facility] PJA:[id number] DOB: 1951-04-25 DOA: 04/27/2017  PCP: Dawn Jordan, MD  Admit date: 04/27/2017 Discharge date: 04/28/2017  Admitted From: Home Disposition: Home   Recommendations for Outpatient Follow-up:  1. Follow up with PCP in the next week.  2. Started lasix 20mg  daily and potassium 88mEq daily. Please follow up volume status and BMP. 3. HbA1c 6.5%. Therapy deferred to PCP.  Home Health: None Equipment/Devices: None Discharge Condition: Stable CODE STATUS: Full Diet recommendation: Heart healthy  Brief/Interim Summary: Dawn Davidson is a 66 y.o. female with a history of HTN, HLD, CAD with h/o MI 2010 s/p DES to RCA 2013, morbid obesity, and hypothyroidism who presented to the ED for progressive dyspnea, orthopnea, and chest tightness. She reported noticing gradually worsening shortness of breath associated with chest tightness on exertion 2 days ago, improved with rest. She denies true chest pain, but also states she had no chest pain with previous MI. NTG improved these symptoms some of the time. She noted nonproductive cough and new orthopnea during this time without PND. She feels her pants fit more tightly during this time but denies leg swelling. She had orthopedic surgery June 2018, but no other DVT risk factors.   On arrival she was not hypoxic at rest, but noted to have mildly labored breathing. CXR showed interstitial edema with Kerley B lines without cardiomegaly. BNP equivocal at 209.2, ECG NSR without ST changes and troponin negative. She was brought in for observation. Cardiac enzymes remained negative, ECG remained nonischemic, and lasix 20mg  was given with improvement in volume status and resolution of symptoms. Potassium down to 3.3. Echocardiogram shows grade 1 diastolic dysfunction with normal LV systolic function and no wall motion abnormalities.   Discharge Diagnoses:  Principal Problem:   Acute pulmonary edema  (HCC) Active Problems:   Morbid obesity ([place])   Coronary artery disease   Chronic lower back pain   Hypothyroidism   Hypertension   GERD (gastroesophageal reflux disease)   Depression   Hyperlipidemia  Acute diastolic CHF: New Dx based on echocardiogram 8/27 and clinical findings of pulmonary edema. No hypoxia or labored breathing after initiating lasix.  - Continue lasix 20mg  po daily (had adequate UOP from this with stable Cr)  - Will replete potassium here and order 4mEq daily potassium. Follow up in the next week with PCP advised.   CAD, HLD: h/o MI and DES to RCA 2013. Myoview Sept 2016 low risk, normal wall motion without perfusion defects, LVEF 65%. Has atypical symptoms, but had atypical symptoms in the past with ischemia. - Cardiac enzymes negative, nonischemic ECG, atypical symptoms now resolved. Not consistent with ACS.  - Continue metoprolol, ASA, statin - NTG prn   Essential HTN:  - continue home medications  Hypothyroidism: Chronic, stable dose, and recently reportedly normal thyroid testing by PCP.  - Continue synthroid  Chronic lower back pain:  - Continue home medications  Depression and insomnia: Chronic, stable  - Continue home medications  GERD: Chronic, stable. - PPI  Morbid obesity: with HbA1c 6.5%.  - Consider starting metformin and lifestyle modifications.   Discharge Instructions Discharge Instructions    Diet - low sodium heart healthy    Complete by:  As directed    Discharge instructions    Complete by:  As directed    You were admitted for chest tightness and difficulty breathing. These have improved with treatment with a diuretic. Work up for chest pain was negative for heart damage. Echocardiogram showed  evidence of mild difficulty in relaxation of the heart, called diastolic dysfunction. This is a form of heart failure that can be managed with lasix. You are stable for discharge with the following recommendations:  - Start taking  lasix 20mg  once daily in the morning. Every day that you take lasix, you will need to take a potassium pill (47mEq) because lasix makes your body lose potassium.  - Please follow up with Dr. Stephanie Acre in the next week with repeat labs to monitor your kidney function and potassium level.  - If your symptoms return, seek medical attention right away.   Increase activity slowly    Complete by:  As directed      Allergies as of 04/28/2017      Reactions   Sulfa Antibiotics Shortness Of Breath      Medication List    TAKE these medications   acetaminophen-codeine 300-30 MG tablet Commonly known as:  TYLENOL #3 TAKE 1 TO 2 TABLETS BY MOUTH EVERY 8 HOURS AS NEEDED FOR MODERATE PAIN   aspirin 325 MG EC tablet Take 1 tablet (325 mg total) by mouth 2 (two) times daily after a meal.   atorvastatin 40 MG tablet Commonly known as:  LIPITOR Take 40 mg by mouth daily.   cholecalciferol 1000 units tablet Commonly known as:  VITAMIN D Take 1,000 Units by mouth daily.   DULoxetine 60 MG capsule Commonly known as:  CYMBALTA Take 60 mg by mouth 2 (two) times daily.   furosemide 20 MG tablet Commonly known as:  LASIX Take 1 tablet (20 mg total) by mouth daily.   levothyroxine 50 MCG tablet Commonly known as:  SYNTHROID, LEVOTHROID Take 50 mcg by mouth daily before breakfast.   meloxicam 15 MG tablet Commonly known as:  MOBIC Take 1 tablet (15 mg total) by mouth daily as needed for pain.   methocarbamol 500 MG tablet Commonly known as:  ROBAXIN Take 1 tablet (500 mg total) by mouth every 6 (six) hours as needed for muscle spasms.   metoprolol tartrate 25 MG tablet Commonly known as:  LOPRESSOR Take 25 mg by mouth 2 (two) times daily.   nitroGLYCERIN 0.4 MG SL tablet Commonly known as:  NITROSTAT Place 0.4 mg under the tongue every 5 (five) minutes as needed for chest pain. Reported on 10/09/2015   omeprazole 20 MG capsule Commonly known as:  PRILOSEC Take 20 mg by mouth daily before  breakfast.   oxyCODONE-acetaminophen 5-325 MG tablet Commonly known as:  ROXICET Take 1-2 tablets by mouth every 6 (six) hours as needed.   potassium chloride 10 MEQ tablet Commonly known as:  K-DUR,KLOR-CON Take 1 tablet (10 mEq total) by mouth daily.   tiZANidine 4 MG tablet Commonly known as:  ZANAFLEX Take 1 tablet (4 mg total) by mouth 2 (two) times daily as needed for muscle spasms.   topiramate 50 MG tablet Commonly known as:  TOPAMAX Take 50 mg by mouth at bedtime.   traZODone 100 MG tablet Commonly known as:  DESYREL Take 100 mg by mouth at bedtime.            Discharge Care Instructions        Start     Ordered   04/29/17 0000  furosemide (LASIX) 20 MG tablet  Daily     04/28/17 1353   04/28/17 0000  potassium chloride SA (K-DUR,KLOR-CON) 10 MEQ tablet  Daily     04/28/17 1353   04/28/17 0000  Increase activity slowly  04/28/17 1353   04/28/17 0000  Diet - low sodium heart healthy     04/28/17 1353   04/28/17 0000  Discharge instructions    Comments:  You were admitted for chest tightness and difficulty breathing. These have improved with treatment with a diuretic. Work up for chest pain was negative for heart damage. Echocardiogram showed evidence of mild difficulty in relaxation of the heart, called diastolic dysfunction. This is a form of heart failure that can be managed with lasix. You are stable for discharge with the following recommendations:  - Start taking lasix 20mg  once daily in the morning. Every day that you take lasix, you will need to take a potassium pill (84mEq) because lasix makes your body lose potassium.  - Please follow up with Dr. Stephanie Acre in the next week with repeat labs to monitor your kidney function and potassium level.  - If your symptoms return, seek medical attention right away.   04/28/17 1353     Follow-up Information    Dawn Jordan, MD. Schedule an appointment as soon as possible for a visit in 1 week(s).   Specialty:   Family Medicine Contact information: [address]  [place] [address] [phone]          Allergies  Allergen Reactions  . Sulfa Antibiotics Shortness Of Breath    Consultations:  None  Procedures/Studies: Dg Chest 2 View  Result Date: 04/27/2017 CLINICAL DATA:  Shortness of breath. Cough. Chest pain. Previous myocardial infarct. EXAM: CHEST  2 VIEW COMPARISON:  08/14/2015 FINDINGS: The heart size and mediastinal contours are within normal limits. New mild diffuse interstitial infiltrates with Kerley B-lines, consistent with mild interstitial edema. No evidence of pulmonary consolidation or pleural effusion. IMPRESSION: Mild pulmonary interstitial infiltrates, most likely due to edema. Electronically Signed   By: Earle Gell M.D.   On: 04/27/2017 11:53   Echocardiogram 04/28/2017: - Left ventricle: The cavity size was normal. There was mild   concentric hypertrophy. Systolic function was vigorous. The   estimated ejection fraction was in the range of 65% to 70%. Wall   motion was normal; there were no regional wall motion   abnormalities. Doppler parameters are consistent with abnormal   left ventricular relaxation (grade 1 diastolic dysfunction).   There was no evidence of elevated ventricular filling pressure by   Doppler parameters. - Aortic valve: There was moderate regurgitation. - Aortic root: The aortic root was normal in size. - Mitral valve: Calcified annulus. Mildly thickened leaflets .   There was trivial regurgitation. Valve area by pressure   half-time: 1.62 cm^2. - Left atrium: The atrium was normal in size. - Right ventricle: Systolic function was normal. - Right atrium: The atrium was normal in size. - Tricuspid valve: There was trivial regurgitation. - Pulmonic valve: There was no regurgitation. - Pulmonary arteries: Systolic pressure was within the normal   range. - Inferior vena cava: The vessel was normal in size. -  Pericardium, extracardiac: There was no pericardial effusion.  Subjective: Breathing improved, no chest pain or tightness. No palpitations. No leg swelling.   Discharge Exam: BP 129/60 (BP Location: Right Arm)   Pulse 77   Temp 98.3 F (36.8 C) (Oral)   Resp 18   Ht 5\' 3"  (1.6 m)   Wt 127.9 kg (281 lb 14.4 oz)   SpO2 95%   BMI 49.94 kg/m   General: Pleasant 66yo F in no distress eating breakfast.  Cardiovascular: RRR, no murmur, JVD, or edema Respiratory:  Nonlabored and clear Abdominal: Soft, NT, ND, bowel sounds +  Labs: BNP (last 3 results)  Recent Labs  04/27/17 1400  BNP 264.1*   Basic Metabolic Panel:  Recent Labs Lab 04/27/17 1139 04/28/17 0551  NA 136 139  K 3.5 3.3*  CL 103 103  CO2 26 27  GLUCOSE 125* 122*  BUN 13 10  CREATININE 0.78 0.82  CALCIUM 9.0 9.0   CBC:  Recent Labs Lab 04/27/17 1139  WBC 8.5  HGB 10.3*  HCT 33.6*  MCV 77.1*  PLT 215   Cardiac Enzymes:  Recent Labs Lab 04/27/17 1943 04/28/17 0001 04/28/17 0551  TROPONINI <0.03 <0.03 <0.03   CBG:  Recent Labs Lab 04/28/17 0900 04/28/17 1220  GLUCAP 135* 173*   Hgb A1c  Recent Labs  04/27/17 1943  HGBA1C 6.5*    Time coordinating discharge: Approximately 40 minutes  Vance Gather, MD  [facility] 04/28/2017, 1:53 PM Pager [phone]

## 2017-05-01 ENCOUNTER — Ambulatory Visit: Payer: Medicare HMO | Admitting: Physical Therapy

## 2017-05-06 DIAGNOSIS — E876 Hypokalemia: Secondary | ICD-10-CM | POA: Diagnosis not present

## 2017-05-06 DIAGNOSIS — I5031 Acute diastolic (congestive) heart failure: Secondary | ICD-10-CM | POA: Diagnosis not present

## 2017-05-06 DIAGNOSIS — L989 Disorder of the skin and subcutaneous tissue, unspecified: Secondary | ICD-10-CM | POA: Diagnosis not present

## 2017-05-06 DIAGNOSIS — Z23 Encounter for immunization: Secondary | ICD-10-CM | POA: Diagnosis not present

## 2017-05-08 ENCOUNTER — Other Ambulatory Visit (INDEPENDENT_AMBULATORY_CARE_PROVIDER_SITE_OTHER): Payer: Self-pay | Admitting: Orthopaedic Surgery

## 2017-05-08 NOTE — Telephone Encounter (Signed)
Please advise 

## 2017-05-13 ENCOUNTER — Telehealth (INDEPENDENT_AMBULATORY_CARE_PROVIDER_SITE_OTHER): Payer: Self-pay

## 2017-05-13 ENCOUNTER — Other Ambulatory Visit (INDEPENDENT_AMBULATORY_CARE_PROVIDER_SITE_OTHER): Payer: Self-pay

## 2017-05-13 MED ORDER — TIZANIDINE HCL 4 MG PO TABS: 4.0000 mg | ORAL_TABLET | Freq: Three times a day (TID) | ORAL | 1 refills | Status: DC | PRN

## 2017-05-13 NOTE — Telephone Encounter (Signed)
Robaxin on HUGE backorder everywhere Can you sent a different muscle relaxer for her please

## 2017-05-13 NOTE — Progress Notes (Unsigned)
zanafle ?

## 2017-05-13 NOTE — Telephone Encounter (Signed)
Sent to pharmacy 

## 2017-05-13 NOTE — Telephone Encounter (Signed)
Please send in zanaflex 4 mg po every 8 hours as needed, #60. Thanks

## 2017-05-20 ENCOUNTER — Telehealth (INDEPENDENT_AMBULATORY_CARE_PROVIDER_SITE_OTHER): Payer: Self-pay

## 2017-05-20 NOTE — Telephone Encounter (Signed)
Ok to refill tomorrow (Wednesday)

## 2017-05-20 NOTE — Telephone Encounter (Signed)
Patient would like a Rx refill on Tylenol #3.  Cb# is [phone].  Please advise.  Thank You.

## 2017-05-20 NOTE — Telephone Encounter (Signed)
Please advise 

## 2017-05-21 ENCOUNTER — Other Ambulatory Visit (INDEPENDENT_AMBULATORY_CARE_PROVIDER_SITE_OTHER): Payer: Self-pay

## 2017-05-21 MED ORDER — ACETAMINOPHEN-CODEINE #3 300-30 MG PO TABS: 1.0000 | ORAL_TABLET | Freq: Four times a day (QID) | ORAL | 0 refills | Status: DC | PRN

## 2017-05-21 NOTE — Telephone Encounter (Signed)
Called into pharmacy

## 2017-07-14 ENCOUNTER — Other Ambulatory Visit (INDEPENDENT_AMBULATORY_CARE_PROVIDER_SITE_OTHER): Payer: Self-pay | Admitting: Orthopaedic Surgery

## 2017-07-14 NOTE — Telephone Encounter (Signed)
Please advise 

## 2017-10-15 ENCOUNTER — Ambulatory Visit (INDEPENDENT_AMBULATORY_CARE_PROVIDER_SITE_OTHER): Payer: Medicare HMO | Admitting: Orthopaedic Surgery

## 2017-10-17 ENCOUNTER — Other Ambulatory Visit: Payer: Self-pay | Admitting: Family Medicine

## 2017-10-17 DIAGNOSIS — Z1231 Encounter for screening mammogram for malignant neoplasm of breast: Secondary | ICD-10-CM

## 2017-10-30 DIAGNOSIS — K582 Mixed irritable bowel syndrome: Secondary | ICD-10-CM | POA: Diagnosis not present

## 2017-11-06 ENCOUNTER — Other Ambulatory Visit (HOSPITAL_COMMUNITY)
Admission: RE | Admit: 2017-11-06 | Discharge: 2017-11-06 | Disposition: A | Discharge status: Home / Self Care | Payer: Medicare HMO | Source: Ambulatory Visit | Attending: Family Medicine | Admitting: Family Medicine

## 2017-11-06 ENCOUNTER — Ambulatory Visit (INDEPENDENT_AMBULATORY_CARE_PROVIDER_SITE_OTHER): Payer: Medicare HMO | Admitting: Orthopaedic Surgery

## 2017-11-06 ENCOUNTER — Other Ambulatory Visit: Payer: Self-pay | Admitting: Family Medicine

## 2017-11-06 DIAGNOSIS — Z7984 Long term (current) use of oral hypoglycemic drugs: Secondary | ICD-10-CM | POA: Diagnosis not present

## 2017-11-06 DIAGNOSIS — Z79899 Other long term (current) drug therapy: Secondary | ICD-10-CM | POA: Diagnosis not present

## 2017-11-06 DIAGNOSIS — Z01411 Encounter for gynecological examination (general) (routine) with abnormal findings: Secondary | ICD-10-CM | POA: Insufficient documentation

## 2017-11-06 DIAGNOSIS — I252 Old myocardial infarction: Secondary | ICD-10-CM | POA: Diagnosis not present

## 2017-11-06 DIAGNOSIS — R413 Other amnesia: Secondary | ICD-10-CM | POA: Diagnosis not present

## 2017-11-06 DIAGNOSIS — E039 Hypothyroidism, unspecified: Secondary | ICD-10-CM | POA: Diagnosis not present

## 2017-11-06 DIAGNOSIS — I25110 Atherosclerotic heart disease of native coronary artery with unstable angina pectoris: Secondary | ICD-10-CM | POA: Diagnosis not present

## 2017-11-06 DIAGNOSIS — Z23 Encounter for immunization: Secondary | ICD-10-CM | POA: Diagnosis not present

## 2017-11-06 DIAGNOSIS — Z6841 Body Mass Index (BMI) 40.0 and over, adult: Secondary | ICD-10-CM | POA: Diagnosis not present

## 2017-11-06 DIAGNOSIS — E1122 Type 2 diabetes mellitus with diabetic chronic kidney disease: Secondary | ICD-10-CM | POA: Diagnosis not present

## 2017-11-06 DIAGNOSIS — Z0000 Encounter for general adult medical examination without abnormal findings: Secondary | ICD-10-CM | POA: Diagnosis not present

## 2017-11-06 DIAGNOSIS — E559 Vitamin D deficiency, unspecified: Secondary | ICD-10-CM | POA: Diagnosis not present

## 2017-11-06 DIAGNOSIS — E1165 Type 2 diabetes mellitus with hyperglycemia: Secondary | ICD-10-CM | POA: Diagnosis not present

## 2017-11-06 DIAGNOSIS — I10 Essential (primary) hypertension: Secondary | ICD-10-CM | POA: Diagnosis not present

## 2017-11-06 DIAGNOSIS — N189 Chronic kidney disease, unspecified: Secondary | ICD-10-CM | POA: Diagnosis not present

## 2017-11-07 LAB — CYTOLOGY - PAP
Diagnosis: NEGATIVE
HPV: NOT DETECTED

## 2017-11-10 ENCOUNTER — Encounter (INDEPENDENT_AMBULATORY_CARE_PROVIDER_SITE_OTHER): Payer: Self-pay | Admitting: Orthopaedic Surgery

## 2017-11-10 ENCOUNTER — Ambulatory Visit (INDEPENDENT_AMBULATORY_CARE_PROVIDER_SITE_OTHER): Payer: Medicare HMO

## 2017-11-10 ENCOUNTER — Ambulatory Visit (INDEPENDENT_AMBULATORY_CARE_PROVIDER_SITE_OTHER): Payer: Medicare HMO | Admitting: Orthopaedic Surgery

## 2017-11-10 DIAGNOSIS — Z96641 Presence of right artificial hip joint: Secondary | ICD-10-CM

## 2017-11-10 DIAGNOSIS — Z96652 Presence of left artificial knee joint: Secondary | ICD-10-CM

## 2017-11-10 NOTE — Progress Notes (Signed)
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  The right hip was replaced 12 months ago by [place].  The left knee was replaced 8 months ago by [place].  The right knee was replaced elsewhere many years ago.  Right now she says she is doing great and has no complaints at all.  She is not walking with an assistive device at all and she has no limp.  Examination of both hips through full range of motion as well as both knees through full range of motion.  Both knees feel stable in both knees feel stable.  X-rays of the hip and pelvis shows a well-seated total hip arthroplasty on the right side and no significant arthritis in the left side.  X-ray of both knees she has well-seated implants and no comp gating features.  This point since she is doing so well she will follow-up as needed.  All questions and concerns were answered and addressed.  We talked at length about things we need to bring her back for joint replacements.

## 2017-11-12 ENCOUNTER — Ambulatory Visit
Admission: RE | Admit: 2017-11-12 | Discharge: 2017-11-12 | Disposition: A | Discharge status: Home / Self Care | Payer: Medicare HMO | Source: Ambulatory Visit | Attending: Family Medicine | Admitting: Family Medicine

## 2017-11-12 DIAGNOSIS — Z1231 Encounter for screening mammogram for malignant neoplasm of breast: Secondary | ICD-10-CM

## 2017-11-18 ENCOUNTER — Other Ambulatory Visit: Payer: Self-pay | Admitting: Family Medicine

## 2017-11-18 DIAGNOSIS — E2839 Other primary ovarian failure: Secondary | ICD-10-CM

## 2017-12-12 ENCOUNTER — Ambulatory Visit
Admission: RE | Admit: 2017-12-12 | Discharge: 2017-12-12 | Disposition: A | Discharge status: Home / Self Care | Payer: Medicare HMO | Source: Ambulatory Visit | Attending: Family Medicine | Admitting: Family Medicine

## 2017-12-12 DIAGNOSIS — Z780 Asymptomatic menopausal state: Secondary | ICD-10-CM | POA: Diagnosis not present

## 2017-12-12 DIAGNOSIS — E2839 Other primary ovarian failure: Secondary | ICD-10-CM

## 2017-12-12 DIAGNOSIS — M85852 Other specified disorders of bone density and structure, left thigh: Secondary | ICD-10-CM | POA: Diagnosis not present

## 2018-01-16 DIAGNOSIS — D485 Neoplasm of uncertain behavior of skin: Secondary | ICD-10-CM | POA: Diagnosis not present

## 2018-01-16 DIAGNOSIS — L921 Necrobiosis lipoidica, not elsewhere classified: Secondary | ICD-10-CM | POA: Diagnosis not present

## 2018-01-16 DIAGNOSIS — L989 Disorder of the skin and subcutaneous tissue, unspecified: Secondary | ICD-10-CM | POA: Diagnosis not present

## 2018-02-09 DIAGNOSIS — F322 Major depressive disorder, single episode, severe without psychotic features: Secondary | ICD-10-CM | POA: Diagnosis not present

## 2018-02-09 DIAGNOSIS — E1122 Type 2 diabetes mellitus with diabetic chronic kidney disease: Secondary | ICD-10-CM | POA: Diagnosis not present

## 2018-02-09 DIAGNOSIS — N183 Chronic kidney disease, stage 3 (moderate): Secondary | ICD-10-CM | POA: Diagnosis not present

## 2018-03-11 NOTE — Progress Notes (Signed)
Cardiology Office Note:    Date:  03/12/2018   ID:  RHYA SHAN, DOB [birth year], MRN [id number]  PCP:  Jonathon Jordan, MD  Cardiologist:  Buford Dresser, MD PhD  Referring MD: Jonathon Jordan, MD   Chief Complaint  Patient presents with  . Shortness of Breath    History of Present Illness:    Dawn Davidson is a 67 y.o. female with a hx of hypertension, hyperlipidemia, coronary artery disease with MI in 2010 s/p DES to RCA in 2013, morbid obesity, and hypothyroidism who is seen as a new consult at the request of Dr. Stephanie Acre for evaluation and treatment of shortness of breath. She had a prior admission in 04/2017 for acute diastolic heart failure. She is concerned because her prior heart attack symptoms did not involve chest pain and were atypical (headaches and vague symptoms). Was told she was having a heart attack in 2007 but didn't get a stent. Had a stent in [place] at [facility] in 2013 for a nearly complete narrowing of a coronary vessel (requested records, note below from Care Everywhere). Her symptoms were shortness of breath and fatigue at that time.  Now she is very fatigued and breathing in short. Initially felt much better starting furosemide, but now short of breath with minimal exertion. Shortness of breath worst with raising arms up (like when she washes her hair). Under a lot of stress. She is able to do all her activities at home but no intentional activity. She does try to walk when she isn't have back or joint pain. No alcohol, never tobacco.   Sleeps on 2 pillows stably. Doesn't sleep well for the last ~10 years, but no more PND. Epworth sleepiness scale=3, risk factors of hypertension, insomnia, stress, nocturia, dry mouth after waking, excessive daytime sleepiness, history of diastolic heart failure, diabetes, obesity, concentration issues, awake feeling not rested, wake with headaches. Isn't sure about her snoring. Has never had sleep study  before.  Cares for grandson (age 4), has been struggling with stress and financial issues since being laid off. Worried about her granddaughter and future grandson, pregnancy is not going well. Worries she is depressed, is already referred and has an appointment pending with a psychologist. Does not have any thoughts of hurting herself, says she needs to be around for a long time to take care of her grandson.  Mother passed at age 2 from stomach cancer but had heart issues, father passed at age 19 from lung cancer. 3 brothers, all had prostate cancer but still living. Sister is age 42 and has some type of heart problem.  Past Medical History:  Diagnosis Date  . Anxiety   . Arthritis    "neck, back, hands, knees, hips" (08/18/2015)  . 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 Chronic lower back pain   . Coronary artery disease    2010 MI  . Depression   . Family history of adverse reaction to anesthesia    her mother gets sick  . GERD (gastroesophageal reflux disease)   . Hyperlipidemia   . Hypertension   . Hypothyroidism since 02/2015  . Macular degeneration, bilateral   . Migraine    "q couple weeks recently" (08/18/2015)  . Morbid obesity ([place]) 05/10/2015  . Myocardial infarction ([place]) 2012  . Temporary low platelet count ([place]) 2012   checked every 6 mths...kept her in for 1 week with MI, and she's not sure exactly why    Past Surgical History:  Procedure Laterality Date  . BACK SURGERY    . BREAST BIOPSY Left 08/2015  . BREAST LUMPECTOMY WITH RADIOACTIVE SEED LOCALIZATION Left 11/23/2015   Procedure: LEFT BREAST LUMPECTOMY WITH RADIOACTIVE SEED LOCALIZATION;  Surgeon: Fanny Skates, MD;  Location: [facility];  Service: General;  Laterality: Left;  . CARDIAC CATHETERIZATION  2013  . CHOLECYSTECTOMY OPEN    . CORONARY ANGIOPLASTY WITH STENT PLACEMENT  2013   12/09/11 95% stenosis proximal RCA->DES; otherwise no significant CAD  . EYE SURGERY     cataract right eye    . GASTRIC RESTRICTION SURGERY  1974  . JOINT REPLACEMENT Right    knee  . MAXIMUM ACCESS (MAS)POSTERIOR LUMBAR INTERBODY FUSION (PLIF) 1 LEVEL N/A 08/18/2015   Procedure: Lumbar four-five Maximum access posterior lumbar interbody fusion;  Surgeon: Erline Levine, MD;  Location: [facility];  Service: Neurosurgery;  Laterality: N/A;  L4-5 Maximum access posterior lumbar interbody fusion  . REPLACEMENT TOTAL KNEE Right 2007  . TONSILLECTOMY    . TOTAL HIP ARTHROPLASTY Right 11/01/2016   Procedure: RIGHT TOTAL HIP ARTHROPLASTY ANTERIOR APPROACH;  Surgeon: Mcarthur Rossetti, MD;  Location: [facility];  Service: Orthopedics;  Laterality: Right;  . TOTAL KNEE ARTHROPLASTY Left 02/28/2017   Procedure: LEFT TOTAL KNEE ARTHROPLASTY;  Surgeon: Mcarthur Rossetti, MD;  Location: [facility];  Service: Orthopedics;  Laterality: Left;    Current Medications: Current Outpatient Medications on File Prior to Visit  Medication Sig  . Alcohol Swabs (B-D SINGLE USE SWABS REGULAR) PADS   . aspirin EC 81 MG tablet Take by mouth.  Marland Kitchen atorvastatin (LIPITOR) 40 MG tablet Take 40 mg by mouth daily.   . Blood Glucose Calibration (TRUE METRIX LEVEL 1) Low SOLN   . Blood Glucose Monitoring Suppl (TRUE METRIX AIR GLUCOSE METER) w/Device KIT   . cholecalciferol (VITAMIN D) 1000 UNITS tablet Take 1,000 Units by mouth daily.  . DULoxetine (CYMBALTA) 60 MG capsule Take 60 mg by mouth 2 (two) times daily.  . furosemide (LASIX) 20 MG tablet Take 1 tablet (20 mg total) by mouth daily.  Marland Kitchen levothyroxine (SYNTHROID, LEVOTHROID) 50 MCG tablet Take 50 mcg by mouth daily before breakfast.   . meloxicam (MOBIC) 15 MG tablet Take 1 tablet (15 mg total) by mouth daily as needed for pain.  . metoprolol tartrate (LOPRESSOR) 25 MG tablet Take 25 mg by mouth 2 (two) times daily.   . nitroGLYCERIN (NITROSTAT) 0.4 MG SL tablet Place 0.4 mg under the tongue every 5 (five) minutes as needed for chest pain. Reported on 10/09/2015  . omeprazole  (PRILOSEC) 20 MG capsule Take 20 mg by mouth daily before breakfast.   . potassium chloride SA (K-DUR,KLOR-CON) 10 MEQ tablet Take 1 tablet (10 mEq total) by mouth daily.  Marland Kitchen tiZANidine (ZANAFLEX) 4 MG tablet Take 1 tablet (4 mg total) by mouth every 8 (eight) hours as needed for muscle spasms.  Marland Kitchen topiramate (TOPAMAX) 50 MG tablet Take 50 mg by mouth at bedtime.  . traZODone (DESYREL) 100 MG tablet Take 100 mg by mouth at bedtime.  . triamcinolone cream (KENALOG) 0.1 %   . TRUE METRIX BLOOD GLUCOSE TEST test strip   . TRUEPLUS LANCETS 33G MISC    No current facility-administered medications on file prior to visit.      Allergies:   Sulfa antibiotics   Social History   Socioeconomic History  . Marital status: Divorced    Spouse name: Not on file  . Number of children: Not on file  .  Years of education: Not on file  . Highest education level: Not on file  Occupational History  . Not on file  Social Needs  . Financial resource strain: Not on file  . Food insecurity:    Worry: Not on file    Inability: Not on file  . Transportation needs:    Medical: Not on file    Non-medical: Not on file  Tobacco Use  . Smoking status: Never Smoker  . Smokeless tobacco: Never Used  Substance and Sexual Activity  . Alcohol use: No    Alcohol/week: 0.0 oz  . Drug use: No  . Sexual activity: Never  Lifestyle  . Physical activity:    Days per week: Not on file    Minutes per session: Not on file  . Stress: Not on file  Relationships  . Social connections:    Talks on phone: Not on file    Gets together: Not on file    Attends religious service: Not on file    Active member of club or organization: Not on file    Attends meetings of clubs or organizations: Not on file    Relationship status: Not on file  Other Topics Concern  . Not on file  Social History Narrative  . Not on file     Family History: The patient's family history includes Bipolar disorder in her daughter and  daughter; Cancer in her brother; Heart disease in her sister; Stomach cancer in her mother; Thyroid disease in her daughter and grandchild. Mother passed at age 44 from stomach cancer but had heart issues, father passed at age 52 from lung cancer. 3 brothers, all had prostate cancer but still living. Sister is age 1 and has some type of heart problem.  ROS:   Please see the history of present illness.  Additional pertinent ROS: Review of Systems  Constitutional: Positive for malaise/fatigue. Negative for chills, diaphoresis, fever and weight loss.  HENT: Negative for congestion and hearing loss.   Eyes: Negative for double vision and pain.  Respiratory: Positive for shortness of breath. Negative for cough and wheezing.   Cardiovascular: Negative for chest pain, palpitations, orthopnea, claudication, leg swelling and PND.  Gastrointestinal: Positive for diarrhea. Negative for abdominal pain, blood in stool and melena.  Genitourinary: Negative for hematuria.  Musculoskeletal: Positive for back pain. Negative for falls.  Skin:       Chronic discoloration of bilateral lower extremities  Neurological: Negative for dizziness, focal weakness and loss of consciousness.  Endo/Heme/Allergies: Does not bruise/bleed easily.  Psychiatric/Behavioral: Positive for depression. Negative for substance abuse and suicidal ideas.    EKGs/Labs/Other Studies Reviewed:    The following studies were reviewed today: Cath report from [facility] (12/09/2011) Coronary Findings: Dominance and General Appearance Right dominant with Single Vessel CAD involving the RCA Left Main Coronary Artery No significant Left Main lesions were identified Left Anterior Descending Artery No significant Left Anterior Descending Artery lesions were identified. Left Circumflex Artery No significant lesions in Circumflex Artery were identified. Right Coronary Artery Complex 95% proximal lesion in RCA Left to right collaterals  were noted  Comments: Using above hardware and Angiomax for anticoagulation, lesion was crossed with fielder wire (Pro-Water and PT graphix were unable to cross). using over the wire balloon (1.25 mm) lesion in mid RCA was pre dilated with that as well as a 2 mm balloon. Then a 3 x 28 mm DES per study protocol (EVOLVE-2) was deployed by Dr. Ennis Forts. Lesion was post dilated  with 3 and 3.25 mm [place] balloon resulting in 0% residual and TIMI 3 distal flow. All distal branches were accounted for. Conclusion: Diagnostic Results: Single Vessel CAD involving the RCA  Coronary Intervention Results: PCI Indication: Standard PCI Indications Procedural Success: Complete Successful PTCA/ Stenting- RCA to 0 % w/ DES  Vascular Access Management: Pulled, TR band applied to the right wrist  Had stress test here in 05/2015 but was feeling generally well at the time, but was preop for back surgery and couldn't walk. Study Highlights    Nuclear stress EF: 65%.  The left ventricular ejection fraction is normal (55-65%).  No T wave inversion was noted during stress.  There was no ST segment deviation noted during stress.  This is a low risk study.   No reversible perfusion defects. LVEF 65%. Normal wall motion. This is a low risk study.   EKG:  EKG is ordered today.  The ekg ordered today demonstrates normal sinus rhythm with poor R wave progression  Recent Labs: 04/27/2017: B Natriuretic Peptide 209.2; Hemoglobin 10.3; Platelets 215 04/28/2017: BUN 10; Creatinine, Ser 0.82; Potassium 3.3; Sodium 139  Recent Lipid Panel No results found for: CHOL, TRIG, HDL, CHOLHDL, VLDL, LDLCALC, LDLDIRECT  Physical Exam:    VS:  BP 132/81 (BP Location: Left Arm, Patient Position: Sitting, Cuff Size: Normal)   Pulse 81   Ht _0  (1.626 m)   Wt 265 lb (120.2 kg)   BMI 45.49 kg/m     Wt Readings from Last 3 Encounters:  03/12/18 265 lb (120.2 kg)  04/28/17 281 lb 14.4 oz (127.9 kg)  02/28/17 273 lb  (123.8 kg)     GEN: Well nourished, well developed in no acute distress HEENT: Normal NECK: No JVD; No carotid bruits LYMPHATICS: No lymphadenopathy CARDIAC: regular rhythm, normal S1 and S2, no murmurs, rubs, gallops. 2+ bilateral radial and DP pulses. RESPIRATORY:  Clear to auscultation without rales, wheezing or rhonchi  ABDOMEN: Soft, non-tender, non-distended MUSCULOSKELETAL:  No edema; No deformity  SKIN: Warm and dry. Chronic purplish discoloration of bilateral lower extremities with many small veins in skin visible. Warm, strong pulses, no pain. NEUROLOGIC:  Alert and oriented x 3 PSYCHIATRIC:  Normal affect. Does endorse depression, denies suicidal ideation.  ASSESSMENT:    1. SOB (shortness of breath)   2. Angina pectoris ([place])   3. Sleep apnea, unspecified type   4. Coronary artery disease involving native coronary artery of native heart with other form of angina pectoris ([place])   5. Essential hypertension   6. Type 2 diabetes mellitus without complication, without long-term current use of insulin (HCC)   7. Morbid obesity ([place])   8. Depression, unspecified depression type   9. Hyperlipidemia, unspecified hyperlipidemia type    PLAN:    In order of problems listed above:  1. Shortness of breath, anginal equivalent, history of CAD with RCA stent in 2013, hyperlipidemia: -never had typical chest pain with her symptoms. Did have a normal nuclear study several years ago but was not having symptoms at that time. Given risk, will proceed with stress test. She thinks she could exercise, and it would be helpful to know what her threshold is in terms of exercise induced symptoms. Will proceed with a treadmill nuclear study. If needed it could be converted to [place]. Will be a 2 day study. -LDL is 59, well controlled on statin -on aspirin 81 mg  2. Concern for sleep apnea -Epworth sleep scale (see HPI), and risk factors/symptoms concerning for  sleep apnea. Has never been  tested. We will proceed with ordering her a sleep study to determine if she would benefit from CPAP.  3. Hypertension -just at goal today. Given her diabetes, would recommend ACEI instead of metoprolol. If she tolerates ACEI or ARB she may not need potassium supplementation. Will follow up stress test first and then based on results re-evaluate medication needs.  4. Type II diabetes -managed by diet, followed primary care.  5. Morbid obesity -we discussed lifestyle intervention. Given her symptoms, will evaluate with stress test first. If stress test ok, will discuss options for exercise and review diet  6. Depression -endorsed several times today. Did have discussion with her, she is on duloxetine for pain but is also an antidepressant. Has an appointment with a psychologist. Denies suicidal ideation. Encouraged her to continue to address and reach out if she needs help.  We will follow up in three months to further assess her symptoms and risk factors.   Medication Adjustments/Labs and Tests Ordered: Current medicines are reviewed at length with the patient today.  Concerns regarding medicines are outlined above.  Orders Placed This Encounter  Procedures  . MYOCARDIAL PERFUSION IMAGING  . EKG 12-Lead  . Split night study   No orders of the defined types were placed in this encounter.   Patient Instructions  Medication Instructions: Your physician recommends that you continue on your current medications as directed.    If you need a refill on your cardiac medications before your next appointment, please call your pharmacy.   Labwork: None  Procedures/Testing: Your physician has recommended that you have a sleep study. This test records several body functions during sleep, including: brain activity, eye movement, oxygen and carbon dioxide blood levels, heart rate and rhythm, breathing rate and rhythm, the flow of air through your mouth and nose, snoring, body muscle movements,  and chest and belly movement. [place] physician has requested that you have en exercise stress myoview. For further information please visit [email or web address]. Please follow instruction sheet, as given. Hold Metoprolol morning of test. [address]    Follow-Up: Your physician wants you to follow-up in 3 months with Dr. Harrell Gave  Special Instructions:    Thank you for choosing [facility]care at [facility]!!       Signed, Buford Dresser, MD PhD 03/12/2018 12:13 PM    [place]

## 2018-03-12 ENCOUNTER — Encounter: Payer: Self-pay | Admitting: Cardiology

## 2018-03-12 ENCOUNTER — Ambulatory Visit: Payer: Medicare HMO | Admitting: Cardiology

## 2018-03-12 ENCOUNTER — Telehealth: Payer: Self-pay | Admitting: *Deleted

## 2018-03-12 VITALS — BP 132/81 | HR 81 | Ht 64.0 in | Wt 265.0 lb

## 2018-03-12 DIAGNOSIS — I25118 Atherosclerotic heart disease of native coronary artery with other forms of angina pectoris: Secondary | ICD-10-CM | POA: Diagnosis not present

## 2018-03-12 DIAGNOSIS — E785 Hyperlipidemia, unspecified: Secondary | ICD-10-CM

## 2018-03-12 DIAGNOSIS — I209 Angina pectoris, unspecified: Secondary | ICD-10-CM

## 2018-03-12 DIAGNOSIS — F329 Major depressive disorder, single episode, unspecified: Secondary | ICD-10-CM | POA: Diagnosis not present

## 2018-03-12 DIAGNOSIS — E119 Type 2 diabetes mellitus without complications: Secondary | ICD-10-CM

## 2018-03-12 DIAGNOSIS — G4730 Sleep apnea, unspecified: Secondary | ICD-10-CM | POA: Diagnosis not present

## 2018-03-12 DIAGNOSIS — F32A Depression, unspecified: Secondary | ICD-10-CM

## 2018-03-12 DIAGNOSIS — I10 Essential (primary) hypertension: Secondary | ICD-10-CM | POA: Diagnosis not present

## 2018-03-12 DIAGNOSIS — R0602 Shortness of breath: Secondary | ICD-10-CM

## 2018-03-12 NOTE — Patient Instructions (Addendum)
Medication Instructions: Your physician recommends that you continue on your current medications as directed.    If you need a refill on your cardiac medications before your next appointment, please call your pharmacy.   Labwork: None  Procedures/Testing: Your physician has recommended that you have a sleep study. This test records several body functions during sleep, including: brain activity, eye movement, oxygen and carbon dioxide blood levels, heart rate and rhythm, breathing rate and rhythm, the flow of air through your mouth and nose, snoring, body muscle movements, and chest and belly movement. [place] physician has requested that you have en exercise stress myoview. For further information please visit [email or web address]. Please follow instruction sheet, as given. Hold Metoprolol morning of test. [address]    Follow-Up: Your physician wants you to follow-up in 3 months with Dr. Harrell Gave  Special Instructions:    Thank you for choosing Heart[facility] at [facility]!!

## 2018-03-12 NOTE — Telephone Encounter (Signed)
-----   Message from Meryl Crutch, RN sent at 03/12/2018  9:44 AM EDT ----- Pt needs to be scheduled for sleep study. Thanks

## 2018-03-12 NOTE — Telephone Encounter (Signed)
PA submitted to [employer] for in lab sleep study. °

## 2018-03-16 ENCOUNTER — Telehealth: Payer: Self-pay | Admitting: *Deleted

## 2018-03-16 NOTE — Telephone Encounter (Signed)
Patient returned a call to me and was given sleep study appointment details. 

## 2018-03-16 NOTE — Telephone Encounter (Signed)
Left message to return a call to give sleep study appointment information.

## 2018-03-16 NOTE — Telephone Encounter (Signed)
-----   Message from Meryl Crutch, RN sent at 03/12/2018  9:44 AM EDT ----- Pt needs to be scheduled for sleep study. Thanks

## 2018-03-23 ENCOUNTER — Telehealth: Payer: Self-pay | Admitting: Cardiology

## 2018-03-23 NOTE — Telephone Encounter (Signed)
New Message   Patient is calling in reference to the stress test that she has scheduled. She states that she saw her PCP and the PCP gave her some medication to take. Now she is feeling much better and does not think that she needs to have the stress test. Before she cancels she wants to be sure that Dr. Harrell Gave agrees. Please call to discuss

## 2018-03-23 NOTE — Telephone Encounter (Signed)
Spoke with pt who states she was at a friend's birthday party last week and ended up flipping out. She report she called her pcp requesting that she provide her with something to help with her anxiety. Pt states she was started on Hydroxyzine 25 mg and every sincehas not been having any chest pain or SOB. She was calling to inquire if she still needs to have stress test since her symptoms has resolved. Routing to Dr. Harrell Gave for recommendation.

## 2018-03-24 NOTE — Telephone Encounter (Signed)
Spoke with pt and advised that per Dr. Harrell Gave she is ok to cancel test but if symptoms reoccur, to contact our office. Verbalized understanding.

## 2018-03-24 NOTE — Telephone Encounter (Signed)
If the patient is feeling better, it is up to her if she wishes to pursue stress test at this time. We have follow up scheduled in 3 mos if she would like to wait. If at any point the chest pain recurs, she should call us so that we can readdress. Thank you.

## 2018-03-26 ENCOUNTER — Inpatient Hospital Stay (HOSPITAL_COMMUNITY): Admission: RE | Admit: 2018-03-26 | Payer: Medicare HMO | Source: Ambulatory Visit

## 2018-03-27 ENCOUNTER — Inpatient Hospital Stay (HOSPITAL_COMMUNITY): Admission: RE | Admit: 2018-03-27 | Payer: Medicare HMO | Source: Ambulatory Visit

## 2018-04-12 ENCOUNTER — Ambulatory Visit (HOSPITAL_BASED_OUTPATIENT_CLINIC_OR_DEPARTMENT_OTHER): Payer: Medicare HMO | Attending: Cardiology | Admitting: Cardiovascular Disease

## 2018-04-12 VITALS — Ht 64.0 in | Wt 262.0 lb

## 2018-04-12 DIAGNOSIS — G4737 Central sleep apnea in conditions classified elsewhere: Secondary | ICD-10-CM | POA: Insufficient documentation

## 2018-04-12 DIAGNOSIS — G4733 Obstructive sleep apnea (adult) (pediatric): Secondary | ICD-10-CM | POA: Diagnosis not present

## 2018-04-12 DIAGNOSIS — G4730 Sleep apnea, unspecified: Secondary | ICD-10-CM | POA: Diagnosis not present

## 2018-04-16 DIAGNOSIS — M549 Dorsalgia, unspecified: Secondary | ICD-10-CM | POA: Diagnosis not present

## 2018-04-24 ENCOUNTER — Encounter (HOSPITAL_BASED_OUTPATIENT_CLINIC_OR_DEPARTMENT_OTHER): Payer: Self-pay | Admitting: Cardiovascular Disease

## 2018-04-24 NOTE — Procedures (Signed)
Patient Name: Dawn Davidson, Widjaja Date: 04/12/2018 Gender: Female D.O.B: Jan 17, 1951 Age (years): 67 Referring Provider: Buford Dresser Height (inches): 64 Interpreting Physician: Shelva Majestic MD, ABSM Weight (lbs): 262 RPSGT: Baxter Flattery BMI: 45 MRN: [id number] Neck Size: 16.50  CLINICAL INFORMATION Sleep Study Type: NPSG  Indication for sleep study: Fatigue, Morning Headaches, Obesity, Snoring, Witnesses Apnea / Gasping During Sleep  Epworth Sleepiness Score: 10  SLEEP STUDY TECHNIQUE As per the AASM Manual for the Scoring of Sleep and Associated Events v2.3 (April 2016) with a hypopnea requiring 4% desaturations.  The channels recorded and monitored were frontal, central and occipital EEG, electrooculogram (EOG), submentalis EMG (chin), nasal and oral airflow, thoracic and abdominal wall motion, anterior tibialis EMG, snore microphone, electrocardiogram, and pulse oximetry.  MEDICATIONS     Alcohol Swabs (B-D SINGLE USE SWABS REGULAR) PADS             aspirin EC 81 MG tablet         atorvastatin (LIPITOR) 40 MG tablet         Blood Glucose Calibration (TRUE METRIX LEVEL 1) Low SOLN         Blood Glucose Monitoring Suppl (TRUE METRIX AIR GLUCOSE METER) w/Device KIT         cholecalciferol (VITAMIN D) 1000 UNITS tablet         DULoxetine (CYMBALTA) 60 MG capsule         furosemide (LASIX) 20 MG tablet         levothyroxine (SYNTHROID, LEVOTHROID) 50 MCG tablet         meloxicam (MOBIC) 15 MG tablet         metoprolol tartrate (LOPRESSOR) 25 MG tablet         nitroGLYCERIN (NITROSTAT) 0.4 MG SL tablet         omeprazole (PRILOSEC) 20 MG capsule         potassium chloride SA (K-DUR,KLOR-CON) 10 MEQ tablet         tiZANidine (ZANAFLEX) 4 MG tablet         topiramate (TOPAMAX) 50 MG tablet         traZODone (DESYREL) 100 MG tablet         triamcinolone cream (KENALOG) 0.1 %         TRUE METRIX BLOOD GLUCOSE TEST test strip         TRUEPLUS LANCETS 33G MISC       Medications self-administered by patient taken the night of the study : METOPROLOL, OXYCODONE HCL, TOPIRAMATE, TRAZODONE  SLEEP ARCHITECTURE The study was initiated at 10:20:08 PM and ended at 4:47:30 AM.  Sleep onset time was 11.3 minutes and the sleep efficiency was 71.5%. The total sleep time was 277 minutes.  Stage REM latency was N/A minutes.  The patient spent 23.11% of the night in stage N1 sleep, 76.89% in stage N2 sleep, 0.00% in stage N3 and 0% in REM.  Alpha intrusion was absent.  Supine sleep was 0.00%.  RESPIRATORY PARAMETERS The overall apnea/hypopnea index (AHI) was 27.1 per hour. The respiratory disturbance index (RDI) was 39.9/h.  There were 109 total apneas, including 71 obstructive, 35 central and 3 mixed apneas. There were 16 hypopneas and 59 RERAs.  The AHI during Stage REM sleep was N/A per hour.  AHI while supine was N/A per hour.  The mean oxygen saturation was 93.23%. The minimum SpO2 during sleep was 88.00%.  Loud snoring was noted during this study.  CARDIAC DATA The 2 lead  EKG demonstrated sinus rhythm. The mean heart rate was 66.05 beats per minute. Other EKG findings include: None.  LEG MOVEMENT DATA The total PLMS were 1 with a resulting PLMS index of 0.22. Associated arousal with leg movement index was 0.0 .  IMPRESSIONS - Moderate obstructive sleep apnea  (AHI 27.1/h; RDI 39.9/h). However, both REM sleep and supine sleep were absent on the present study and the ovetrall severity may be underestimated.  - Mild central sleep apnea occurred during this study (CAI 7.6/h). - Mild oxygen desaturation to a nadir of 88%. - The patient snored with loud snoring volume. - No cardiac abnormalities were noted during this study. - Clinically significant periodic limb movements did not occur during sleep. No significant associated arousals.  DIAGNOSIS - Obstructive Sleep Apnea (327.23 [G47.33 ICD-10]) - Central Sleep Apnea (327.27 [G47.37  ICD-10])  RECOMMENDATIONS - CPAP titration to determine optimal pressure required to alleviate sleep disordered breathing. BiPAP or ASV titration may be required to eliminate central sleep apnea. - Effort should be made to optimize nasal and oropharyngeal patency. - Avoid alcohol, sedatives and other CNS depressants that may worsen sleep apnea and disrupt normal sleep architecture. - Sleep hygiene should be reviewed to assess factors that may improve sleep quality. - Weight management (BMI 45) and regular exercise should be initiated or continued if appropriate.  [Electronically signed] 04/24/2018 02:27 PM  Shelva Majestic MD, [facility], [place], American Board of Sleep Medicine   NPI: [id number] [place] PH: [phone]   FX: [phone] [place]

## 2018-04-29 ENCOUNTER — Other Ambulatory Visit: Payer: Self-pay | Admitting: Cardiovascular Disease

## 2018-04-29 ENCOUNTER — Telehealth: Payer: Self-pay | Admitting: *Deleted

## 2018-04-29 DIAGNOSIS — G4733 Obstructive sleep apnea (adult) (pediatric): Secondary | ICD-10-CM

## 2018-04-29 DIAGNOSIS — I25118 Atherosclerotic heart disease of native coronary artery with other forms of angina pectoris: Secondary | ICD-10-CM

## 2018-04-29 DIAGNOSIS — I10 Essential (primary) hypertension: Secondary | ICD-10-CM

## 2018-04-29 NOTE — Telephone Encounter (Signed)
-----   Message from Troy Sine, MD sent at 04/24/2018  2:32 PM EDT ----- Mariann Laster, please notify pt and set up for CPAP titration.

## 2018-04-29 NOTE — Telephone Encounter (Signed)
Called patient to discuss sleep study results. Was told patient not currently available. Call back in about 2 hours.

## 2018-05-05 ENCOUNTER — Telehealth: Payer: Self-pay | Admitting: *Deleted

## 2018-05-05 NOTE — Telephone Encounter (Signed)
Patient notified of sleep study results and recommendations. She has no questions for me att his time.

## 2018-05-05 NOTE — Telephone Encounter (Signed)
-----   Message from Troy Sine, MD sent at 04/24/2018  2:32 PM EDT ----- Mariann Laster, please notify pt and set up for CPAP titration.

## 2018-05-05 NOTE — Telephone Encounter (Signed)
Patient notified of CPAP titration appointment scheduled on 06/02/18.

## 2018-05-05 NOTE — Telephone Encounter (Signed)
-----   Message from Lauralee Evener, CMA sent at 04/29/2018 10:51 AM EDT ----- CPAP titration.

## 2018-06-02 ENCOUNTER — Encounter (HOSPITAL_BASED_OUTPATIENT_CLINIC_OR_DEPARTMENT_OTHER): Payer: Medicare HMO

## 2018-06-21 ENCOUNTER — Ambulatory Visit (HOSPITAL_BASED_OUTPATIENT_CLINIC_OR_DEPARTMENT_OTHER): Payer: Medicare HMO | Attending: Cardiovascular Disease | Admitting: Cardiovascular Disease

## 2018-06-21 VITALS — Ht 64.0 in | Wt 262.0 lb

## 2018-06-21 DIAGNOSIS — I25118 Atherosclerotic heart disease of native coronary artery with other forms of angina pectoris: Secondary | ICD-10-CM | POA: Diagnosis not present

## 2018-06-21 DIAGNOSIS — I10 Essential (primary) hypertension: Secondary | ICD-10-CM | POA: Diagnosis not present

## 2018-06-21 DIAGNOSIS — G4731 Primary central sleep apnea: Secondary | ICD-10-CM | POA: Diagnosis not present

## 2018-06-21 DIAGNOSIS — G4733 Obstructive sleep apnea (adult) (pediatric): Secondary | ICD-10-CM | POA: Diagnosis not present

## 2018-06-23 ENCOUNTER — Ambulatory Visit: Payer: Medicare HMO | Admitting: Cardiology

## 2018-06-23 DIAGNOSIS — M549 Dorsalgia, unspecified: Secondary | ICD-10-CM | POA: Diagnosis not present

## 2018-06-23 DIAGNOSIS — Z23 Encounter for immunization: Secondary | ICD-10-CM | POA: Diagnosis not present

## 2018-06-23 DIAGNOSIS — N183 Chronic kidney disease, stage 3 (moderate): Secondary | ICD-10-CM | POA: Diagnosis not present

## 2018-06-23 DIAGNOSIS — I25119 Atherosclerotic heart disease of native coronary artery with unspecified angina pectoris: Secondary | ICD-10-CM | POA: Diagnosis not present

## 2018-06-23 DIAGNOSIS — K582 Mixed irritable bowel syndrome: Secondary | ICD-10-CM | POA: Diagnosis not present

## 2018-06-23 DIAGNOSIS — F322 Major depressive disorder, single episode, severe without psychotic features: Secondary | ICD-10-CM | POA: Diagnosis not present

## 2018-06-23 DIAGNOSIS — Z6841 Body Mass Index (BMI) 40.0 and over, adult: Secondary | ICD-10-CM | POA: Diagnosis not present

## 2018-06-23 DIAGNOSIS — E1122 Type 2 diabetes mellitus with diabetic chronic kidney disease: Secondary | ICD-10-CM | POA: Diagnosis not present

## 2018-06-24 ENCOUNTER — Ambulatory Visit (HOSPITAL_COMMUNITY): Payer: Medicare HMO | Admitting: Psychiatry

## 2018-07-06 ENCOUNTER — Encounter (HOSPITAL_BASED_OUTPATIENT_CLINIC_OR_DEPARTMENT_OTHER): Payer: Self-pay | Admitting: Cardiovascular Disease

## 2018-07-06 NOTE — Procedures (Signed)
Patient Name: Dawn Davidson, Dawn Davidson Date: 06/21/2018 Gender: Female D.O.B: 12-Mar-1951 Age (years): 67 Referring Provider: Buford Dresser Height (inches): 64 Interpreting Physician: Shelva Majestic MD, ABSM Weight (lbs): 262 RPSGT: Baxter Flattery BMI: 68 MRN: [id number] Neck Size: 16.50  CLINICAL INFORMATION The patient is referred for a split night study with BPAP. Most recent polysomnogram dated 04/12/2018 revealed an AHI of 27.1/h and RDI of 39.9/h. Most recent titration study was on 06/21/2018.  MEDICATIONS     Alcohol Swabs (B-D SINGLE USE SWABS REGULAR) PADS             aspirin EC 81 MG tablet         atorvastatin (LIPITOR) 40 MG tablet         Blood Glucose Calibration (TRUE METRIX LEVEL 1) Low SOLN         Blood Glucose Monitoring Suppl (TRUE METRIX AIR GLUCOSE METER) w/Device KIT         cholecalciferol (VITAMIN D) 1000 UNITS tablet         DULoxetine (CYMBALTA) 60 MG capsule         furosemide (LASIX) 20 MG tablet         levothyroxine (SYNTHROID, LEVOTHROID) 50 MCG tablet         meloxicam (MOBIC) 15 MG tablet         metoprolol tartrate (LOPRESSOR) 25 MG tablet         nitroGLYCERIN (NITROSTAT) 0.4 MG SL tablet         omeprazole (PRILOSEC) 20 MG capsule         potassium chloride SA (K-DUR,KLOR-CON) 10 MEQ tablet         tiZANidine (ZANAFLEX) 4 MG tablet         topiramate (TOPAMAX) 50 MG tablet         traZODone (DESYREL) 100 MG tablet         triamcinolone cream (KENALOG) 0.1 %         TRUE METRIX BLOOD GLUCOSE TEST test strip         TRUEPLUS LANCETS 33G MISC      Medications self-administered by patient taken the night of the study : METOPROLOL, OXYCODONE HCL, TOPIRAMATE, TRAZODONE  SLEEP STUDY TECHNIQUE As per the AASM Manual for the Scoring of Sleep and Associated Events v2.3 (April 2016) with a hypopnea requiring 4% desaturations.  The channels recorded and monitored were frontal, central and occipital EEG, electrooculogram (EOG), submentalis  EMG (chin), nasal and oral airflow, thoracic and abdominal wall motion, anterior tibialis EMG, snore microphone, electrocardiogram, and pulse oximetry. Bi-level positive airway pressure (BiPAP) was initiated when the patient met split night criteria and was titrated according to treat sleep-disordered breathing.  RESPIRATORY PARAMETERS Diagnostic Total AHI (/hr): 30.8 RDI (/hr): 30.8 OA Index (/hr): - CA Index (/hr): 18.9 REM AHI (/hr): 7.6 NREM AHI (/hr): 36.7 Supine AHI (/hr): N/A Non-supine AHI (/hr): 30.77 Min O2 Sat (%): 87.0 Mean O2 (%): 93.3 Time below 88% (min): 0.4   Titration Optimal IPAP Pressure (cm): 16 Optimal EPAP Pressure (cm): 12 AHI at Optimal Pressure (/hr): 0.0 Min O2 at Optimal Pressure (%): 88.0 Sleep % at Optimal (%): 80 Supine % at Optimal (%): 0    SLEEP ARCHITECTURE The study was initiated at 11:09:03 PM and terminated at 5:35:39 AM. The total recorded time was 386.6 minutes. EEG confirmed total sleep time was 273 minutes yielding a sleep efficiency of 70.6%%. Sleep onset after lights out was 23.4 minutes with a REM  latency of 249.0 minutes. The patient spent 6.2%% of the night in stage N1 sleep, 73.4%% in stage N2 sleep, 0.0%% in stage N3 and 20.3% in REM. Wake after sleep onset (WASO) was 90.2 minutes. The Arousal Index was 6.6/hour.  LEG MOVEMENT DATA The total Periodic Limb Movements of Sleep (PLMS) were 0. The PLMS index was 0.0 .  CARDIAC DATA The 2 lead EKG demonstrated sinus rhythm. The mean heart rate was 100.0 beats per minute. Other EKG findings include: None.  IMPRESSIONS - Severe obstructive sleep apnea occurred during the diagnostic portion of the study (AHI 30.8 /hour). CPAP was initiated at 5 and was titrated to 11 cm; BiPAP was initiated at 12/8 and was to 16/12 pressure.  AHI at 16/12 was 0 with O2 nadir at 88%.  - Moderate central sleep apnea occurred during the diagnostic portion of the study (CAI = 18.9/hour). - No snoring was audible during  this study. - No cardiac abnormalities were noted during this study. - Clinically significant periodic limb movements of sleep did not occur during the study.  DIAGNOSIS - Obstructive Sleep Apnea (327.23 [G47.33 ICD-10]) - Central sleep apnea  RECOMMENDATIONS - Recommend an initial trial of BiPAP therapy at16/12 cm H2O with heated humidification. A Medium size Fisher[employer]Paykel Full Face Mask Simplus mask was used for the titration. - Effort should be made to optimize nasal and oropharyngeal patnecy. - Avoid alcohol, sedatives and other CNS depressants that may worsen sleep apnea and disrupt normal sleep architecture. - Sleep hygiene should be reviewed to assess factors that may improve sleep quality. - Weight management and regular exercise should be initiated or continued. - Recommend a download be obtained in 30 days and sleep clinic evaluation.  [Electronically signed] 07/06/2018 06:54 PM  Shelva Majestic MD, [facility], ABSM Diplomate, American Board of Sleep Medicine   NPI: [id number]  [place] PH: [phone]   FX: [phone] [place]

## 2018-07-07 ENCOUNTER — Telehealth: Payer: Self-pay | Admitting: *Deleted

## 2018-07-07 NOTE — Telephone Encounter (Signed)
Patient informed of titration results and recommendations. Order for BIPAP sent to [facility].

## 2018-07-07 NOTE — Telephone Encounter (Signed)
-----   Message from Troy Sine, MD sent at 07/06/2018  6:58 PM EST ----- Mariann Laster Please notify pt and set up with DME for BiPAP

## 2018-07-09 DIAGNOSIS — H5212 Myopia, left eye: Secondary | ICD-10-CM | POA: Diagnosis not present

## 2018-07-24 ENCOUNTER — Ambulatory Visit: Payer: Medicare HMO | Admitting: Cardiology

## 2018-07-28 DIAGNOSIS — G4733 Obstructive sleep apnea (adult) (pediatric): Secondary | ICD-10-CM | POA: Diagnosis not present

## 2018-08-06 ENCOUNTER — Encounter: Payer: Self-pay | Admitting: Physician Assistant

## 2018-08-06 ENCOUNTER — Telehealth: Payer: Self-pay | Admitting: Physician Assistant

## 2018-08-06 ENCOUNTER — Ambulatory Visit (INDEPENDENT_AMBULATORY_CARE_PROVIDER_SITE_OTHER): Payer: Medicare HMO | Admitting: Physician Assistant

## 2018-08-06 NOTE — Telephone Encounter (Signed)
Roomed patient PHQ9 was done,denied suicidal thoughts.

## 2018-08-06 NOTE — Telephone Encounter (Signed)
I agree with plan.  Appreciate coordination of care with front desk staff.  I also reviewed patient's PHQ 9, patient appears to not be actively suicidal.

## 2018-08-06 NOTE — Telephone Encounter (Signed)
I was notified by Inda Coke, PA-C that the patient was here thinking that she was to be seen for a behavioral health appointment and wanted to stay with her [place] for primary care. I went into the room and introduced myself and inquired about the appointment. The patient stated that she thinks that there was a scheduling error. I was able to call Behavioral Health and speak to [facility] who advised to give the patient the phone number to call and they would get her scheduled. Patient was appreciative. I cancelled the appointment and gave her the business card for [employer].

## 2018-08-07 NOTE — Progress Notes (Signed)
Error -- I did not see patient.

## 2018-08-12 ENCOUNTER — Telehealth: Payer: Self-pay | Admitting: *Deleted

## 2018-08-12 NOTE — Telephone Encounter (Signed)
Called patient to schedule her a CPAP compliance appointment with Dr Claiborne Billings. She states that she has not started it yet, however she plans to start tonight.she asks if Dr Claiborne Billings is also a cardiologist. I told her that he is. She wants requests to cancel her next appointment with Dr Harrell Gave and change her cardiology care to him so that she will have just one doctor. I told her that protocol requires for this request to go through the physicians before it can be done. I also mentioned to her that she is scheduled to see Dr Harrell Gave in January.  She is not due to see Dr Claiborne Billings until March and I don't know if Dr Judeth Cornfield follow up appointment can wait until then.this will need to be deferred to the physician. Patient voiced her understanding of what was told to her. Phone message will be routed to both doctors for review and approval. If transfer approved, schedulers will need to be notified.

## 2018-08-13 NOTE — Telephone Encounter (Signed)
I am happy to do whatever is easier for the patient. If she is not having worsening symptoms and feels that she can wait until march to be seen by Dr. Claiborne Billings, that is fine by me. If she would like to keep her January appointment and then switch to Dr. Claiborne Billings, that is fine by me too. Thank you very much.

## 2018-08-13 NOTE — Telephone Encounter (Signed)
I am happy to do whatever Dr. Harrell Gave and the patient prefer.

## 2018-08-14 DIAGNOSIS — Z961 Presence of intraocular lens: Secondary | ICD-10-CM | POA: Diagnosis not present

## 2018-08-14 DIAGNOSIS — H2512 Age-related nuclear cataract, left eye: Secondary | ICD-10-CM | POA: Diagnosis not present

## 2018-08-14 DIAGNOSIS — H02831 Dermatochalasis of right upper eyelid: Secondary | ICD-10-CM | POA: Diagnosis not present

## 2018-08-14 NOTE — Telephone Encounter (Signed)
Called patient and informed her per both Dr Claiborne Billings and Dr Harrell Gave ok for her to switch her care. Informed her per Dr Harrell Gave as long as her symptoms have been stable it's ok to wait until March, or she can see her in January and switch care to Dr Claiborne Billings afterwards. Patient chose to wait until March. Stating she feels fine. Appointment changed per patient request to see Dr Claiborne Billings in March 2020.

## 2018-08-21 DIAGNOSIS — H52222 Regular astigmatism, left eye: Secondary | ICD-10-CM | POA: Diagnosis not present

## 2018-08-21 DIAGNOSIS — H5202 Hypermetropia, left eye: Secondary | ICD-10-CM | POA: Diagnosis not present

## 2018-08-21 DIAGNOSIS — Z961 Presence of intraocular lens: Secondary | ICD-10-CM | POA: Diagnosis not present

## 2018-08-21 DIAGNOSIS — H2512 Age-related nuclear cataract, left eye: Secondary | ICD-10-CM | POA: Diagnosis not present

## 2018-08-21 DIAGNOSIS — H25812 Combined forms of age-related cataract, left eye: Secondary | ICD-10-CM | POA: Diagnosis not present

## 2018-08-21 DIAGNOSIS — H25813 Combined forms of age-related cataract, bilateral: Secondary | ICD-10-CM | POA: Diagnosis not present

## 2018-08-27 DIAGNOSIS — G4733 Obstructive sleep apnea (adult) (pediatric): Secondary | ICD-10-CM | POA: Diagnosis not present

## 2018-09-08 ENCOUNTER — Ambulatory Visit: Payer: Medicare HMO | Admitting: Cardiology

## 2018-09-11 ENCOUNTER — Telehealth: Payer: Self-pay | Admitting: *Deleted

## 2018-09-11 ENCOUNTER — Ambulatory Visit: Payer: Medicare HMO | Admitting: Psychology

## 2018-09-11 NOTE — Telephone Encounter (Signed)
Received a call from the patient stating that she needs a new BIPAP mask. She states that she is having problems with her current mask. I told her that she needs to speak with the RT @ [facility] to see if they can assist her. She informs me that she called there and was told to contact us for a order. I explained to her they need to assess her problem then tell me what she needs. Informed her that I will call [facility] and have them to do a mask assessment. I then called Linzie Collin at [facility] and he states that he will contact the patient to have her to come in this afternoon.

## 2018-09-27 ENCOUNTER — Encounter: Payer: Self-pay | Admitting: Cardiovascular Disease

## 2018-09-27 DIAGNOSIS — G4733 Obstructive sleep apnea (adult) (pediatric): Secondary | ICD-10-CM | POA: Diagnosis not present

## 2018-10-06 DIAGNOSIS — G4733 Obstructive sleep apnea (adult) (pediatric): Secondary | ICD-10-CM | POA: Diagnosis not present

## 2018-10-12 ENCOUNTER — Ambulatory Visit: Payer: Medicare HMO | Admitting: Psychology

## 2018-10-15 DIAGNOSIS — I25110 Atherosclerotic heart disease of native coronary artery with unstable angina pectoris: Secondary | ICD-10-CM | POA: Diagnosis not present

## 2018-10-15 DIAGNOSIS — R238 Other skin changes: Secondary | ICD-10-CM | POA: Diagnosis not present

## 2018-10-27 DIAGNOSIS — L84 Corns and callosities: Secondary | ICD-10-CM | POA: Diagnosis not present

## 2018-10-27 DIAGNOSIS — L921 Necrobiosis lipoidica, not elsewhere classified: Secondary | ICD-10-CM | POA: Diagnosis not present

## 2018-10-28 DIAGNOSIS — G4733 Obstructive sleep apnea (adult) (pediatric): Secondary | ICD-10-CM | POA: Diagnosis not present

## 2018-11-04 ENCOUNTER — Telehealth: Payer: Self-pay | Admitting: *Deleted

## 2018-11-04 NOTE — Telephone Encounter (Signed)
Returned a call to patient after speaking with Gilda @ [facility] to get insight on what the patient may be talking about. She states the patient needs to have her compliance visit or they will need to pick up the machine. I informed her the patient has appointment scheduled to see Dr Claiborne Billings on 11/12/18. She states this will be fine. Patient notified of conversation with Bethanne Ginger

## 2018-11-04 NOTE — Telephone Encounter (Signed)
-----   Message from Rivka Barbara sent at 11/04/2018 12:08 PM EST ----- Regarding: Sleep Study Patient states that she received a letter from [facility] advising that she would need to have another sleep study. Please call to discuss.   [phone]

## 2018-11-12 ENCOUNTER — Telehealth: Payer: Self-pay | Admitting: *Deleted

## 2018-11-12 ENCOUNTER — Other Ambulatory Visit: Payer: Self-pay

## 2018-11-12 ENCOUNTER — Ambulatory Visit: Payer: Medicare HMO | Admitting: Cardiovascular Disease

## 2018-11-12 ENCOUNTER — Encounter: Payer: Self-pay | Admitting: Cardiovascular Disease

## 2018-11-12 VITALS — BP 130/60 | HR 69 | Ht 63.0 in | Wt 268.0 lb

## 2018-11-12 DIAGNOSIS — E039 Hypothyroidism, unspecified: Secondary | ICD-10-CM

## 2018-11-12 DIAGNOSIS — K219 Gastro-esophageal reflux disease without esophagitis: Secondary | ICD-10-CM

## 2018-11-12 DIAGNOSIS — I10 Essential (primary) hypertension: Secondary | ICD-10-CM

## 2018-11-12 DIAGNOSIS — G4733 Obstructive sleep apnea (adult) (pediatric): Secondary | ICD-10-CM | POA: Diagnosis not present

## 2018-11-12 DIAGNOSIS — E785 Hyperlipidemia, unspecified: Secondary | ICD-10-CM | POA: Diagnosis not present

## 2018-11-12 DIAGNOSIS — Z6841 Body Mass Index (BMI) 40.0 and over, adult: Secondary | ICD-10-CM | POA: Diagnosis not present

## 2018-11-12 DIAGNOSIS — I25119 Atherosclerotic heart disease of native coronary artery with unspecified angina pectoris: Secondary | ICD-10-CM | POA: Diagnosis not present

## 2018-11-12 DIAGNOSIS — R0602 Shortness of breath: Secondary | ICD-10-CM | POA: Diagnosis not present

## 2018-11-12 NOTE — Progress Notes (Signed)
Cardiology Office Note    Date:  11/14/2018   ID:  Dawn Davidson, DOB [birth year], MRN [id number]  PCP:  Jonathon Jordan, MD  Cardiologist:  Shelva Majestic, MD   New sleep evaluation and to establish cardiology care with me.  History of Present Illness:  Dawn Davidson is a 68 y.o. female who presents for her initial sleep evaluation following institution of BiPAP therapy.  She also wishes me to assume her cardiology care.  Ms. Dawn Davidson is originally from [place] and moved to the [place] area over the last several years.  She has a history of morbid obesity and in the past her peak weight was 398 pounds.  She subsequently lost approximately 130 pounds.  She has known history of hypertension, hyperlipidemia, and coronary artery disease and suffered a myocardial infarction in 2010.  Her cardiology care is been done in [place].  In 2013 a DES stent was placed to her RCA at the [facility].  At that time she denied chest pain but experience shortness of breath and fatigue.  She has established her primary care with Dr. [name].  She had seen Dr. Harrell Gave on one occasion for cardiology evaluation of shortness of breath.  She admitted to being under significant amount of stress.  She has been caring for her 47 year old grandson who lives with her.  She also has a roommate.  She admits to being under significant stress.  She has a history of fibromyalgia.  Due to conference earns for obstructive sleep apnea she was referred for a sleep study which was done on April 12, 2018.  Symptoms included fatigue, morning headaches, snoring, witnessed apnea, and gasping during sleep.  Her initial Epworth Sleepiness Scale score was 10.  She was found to have at least moderate to moderately severe overall sleep apnea with an AHI of 27.1/h and RDI of 39.9/h.  She was unable to achieve any rem sleep on her diagnostic study.  Oxygen nadir with non-REM sleep was 88%.  She was  referred for CPAP and ultimate BiPAP titration on June 21, 2018.  During the initial part of the study the AHI was 30.8/h.  CPAP was initiated at 5 and was titrated to 11.  BiPAP was initiated at 12/8 and increased to 16/12.  She has now been on BiPAP therapy.  Her DME company is [employer].  A download was achieved from October 13, 2018 through November 11, 2018.  Usage was 100% 4 days used.  However, average usage was only 4 hours and 15 minutes.  She would only sleep for short duration and typically would use CPAP 2-3 times per day.  Her sleep pattern is very poor.  Typically she cannot go to sleep even though she tries going to sleep sometime around midnight.  However she basically states she sleeps from 7:30 AM until 10 AM then goes back to sleep for some time thereafter.  On her download, her average CPAP device pressure 90% of the time was 15.9 and although she is supposed to have a pressure support of 4 it appears that her average device pressure 90% of the time was 16.3.  Upon further review of interrogation it appears that her pressure support which should have been at 4 was set as a maximum of 4 and a minimum of 0 so most of the time she has not been truly on BiPAP therapy.  As result, AHI was still elevated at 11.3.  However she feels she is sleeping  better since initiating therapy.  A new Epworth Sleepiness Scale score was calculated in the office today and this endorsed at 2.  From a cardiac perspective, she denies any recurrent chest pain.  She admits to some shortness of breath.  She is unaware of palpitations.  She denies PND orthopnea.  She presents for evaluation.    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   Chronic lower back pain    Coronary artery disease    2010 MI   Depression    Family history of adverse reaction to anesthesia    her mother gets  sick   GERD (gastroesophageal reflux disease)    Hyperlipidemia    Hypertension    Hypothyroidism since 02/2015   Macular degeneration, bilateral    Migraine    "q couple weeks recently" (08/18/2015)   Morbid obesity ([place]) 05/10/2015   Myocardial infarction ([place]) 2012   Temporary low platelet count ([place]) 2012   checked every 6 mths...kept her in for 1 week with MI, and she's not sure exactly why    Past Surgical History:  Procedure Laterality Date   BACK SURGERY     BREAST BIOPSY Left 08/2015   BREAST LUMPECTOMY WITH RADIOACTIVE SEED LOCALIZATION Left 11/23/2015   Procedure: LEFT BREAST LUMPECTOMY WITH RADIOACTIVE SEED LOCALIZATION;  Surgeon: Fanny Skates, MD;  Location: [employer];  Service: General;  Laterality: Left;   CARDIAC CATHETERIZATION  2013   CHOLECYSTECTOMY OPEN     CORONARY ANGIOPLASTY WITH STENT PLACEMENT  2013   12/09/11 95% stenosis proximal RCA->DES; otherwise no significant CAD   EYE SURGERY     cataract right eye    GASTRIC RESTRICTION SURGERY  1974   JOINT REPLACEMENT Right    knee   MAXIMUM ACCESS (MAS)POSTERIOR LUMBAR INTERBODY FUSION (PLIF) 1 LEVEL N/A 08/18/2015   Procedure: Lumbar four-five Maximum access posterior lumbar interbody fusion;  Surgeon: Erline Levine, MD;  Location: [facility];  Service: Neurosurgery;  Laterality: N/A;  L4-5 Maximum access posterior lumbar interbody fusion   REPLACEMENT TOTAL KNEE Right 2007   TONSILLECTOMY     TOTAL HIP ARTHROPLASTY Right 11/01/2016   Procedure: RIGHT TOTAL HIP ARTHROPLASTY ANTERIOR APPROACH;  Surgeon: Mcarthur Rossetti, MD;  Location: [facility];  Service: Orthopedics;  Laterality: Right;   TOTAL KNEE ARTHROPLASTY Left 02/28/2017   Procedure: LEFT TOTAL KNEE ARTHROPLASTY;  Surgeon: Mcarthur Rossetti, MD;  Location: [facility];  Service: Orthopedics;  Laterality: Left;    Current Medications: Outpatient Medications Prior to Visit  Medication Sig Dispense Refill   aspirin EC 81 MG tablet  Take by mouth.     atorvastatin (LIPITOR) 40 MG tablet Take 40 mg by mouth daily.   3   betamethasone dipropionate (DIPROLENE) 0.05 % cream      cholecalciferol (VITAMIN D) 1000 UNITS tablet Take 1,000 Units by mouth daily.     DULoxetine (CYMBALTA) 60 MG capsule Take 60 mg by mouth 2 (two) times daily.     furosemide (LASIX) 20 MG tablet Take 1 tablet (20 mg total) by mouth daily. 30 tablet 0   hydrOXYzine (ATARAX/VISTARIL) 25 MG tablet      levothyroxine (SYNTHROID, LEVOTHROID) 50 MCG tablet Take 50 mcg by mouth daily before breakfast.   2   meloxicam (MOBIC) 15 MG tablet Take 1 tablet (15 mg total) by mouth daily as needed for  pain. 30 tablet 6   metoprolol tartrate (LOPRESSOR) 25 MG tablet Take 25 mg by mouth 2 (two) times daily.      omeprazole (PRILOSEC) 20 MG capsule Take 20 mg by mouth daily before breakfast.      potassium chloride (K-DUR) 10 MEQ tablet      tiZANidine (ZANAFLEX) 4 MG tablet      topiramate (TOPAMAX) 50 MG tablet Take 50 mg by mouth at bedtime.     traZODone (DESYREL) 100 MG tablet Take 100 mg by mouth at bedtime.     nitroGLYCERIN (NITROSTAT) 0.4 MG SL tablet Place 0.4 mg under the tongue every 5 (five) minutes as needed for chest pain. Reported on 10/09/2015     methocarbamol (ROBAXIN) 500 MG tablet      No facility-administered medications prior to visit.      Allergies:   Oxycodone; Sulfa antibiotics; and Venlafaxine   Social History   Socioeconomic History   Marital status: Divorced    Spouse name: Not on file   Number of children: Not on file   Years of education: Not on file   Highest education level: Not on file  Occupational History   Not on file  Social Needs   Financial resource strain: Not on file   Food insecurity:    Worry: Not on file    Inability: Not on file   Transportation needs:    Medical: Not on file    Non-medical: Not on file  Tobacco Use   Smoking status: Never Smoker   Smokeless tobacco: Never Used    Substance and Sexual Activity   Alcohol use: No    Alcohol/week: 0.0 standard drinks   Drug use: No   Sexual activity: Never  Lifestyle   Physical activity:    Days per week: Not on file    Minutes per session: Not on file   Stress: Not on file  Relationships   Social connections:    Talks on phone: Not on file    Gets together: Not on file    Attends religious service: Not on file    Active member of club or organization: Not on file    Attends meetings of clubs or organizations: Not on file    Relationship status: Not on file  Other Topics Concern   Not on file  Social History Narrative   Not on file    Social history is notable that she is from [facility].  She is divorced from many years and divorced at age 25.  She is a single mom.  She currently lives with a roommate and her 36 year old grandson.  She has 2 children and a grandchildren and great-grandchildren.  There is no tobacco history.  Family History:  The patient's family history includes Bipolar disorder in her daughter and daughter; Cancer in her brother; Heart disease in her sister; Stomach cancer in her mother; Thyroid disease in her daughter and grandchild.   ROS General: Negative; No fevers, chills, or night sweats; morbid obesity HEENT: Negative; No changes in vision or hearing, sinus congestion, difficulty swallowing Pulmonary: Negative; No cough, wheezing, shortness of breath, hemoptysis Cardiovascular: History of prior MI and DES stent. GI: GERD; she is status post gastric bypass surgery at [facility] GU: Negative; No dysuria, hematuria, or difficulty voiding Musculoskeletal: Fibromyalgia, arthritis Hematologic/Oncology: Negative; no easy bruising, bleeding Endocrine: Negative; no heat/cold intolerance; no diabetes Neuro: Negative; no changes in balance, headaches Skin: Negative; No rashes or skin lesions Psychiatric: Negative; No  behavioral problems, depression Sleep: Positive  for apnea with previous fatigue, snoring, awakening gasping for breath, witnessed apnea.  Symptoms improved since initiation of therapy.  No hypersomnolence, bruxism, restless legs, hypnogognic hallucinations, no cataplexy Other comprehensive 14 point system review is negative.   PHYSICAL EXAM:   VS:  BP 130/60    Pulse 69    Ht _0  (1.6 m)    Wt 268 lb (121.6 kg)    SpO2 97%    BMI 47.47 kg/m     Repeat blood pressure by me 140/70  Wt Readings from Last 3 Encounters:  11/12/18 268 lb (121.6 kg)  06/21/18 262 lb (118.8 kg)  04/12/18 262 lb (118.8 kg)    General: Alert, oriented, no distress.  Skin: normal turgor, no rashes, warm and dry HEENT: Normocephalic, atraumatic. Pupils equal round and reactive to light; sclera anicteric; extraocular muscles intact; Fundi status post cataract surgery with bilateral lens implants discs flat Nose without nasal septal hypertrophy Mouth/Parynx benign; Mallinpatti scale 3 Neck: No JVD, no carotid bruits; normal carotid upstroke Lungs: clear to ausculatation and percussion; no wheezing or rales Chest wall: without tenderness to palpitation Heart: PMI not displaced, RRR, s1 s2 normal, 1/6 systolic murmur, no diastolic murmur, no rubs, gallops, thrills, or heaves Abdomen: Central adiposity soft, nontender; no hepatosplenomehaly, BS+; abdominal aorta nontender and not dilated by palpation. Back: no CVA tenderness Pulses 2+ Musculoskeletal: full range of motion, normal strength, no joint deformities Extremities: no clubbing cyanosis or edema, Homan's sign negative  Neurologic: grossly nonfocal; Cranial nerves grossly wnl Psychologic: Normal mood and affect   Studies/Labs Reviewed:   EKG:  EKG is  ordered today. ECG (independently read by me): Sinus rhythm with first-degree AV block with a PR interval of 212 ms.  Low voltage.  No ectopy.  Recent Labs: BMP Latest Ref Rng & Units 04/28/2017 04/27/2017 03/01/2017  Glucose 65 - 99 mg/dL 122(H) 125(H)  144(H)  BUN 6 - 20 mg/dL _1 Creatinine 0.44 - 1.00 mg/dL 0.82 0.78 0.89  Sodium 135 - 145 mmol/L 139 136 137  Potassium 3.5 - 5.1 mmol/L 3.3(L) 3.5 4.3  Chloride 101 - 111 mmol/L 103 103 104  CO2 22 - 32 mmol/L _2 Calcium 8.9 - 10.3 mg/dL 9.0 9.0 7.9(L)     Hepatic Function Latest Ref Rng & Units 11/21/2015  Total Protein 6.5 - 8.1 g/dL 6.7  Albumin 3.5 - 5.0 g/dL 3.4(L)  AST 15 - 41 U/L 14(L)  ALT 14 - 54 U/L 10(L)  Alk Phosphatase 38 - 126 U/L 100  Total Bilirubin 0.3 - 1.2 mg/dL 0.5    CBC Latest Ref Rng & Units 04/27/2017 03/01/2017 02/21/2017  WBC 4.0 - 10.5 K/uL 8.5 11.4(H) 6.9  Hemoglobin 12.0 - 15.0 g/dL 10.3(L) 10.4(L) 11.8(L)  Hematocrit 36.0 - 46.0 % 33.6(L) 33.3(L) 37.5  Platelets 150 - 400 K/uL 215 184 203   Lab Results  Component Value Date   MCV 77.1 (L) 04/27/2017   MCV 80.0 03/01/2017   MCV 78.9 02/21/2017   No results found for: TSH Lab Results  Component Value Date   HGBA1C 6.5 (H) 04/27/2017     BNP    Component Value Date/Time   BNP 209.2 (H) 04/27/2017 1400    ProBNP No results found for: PROBNP   Lipid Panel  No results found for: CHOL, TRIG, HDL, CHOLHDL, VLDL, LDLCALC, LDLDIRECT   RADIOLOGY: No results found.   Additional studies/ records that were reviewed today  include:  I reviewed the prior evaluation from Dr. Harrell Gave.  Laboratory was reviewed.  A download was obtained in the office.  An Epworth Sleepiness Scale score was calculated.    ASSESSMENT:    1. OSA (obstructive sleep apnea)   2. Essential hypertension   3. Coronary artery disease involving native coronary artery of native heart with angina pectoris ([place]); prior myocardial infarction and later DES stent to RCA in 2013   4. SOB (shortness of breath)   5. Hyperlipidemia with target LDL less than 70   6. Morbid obesity with BMI of 45.0-49.9, adult [facility]); remote gastric bypass surgery at [facility]   7. Hypothyroidism, unspecified  type   8. Gastroesophageal reflux disease without esophagitis     PLAN:  Ms. Jordanna Hendrie is a 68 year old female who has a history of super morbid obesity with a prior peak weight of 398 pounds with subsequent 130 pound weight loss.  She has history of prior myocardial infarction and in 2013 underwent DES stenting to her RCA.  She was fairly symptomatic with sleep apnea symptoms and on diagnostic polysomnogram was found to have moderately severe overall sleep apnea.  However on that study she was unable to achieve any REM sleep.  She was ultimately titrated with CPAP and BiPAP therapy and was supposed to be on a a pressure support of 4 with an EPAP minimum of 14 and IPAP maximum of 25.  However, upon interrogation of her settings it appears that her pressure support was set at a minimum of 0 with a maximum of 4.  As result, her greater than 90% pressure was 16.3/15.9.  Her AHI was improved but still increased at 11.3.  I have made changes to her BiPAP settings in the office today such that her pressure support is set at 4.  She has had difficulty with her mask which may be too small for her fit.  She has a ResMed air fit F 30 mask and she may need a medium rather than small size.  I had a long discussion with her regarding the effects of untreated sleep apnea on sleep architecture, as well as cardiovascular health with association with hypertension, nocturnal arrhythmias, atrial fibrillation, potential risk for MI and stroke while sleeping if associated with significant hypoxemia.  Also discussed its potential implications with GERD as well as glucose regulation.  I discussed with her the importance of optimal sleep duration.  Her sleep hygiene is exceedingly poor.  She is on numerous medications tensioning may interfere with her ability to achieve and sleep including Cymbalta as well as Desyrel.  I am hesitant to provide an additional sleep aid with her concomitant medication and it may be worthwhile to see  if some of her medications can be adjusted by her primary physician.  We discussed the importance of weight loss and I commended her on her 130 pound weight loss but unfortunately she is still morbidly obese with a BMI of 47.47.  I will obtain a new download in approximately 1 month and have suggested the new mask size.  With reference to her shortness of breath I am scheduling her for an echo Doppler study to assess systolic and diastolic function valvular architecture and potential for pulmonary hypertension.  A complete set of fasting laboratory will be obtained.  We discussed the necessity for increased sleep duration and discussed improve sleep hygiene.  I will see her in 2 months for reevaluation.  Medication Adjustments/Labs and Tests Ordered: Current medicines  are reviewed at length with the patient today.  Concerns regarding medicines are outlined above.  Medication changes, Labs and Tests ordered today are listed in the Patient Instructions below. Patient Instructions  Medication Instructions:  The current medical regimen is effective;  continue present plan and medications.  If you need a refill on your cardiac medications before your next appointment, please call your pharmacy.   Lab work: Fasting lab work (CBC, CMET, TSH, LIPID)  If you have labs (blood work) drawn today and your tests are completely normal, you will receive your results only by:  [employer] (if you have MyChart) OR  A paper copy in the mail If you have any lab test that is abnormal or we need to change your treatment, we will call you to review the results.  Testing/Procedures: Echocardiogram - Your physician has requested that you have an echocardiogram. Echocardiography is a painless test that uses sound waves to create images of your heart. It provides your doctor with information about the size and shape of your heart and how well your hearts chambers and valves are working. This procedure takes  approximately one hour. There are no restrictions for this procedure. This will be performed at our [facility] location - [address].   Follow-Up: At [facility], you and your health needs are our priority.  As part of our continuing mission to provide you with exceptional heart care, we have created designated Provider Care Teams.  These Care Teams include your primary Cardiologist (physician) and Advanced Practice Providers (APPs -  Physician Assistants and Nurse Practitioners) who all work together to provide you with the care you need, when you need it. You will need a follow up appointment in 2 months.  Please call our office 2 months in advance to schedule this appointment.  You may see Dr.Desiray Orchard Claiborne Billings or one of the following Advanced Practice Providers on your designated Care Team: Almyra Deforest, PA-C  Fabian Sharp, [place]        Signed, Shelva Majestic, MD  11/14/2018 3:36 PM    [place] [address] [place], [place], [place]  [address] Phone: [phone]

## 2018-11-12 NOTE — Patient Instructions (Signed)
Medication Instructions:  The current medical regimen is effective;  continue present plan and medications.  If you need a refill on your cardiac medications before your next appointment, please call your pharmacy.   Lab work: Fasting lab work (CBC, CMET, TSH, LIPID)  If you have labs (blood work) drawn today and your tests are completely normal, you will receive your results only by: [facility]land Kitchen MyChart Message (if you have MyChart) OR . A paper copy in the mail If you have any lab test that is abnormal or we need to change your treatment, we will call you to review the results.  Testing/Procedures: Echocardiogram - Your physician has requested that you have an echocardiogram. Echocardiography is a painless test that uses sound waves to create images of your heart. It provides your doctor with information about the size and shape of your heart and how well your heart's chambers and valves are working. This procedure takes approximately one hour. There are no restrictions for this procedure. This will be performed at our [facility] location - [address], [address].   Follow-Up: At [facility], you and your health needs are our priority.  As part of our continuing mission to provide you with exceptional heart care, we have created designated Provider Care Teams.  These Care Teams include your primary Cardiologist (physician) and Advanced Practice Providers (APPs -  Physician Assistants and Nurse Practitioners) who all work together to provide you with the care you need, when you need it. You will need a follow up appointment in 2 months.  Please call our office 2 months in advance to schedule this appointment.  You may see Dr.Thomas Claiborne Billings or one of the following Advanced Practice Providers on your designated Care Team: Almyra Deforest, [place] . Fabian Sharp, PA-C

## 2018-11-12 NOTE — Telephone Encounter (Signed)
-----   Message from Caprice Beaver, LPN sent at 9/77/4142  3:07 PM EDT ----- Dr.Kelly would like to switch her to a medium size mask of the [id number]. Thanks!

## 2018-11-12 NOTE — Telephone Encounter (Signed)
Order for Medium Airview F-30 mask faxed to [place].

## 2018-11-14 ENCOUNTER — Encounter: Payer: Self-pay | Admitting: Cardiovascular Disease

## 2018-11-24 ENCOUNTER — Telehealth: Payer: Self-pay

## 2018-11-24 NOTE — Telephone Encounter (Signed)
New message   Spoke with patient per staff messages from Dr. Radford Pax echocardiogram  has been rescheduled to 01/14/19

## 2018-11-24 NOTE — Telephone Encounter (Signed)
Noted. Thanks.

## 2018-11-26 ENCOUNTER — Ambulatory Visit (HOSPITAL_COMMUNITY): Payer: Medicare HMO

## 2018-11-26 DIAGNOSIS — G4733 Obstructive sleep apnea (adult) (pediatric): Secondary | ICD-10-CM | POA: Diagnosis not present

## 2018-12-16 DIAGNOSIS — R739 Hyperglycemia, unspecified: Secondary | ICD-10-CM | POA: Diagnosis not present

## 2018-12-16 DIAGNOSIS — E1122 Type 2 diabetes mellitus with diabetic chronic kidney disease: Secondary | ICD-10-CM | POA: Diagnosis not present

## 2018-12-16 DIAGNOSIS — E559 Vitamin D deficiency, unspecified: Secondary | ICD-10-CM | POA: Diagnosis not present

## 2018-12-16 DIAGNOSIS — I10 Essential (primary) hypertension: Secondary | ICD-10-CM | POA: Diagnosis not present

## 2018-12-16 DIAGNOSIS — E039 Hypothyroidism, unspecified: Secondary | ICD-10-CM | POA: Diagnosis not present

## 2018-12-22 DIAGNOSIS — F322 Major depressive disorder, single episode, severe without psychotic features: Secondary | ICD-10-CM | POA: Diagnosis not present

## 2018-12-22 DIAGNOSIS — Z0000 Encounter for general adult medical examination without abnormal findings: Secondary | ICD-10-CM | POA: Diagnosis not present

## 2018-12-22 DIAGNOSIS — E1140 Type 2 diabetes mellitus with diabetic neuropathy, unspecified: Secondary | ICD-10-CM | POA: Diagnosis not present

## 2018-12-22 DIAGNOSIS — Z6841 Body Mass Index (BMI) 40.0 and over, adult: Secondary | ICD-10-CM | POA: Diagnosis not present

## 2018-12-22 DIAGNOSIS — N183 Chronic kidney disease, stage 3 (moderate): Secondary | ICD-10-CM | POA: Diagnosis not present

## 2018-12-22 DIAGNOSIS — E039 Hypothyroidism, unspecified: Secondary | ICD-10-CM | POA: Diagnosis not present

## 2018-12-22 DIAGNOSIS — E1165 Type 2 diabetes mellitus with hyperglycemia: Secondary | ICD-10-CM | POA: Diagnosis not present

## 2018-12-22 DIAGNOSIS — M5416 Radiculopathy, lumbar region: Secondary | ICD-10-CM | POA: Diagnosis not present

## 2018-12-22 DIAGNOSIS — E1122 Type 2 diabetes mellitus with diabetic chronic kidney disease: Secondary | ICD-10-CM | POA: Diagnosis not present

## 2018-12-22 DIAGNOSIS — Z9884 Bariatric surgery status: Secondary | ICD-10-CM | POA: Diagnosis not present

## 2018-12-23 ENCOUNTER — Telehealth: Payer: Self-pay | Admitting: *Deleted

## 2018-12-23 ENCOUNTER — Encounter: Payer: Self-pay | Admitting: *Deleted

## 2018-12-23 NOTE — Telephone Encounter (Signed)
Returned a call to the patient . She informs me that she is having trouble getting mask ordered by Dr Claiborne Billings. They state the reason as her being non compliant. I told the patient that I will contact [employer] to see if I can find out what they are needing and let her know.

## 2018-12-23 NOTE — Telephone Encounter (Signed)
Called patient to inform her that I spoke with [employer] and they told me that they needed a note from Dr Claiborne Billings asking for a compliance extension. A letter was typed up and sent to them via Epic as well as manually. Patient notified.

## 2018-12-27 DIAGNOSIS — G4733 Obstructive sleep apnea (adult) (pediatric): Secondary | ICD-10-CM | POA: Diagnosis not present

## 2018-12-31 DIAGNOSIS — G4733 Obstructive sleep apnea (adult) (pediatric): Secondary | ICD-10-CM | POA: Diagnosis not present

## 2019-01-11 ENCOUNTER — Telehealth (HOSPITAL_COMMUNITY): Payer: Self-pay

## 2019-01-11 NOTE — Telephone Encounter (Signed)

## 2019-01-13 DIAGNOSIS — I10 Essential (primary) hypertension: Secondary | ICD-10-CM | POA: Diagnosis not present

## 2019-01-13 DIAGNOSIS — E785 Hyperlipidemia, unspecified: Secondary | ICD-10-CM | POA: Diagnosis not present

## 2019-01-13 DIAGNOSIS — R0602 Shortness of breath: Secondary | ICD-10-CM | POA: Diagnosis not present

## 2019-01-14 ENCOUNTER — Ambulatory Visit (HOSPITAL_COMMUNITY): Payer: Medicare HMO | Attending: Cardiovascular Disease

## 2019-01-14 ENCOUNTER — Other Ambulatory Visit: Payer: Self-pay

## 2019-01-14 DIAGNOSIS — R0602 Shortness of breath: Secondary | ICD-10-CM | POA: Diagnosis not present

## 2019-01-14 DIAGNOSIS — I10 Essential (primary) hypertension: Secondary | ICD-10-CM | POA: Diagnosis not present

## 2019-01-14 LAB — LIPID PANEL
Chol/HDL Ratio: 3.2 ratio (ref 0.0–4.4)
Cholesterol, Total: 137 mg/dL (ref 100–199)
HDL: 43 mg/dL (ref >39)
LDL Calculated: 61 mg/dL (ref 0–99)
Triglycerides: 166 mg/dL — ABNORMAL HIGH (ref 0–149)
VLDL Cholesterol Cal: 33 mg/dL (ref 5–40)

## 2019-01-14 LAB — COMPREHENSIVE METABOLIC PANEL
ALT: 8 IU/L (ref 0–32)
AST: 10 IU/L (ref 0–40)
Albumin/Globulin Ratio: 1.6 (ref 1.2–2.2)
Albumin: 4.3 g/dL (ref 3.8–4.8)
Alkaline Phosphatase: 110 IU/L (ref 39–117)
BUN/Creatinine Ratio: 15 (ref 12–28)
BUN: 19 mg/dL (ref 8–27)
Bilirubin Total: 0.3 mg/dL (ref 0.0–1.2)
CO2: 25 mmol/L (ref 20–29)
Calcium: 9.4 mg/dL (ref 8.7–10.3)
Chloride: 98 mmol/L (ref 96–106)
Creatinine, Ser: 1.28 mg/dL — ABNORMAL HIGH (ref 0.57–1.00)
GFR calc Af Amer: 50 mL/min/{1.73_m2} — ABNORMAL LOW (ref >59)
GFR calc non Af Amer: 43 mL/min/{1.73_m2} — ABNORMAL LOW (ref >59)
Globulin, Total: 2.7 g/dL (ref 1.5–4.5)
Glucose: 193 mg/dL — ABNORMAL HIGH (ref 65–99)
Potassium: 4.3 mmol/L (ref 3.5–5.2)
Sodium: 139 mmol/L (ref 134–144)
Total Protein: 7 g/dL (ref 6.0–8.5)

## 2019-01-14 LAB — TSH: TSH: 3 u[IU]/mL (ref 0.450–4.500)

## 2019-01-14 LAB — CBC
Hematocrit: 40.7 % (ref 34.0–46.6)
Hemoglobin: 13.2 g/dL (ref 11.1–15.9)
MCH: 24.6 pg — ABNORMAL LOW (ref 26.6–33.0)
MCHC: 32.4 g/dL (ref 31.5–35.7)
MCV: 76 fL — ABNORMAL LOW (ref 79–97)
Platelets: 223 10*3/uL (ref 150–450)
RBC: 5.36 x10E6/uL — ABNORMAL HIGH (ref 3.77–5.28)
RDW: 14.6 % (ref 11.7–15.4)
WBC: 7.9 10*3/uL (ref 3.4–10.8)

## 2019-01-15 ENCOUNTER — Telehealth: Payer: Self-pay | Admitting: Cardiovascular Disease

## 2019-01-15 NOTE — Telephone Encounter (Signed)
Mych[place], no smartphone, consent (01/13/19), pre reg complete 01/15/19 AF

## 2019-01-18 ENCOUNTER — Telehealth (INDEPENDENT_AMBULATORY_CARE_PROVIDER_SITE_OTHER): Payer: Medicare HMO | Admitting: Cardiovascular Disease

## 2019-01-18 VITALS — BP 168/75 | Ht 64.0 in | Wt 267.0 lb

## 2019-01-18 DIAGNOSIS — Z6841 Body Mass Index (BMI) 40.0 and over, adult: Secondary | ICD-10-CM | POA: Diagnosis not present

## 2019-01-18 DIAGNOSIS — G4733 Obstructive sleep apnea (adult) (pediatric): Secondary | ICD-10-CM | POA: Diagnosis not present

## 2019-01-18 DIAGNOSIS — E785 Hyperlipidemia, unspecified: Secondary | ICD-10-CM

## 2019-01-18 DIAGNOSIS — I10 Essential (primary) hypertension: Secondary | ICD-10-CM

## 2019-01-18 DIAGNOSIS — I25119 Atherosclerotic heart disease of native coronary artery with unspecified angina pectoris: Secondary | ICD-10-CM

## 2019-01-18 DIAGNOSIS — E039 Hypothyroidism, unspecified: Secondary | ICD-10-CM | POA: Diagnosis not present

## 2019-01-18 NOTE — Progress Notes (Signed)
Virtual Visit via Telephone Note   This visit type was conducted due to national recommendations for restrictions regarding the COVID-19 Pandemic (e.g. social distancing) in an effort to limit this patient's exposure and mitigate transmission in our community.  Due to her co-morbid illnesses, this patient is at least at moderate risk for complications without adequate follow up.  This format is felt to be most appropriate for this patient at this time.  The patient did not have access to video technology/had technical difficulties with video requiring transitioning to audio format only (telephone).  All issues noted in this document were discussed and addressed.  No physical exam could be performed with this format.  Please refer to the patient's chart for her  consent to telehealth for [facility].   Date:  01/18/2019   ID:  Dawn Davidson, DOB 1950-09-10, MRN [id number]  Patient Location: Home Provider Location: Home  PCP:  Jonathon Jordan, MD  Cardiologist:  Shelva Majestic, MD Electrophysiologist:  None   Evaluation Performed:  Follow-Up Visit  Chief Complaint:  2 month evaluation  History of Present Illness:    Dawn Davidson is a 68 y.o. female who is originally from [place] and moved to the [place] area over the last several years.  She has a history of morbid obesity and in the past her peak weight was 398 pounds.  She subsequently lost approximately 130 pounds.  She has known history of hypertension, hyperlipidemia, and coronary artery disease and suffered a myocardial infarction in 2010.  Her cardiology care is been done in [place].  In 2013 a DES stent was placed to her RCA at the [facility] [place] [facility].  At that time she denied chest pain but experience shortness of breath and fatigue.  She has established her primary care with Dr. [name].  She had seen Dr. Harrell Gave on one occasion for cardiology evaluation of shortness of breath.  She admitted  to being under significant amount of stress.  She has been caring for her 61 year old grandson who lives with her.  She also has a roommate.  She admits to being under significant stress.  She has a history of fibromyalgia.  Due to conference earns for obstructive sleep apnea she was referred for a sleep study which was done on April 12, 2018.  Symptoms included fatigue, morning headaches, snoring, witnessed apnea, and gasping during sleep.  Her initial Epworth Sleepiness Scale score was 10.  She was found to have at least moderate to moderately severe overall sleep apnea with an AHI of 27.1/h and RDI of 39.9/h.  She was unable to achieve any rem sleep on her diagnostic study.  Oxygen nadir with non-REM sleep was 88%.  She was referred for CPAP and ultimate BiPAP titration on June 21, 2018.  During the initial part of the study the AHI was 30.8/h.  CPAP was initiated at 5 and was titrated to 11.  BiPAP was initiated at 12/8 and increased to 16/12.  She has now been on BiPAP therapy.  Her DME company is [employer].  A download was achieved from October 13, 2018 through November 11, 2018.  Usage was 100% 4 days used.  However, average usage was only 4 hours and 15 minutes.  She would only sleep for short duration and typically would use CPAP 2-3 times per day.  Her sleep pattern is very poor.  Typically she cannot go to sleep even though she tries going to sleep sometime around midnight.  However she  basically states she sleeps from 7:30 AM until 10 AM then goes back to sleep for some time thereafter.  On her download, her average CPAP device pressure 90% of the time was 15.9 and although she is supposed to have a pressure support of 4 it appears that her average device pressure 90% of the time was 16.3.  Upon further review of interrogation it appears that her pressure support which should have been at 4 was set as a maximum of 4 and a minimum of 0 so most of the time she has not been truly on BiPAP therapy.  As  result, AHI was still elevated at 11.3.  However she feels she is sleeping better since initiating therapy.  A new Epworth Sleepiness Scale score was calculated in the office today and this endorsed at 2.  I saw her for initial evaluation on November 12, 2018.  She denied any current chest pain symptomatology.  After interrogating her BiPAP unit, I made changes to her BiPAP setting up with a pressure support minimum of 4 up to a maximum of 6.  She also had difficulty with her mask which was contributing to a significant leak.  I recommended a new ResMed air fit F 30 mask medium size rather than small.  She just received her new mask but has not yet tried it.  I had a lengthy discussion with her regarding the effects of untreated sleep apnea on sleep architecture, as well as cardiovascular health with association with hypertension, nocturnal arrhythmias, atrial fibrillation, potential risk for MI and stroke while sleeping if associated with significant hypoxemia.  I also discussed its potential implications with GERD as well as glucose regulation.  I discussed with her the importance of optimal sleep duration.  She had extremely poor sleep hygiene.  She also was on medications with potentially could interfere with her ability to achieve REM sleep.  I discussed the importance of additional weight loss.   Over the past several months, she has felt improved and is sleeping better.  I obtained a download from March 12 through Jan 20, 2019.  She is married meeting compliance standards with 100% of usage days.  Usage greater than 4 hours is 75.4%.  Her AHI was 9.5, with an apnea index of 2.5 and hypopnea index 4.8.  90% therapy was 19.7/15.4.  She did have moderate leak at 44.6 L/min this was with her old mask.  Since I initially saw her, she underwent an echo Doppler study which showed an EF of 60 to 65% and evidence for at least grade 1 diastolic dysfunction.  There was aortic valve sclerosis with moderate aortic  insufficiency.  From a cardiac perspective, she denies any recurrent chest pain.  She admits to some shortness of breath. She is unaware of palpitations.  She denies PND orthopnea.    She presents for evaluation   The patient does not have symptoms concerning for COVID-19 infection (fever, chills, cough, or new shortness of breath).    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   Chronic lower back pain    Coronary artery disease    2010 MI   Depression    Family history of adverse reaction to anesthesia    her mother gets sick   GERD (gastroesophageal reflux disease)    Hyperlipidemia    Hypertension    Hypothyroidism  since 02/2015   Macular degeneration, bilateral    Migraine    "q couple weeks recently" (08/18/2015)   Morbid obesity ([place]) 05/10/2015   Myocardial infarction [facility]) 2012   Temporary low platelet count ([place]) 2012   checked every 6 mths...kept her in for 1 week with MI, and she's not sure exactly why   Past Surgical History:  Procedure Laterality Date   BACK SURGERY     BREAST BIOPSY Left 08/2015   BREAST LUMPECTOMY WITH RADIOACTIVE SEED LOCALIZATION Left 11/23/2015   Procedure: LEFT BREAST LUMPECTOMY WITH RADIOACTIVE SEED LOCALIZATION;  Surgeon: Fanny Skates, MD;  Location: [facility];  Service: General;  Laterality: Left;   CARDIAC CATHETERIZATION  2013   CHOLECYSTECTOMY OPEN     CORONARY ANGIOPLASTY WITH STENT PLACEMENT  2013   12/09/11 95% stenosis proximal RCA->DES; otherwise no significant CAD   EYE SURGERY     cataract right eye    GASTRIC RESTRICTION SURGERY  1974   JOINT REPLACEMENT Right    knee   MAXIMUM ACCESS (MAS)POSTERIOR LUMBAR INTERBODY FUSION (PLIF) 1 LEVEL N/A 08/18/2015   Procedure: Lumbar four-five Maximum access posterior lumbar interbody fusion;  Surgeon: Erline Levine, MD;  Location: [facility];   Service: Neurosurgery;  Laterality: N/A;  L4-5 Maximum access posterior lumbar interbody fusion   REPLACEMENT TOTAL KNEE Right 2007   TONSILLECTOMY     TOTAL HIP ARTHROPLASTY Right 11/01/2016   Procedure: RIGHT TOTAL HIP ARTHROPLASTY ANTERIOR APPROACH;  Surgeon: Mcarthur Rossetti, MD;  Location: [facility];  Service: Orthopedics;  Laterality: Right;   TOTAL KNEE ARTHROPLASTY Left 02/28/2017   Procedure: LEFT TOTAL KNEE ARTHROPLASTY;  Surgeon: Mcarthur Rossetti, MD;  Location: [facility];  Service: Orthopedics;  Laterality: Left;     Current Meds  Medication Sig   aspirin EC 81 MG tablet Take by mouth.   atorvastatin (LIPITOR) 40 MG tablet Take 40 mg by mouth daily.    betamethasone dipropionate (DIPROLENE) 0.05 % cream    carisoprodol (SOMA) 350 MG tablet Take 350 mg by mouth as needed.   cholecalciferol (VITAMIN D) 1000 UNITS tablet Take 1,000 Units by mouth daily.   DULoxetine (CYMBALTA) 60 MG capsule Take 60 mg by mouth 2 (two) times daily.   furosemide (LASIX) 20 MG tablet Take 1 tablet (20 mg total) by mouth daily.   hydrOXYzine (ATARAX/VISTARIL) 25 MG tablet Take 25 mg by mouth 2 (two) times a day.    levothyroxine (SYNTHROID, LEVOTHROID) 50 MCG tablet Take 50 mcg by mouth daily before breakfast.    meloxicam (MOBIC) 15 MG tablet Take 1 tablet (15 mg total) by mouth daily as needed for pain.   metoprolol tartrate (LOPRESSOR) 25 MG tablet Take 25 mg by mouth 2 (two) times daily.    nitroGLYCERIN (NITROSTAT) 0.4 MG SL tablet Place 0.4 mg under the tongue every 5 (five) minutes as needed for chest pain. Reported on 10/09/2015   omeprazole (PRILOSEC) 20 MG capsule Take 20 mg by mouth daily before breakfast.    potassium chloride (K-DUR) 10 MEQ tablet Take 10 mEq by mouth daily.    topiramate (TOPAMAX) 50 MG tablet Take 50 mg by mouth at bedtime.   traZODone (DESYREL) 100 MG tablet Take 100 mg by mouth at bedtime.   [DISCONTINUED] tiZANidine (ZANAFLEX) 4 MG tablet Take  4 mg by mouth daily.      Allergies:   Oxycodone; Sulfa antibiotics; and Venlafaxine   Social History   Tobacco Use   Smoking status: Never Smoker  Smokeless tobacco: Never Used  Substance Use Topics   Alcohol use: No    Alcohol/week: 0.0 standard drinks   Drug use: No     Family Hx: The patient's family history includes Bipolar disorder in her daughter and daughter; Cancer in her brother; Heart disease in her sister; Stomach cancer in her mother; Thyroid disease in her daughter and grandchild.  ROS:   Please see the history of present illness.    No fever chills or night sweats. No change in vision or hearing No cough wheezing Remote MI and DES stenting no recurrent angina Positive for GERD, status post gastric bypass surgery Fibromyalgia, arthritis No bleeding No rash No new neurologic symptoms Sleeping better with BiPAP.  Still continues to have poor sleep hygiene. All other systems reviewed and are negative.   Prior CV studies:   The following studies were reviewed today:  ECHO IMPRESSION  1. The left ventricle has normal systolic function with an ejection fraction of 60-65%. The cavity size was normal. Left ventricular diastolic Doppler parameters are consistent with impaired relaxation.  2. The right ventricle has normal systolic function. The cavity was normal.  3. The mitral valve is grossly normal. There is mild mitral annular calcification present.  4. The tricuspid valve is grossly normal.  5. The aortic valve is tricuspid. Mild thickening of the aortic valve. Aortic valve regurgitation is moderate by color flow Doppler. No stenosis of the aortic valve.  6. Normal LV function; mild diastolic dysfunction; sclerotic aortic valve with moderate AI.  A new download was obtained from her BiPAP unit as noted above in HPI  Labs/Other Tests and Data Reviewed:    EKG:  An ECG dated 11/12/2018 was personally reviewed today and demonstrated:  Sinus rhythm with  first-degree AV block with a PR interval of 212 ms.  Low voltage.  No ectopy.  Recent Labs: 01/13/2019: ALT 8; BUN 19; Creatinine, Ser 1.28; Hemoglobin 13.2; Platelets 223; Potassium 4.3; Sodium 139; TSH 3.000   Recent Lipid Panel Lab Results  Component Value Date/Time   CHOL 137 01/13/2019 02:40 PM   TRIG 166 (H) 01/13/2019 02:40 PM   HDL 43 01/13/2019 02:40 PM   CHOLHDL 3.2 01/13/2019 02:40 PM   LDLCALC 61 01/13/2019 02:40 PM    Wt Readings from Last 3 Encounters:  01/18/19 267 lb (121.1 kg)  11/12/18 268 lb (121.6 kg)  06/21/18 262 lb (118.8 kg)     Objective:    Vital Signs:  BP (!) 168/75 Comment: When she had echo last Thursday.   Ht 5\' 4"  (1.626 m)    Wt 267 lb (121.1 kg)    BMI 45.83 kg/m    She states that if her blood pressure had been checked after her arrival rather than immediately her blood pressure had significantly reduced and would not be is elevated.  A NEW physical exam could not be done in person since this was a telemedicine visit She denies any change in appearance.  Her weight is stable since her last office visit Breathing is normal and not labored There is no audible wheezing Previously she was found to have a Mallinpatti scale of 3 Previous thick neck Heart rate is regular by her palpation There is no chest wall tenderness to pressure or abdominal discomfort. She previously was documented to have central adiposity She denies swelling There are no neurologic changes that she is aware of Normal cognition and affect  ASSESSMENT & PLAN:    1. Obstructive sleep apnea, now  on BiPAP therapy: I again had a long discussion with Ms. Culliton reviewed the importance of continuing to use her BiPAP therapy.  We again discussed the adverse cardiovascular consequences associated with untreated sleep apnea.  We discussed optimal sleep duration of 7 to 8 hours with BiPAP use.  She is compliant each evening although greater than 4 hours should be significantly  increased.  I discussed with her that the preponderance of REM sleep occurs in the second half of the night that she takes her mask off after she comes back from going to the bathroom sleeps without it when she is in most need for her BiPAP she does not have the mask on.  This undoubtedly affects her ability to achieve good deeper of sleep.  However, she does feel she is sleeping much better than she had previous treatment.  I obtained downloads over the past 3 months and then over the past 2 months.  AHI is improved but elevated.  I have recommended slight adjustment with her pressure support will changes to a pressure support minimum of 5 up to a maximum of 7.  She just received a new medium size mask which I think will significantly improve her mask leak.  I will obtain a new download in 1 month and adjustments can be made at that time.  However I plan to see her face-to-face hopefully in 4 months. 2. Essential hypertension.  Her blood pressure today at home was elevated.  We discussed optimal blood pressure and stage I hypertension beginning at 130/80.  Her blood pressure recorded above was when she showed up last week for her echo Doppler study and was taken immediately upon arrival when she was still nervous.  Later on during the procedure she was much more relaxed and is certain that her blood pressure had reduced but it was not rechecked.  I discussed obtaining a blood pressure cuff to allow her to monitor her blood pressures at home.  He continues metoprolol 50 mg twice a day and in the past had consistent heart rates in the 60s.  I would not increase metoprolol at this point.  She may ultimately benefit from the addition of ACE or ARB therapy particularly with her documented diastolic dysfunction. 3. Diastolic dysfunction: I reviewed her echo Doppler study which showed normal systolic function.  There was grade 1 diastolic dysfunction most likely contributed by her hypertension.  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 4. Morbid obesity: She has lost significant amount of weight following her gastric bypass surgery but BMI is still consistent with morbid obesity.  We discussed the importance of exercise and weight loss both with reference to blood pressure control, cardiovascular benefit as well as sleep apnea. 5. Hyperlipidemia: Target LDL is less than 70 in this patient with established CAD.  She continues to be on atorvastatin 40 mg.  Most recent LDL is 61. 6. Hypothyroidism: Currently on levothyroxine replacement.  COVID-19 Education: The signs and symptoms of COVID-19 were discussed with the patient and how to seek care for testing (follow up with PCP or arrange E-visit).  The importance of social distancing was discussed today.  Time:   Today, I have spent 26 minutes with the patient with telehealth technology discussing the above problems.     Medication Adjustments/Labs and Tests Ordered: Current medicines are reviewed at length with the patient today.  Concerns regarding medicines are outlined above.   Tests Ordered: No orders of the  defined types were placed in this encounter.   Medication Changes: No orders of the defined types were placed in this encounter.   Disposition:  Follow up office visit in 4 months  Signed, Shelva Majestic, MD  01/18/2019 10:06 AM    [place]

## 2019-01-18 NOTE — Patient Instructions (Addendum)
Medication Instructions:  The current medical regimen is effective;  continue present plan and medications.  If you need a refill on your cardiac medications before your next appointment, please call your pharmacy.   Follow-Up: At [facility], you and your health needs are our priority.  As part of our continuing mission to provide you with exceptional heart care, we have created designated Provider Care Teams.  These Care Teams include your primary Cardiologist (physician) and Advanced Practice Providers (APPs -  Physician Assistants and Nurse Practitioners) who all work together to provide you with the care you need, when you need it. You will need a follow up appointment in 4 months.  Please call our office 2 months in advance to schedule this appointment.  You may see Dr.Kelly or one of the following Advanced Practice Providers on your designated Care Team: Almyra Deforest, [place] . Fabian Sharp, PA-C  Any Other Special Instructions Will Be Listed Below (If Applicable). Change pressure support from 4-6 to 5-7

## 2019-01-26 DIAGNOSIS — G4733 Obstructive sleep apnea (adult) (pediatric): Secondary | ICD-10-CM | POA: Diagnosis not present

## 2019-02-26 DIAGNOSIS — G4733 Obstructive sleep apnea (adult) (pediatric): Secondary | ICD-10-CM | POA: Diagnosis not present

## 2019-03-26 DIAGNOSIS — M461 Sacroiliitis, not elsewhere classified: Secondary | ICD-10-CM | POA: Diagnosis not present

## 2019-03-28 DIAGNOSIS — G4733 Obstructive sleep apnea (adult) (pediatric): Secondary | ICD-10-CM | POA: Diagnosis not present

## 2019-04-15 DIAGNOSIS — G4733 Obstructive sleep apnea (adult) (pediatric): Secondary | ICD-10-CM | POA: Diagnosis not present

## 2019-04-16 DIAGNOSIS — E1122 Type 2 diabetes mellitus with diabetic chronic kidney disease: Secondary | ICD-10-CM | POA: Diagnosis not present

## 2019-04-16 DIAGNOSIS — N189 Chronic kidney disease, unspecified: Secondary | ICD-10-CM | POA: Diagnosis not present

## 2019-04-16 DIAGNOSIS — B372 Candidiasis of skin and nail: Secondary | ICD-10-CM | POA: Diagnosis not present

## 2019-04-16 DIAGNOSIS — G4733 Obstructive sleep apnea (adult) (pediatric): Secondary | ICD-10-CM | POA: Diagnosis not present

## 2019-04-23 DIAGNOSIS — G4733 Obstructive sleep apnea (adult) (pediatric): Secondary | ICD-10-CM | POA: Diagnosis not present

## 2019-04-28 DIAGNOSIS — G4733 Obstructive sleep apnea (adult) (pediatric): Secondary | ICD-10-CM | POA: Diagnosis not present

## 2019-05-12 DIAGNOSIS — M545 Low back pain: Secondary | ICD-10-CM | POA: Diagnosis not present

## 2019-05-14 DIAGNOSIS — R3915 Urgency of urination: Secondary | ICD-10-CM | POA: Diagnosis not present

## 2019-05-14 DIAGNOSIS — Z23 Encounter for immunization: Secondary | ICD-10-CM | POA: Diagnosis not present

## 2019-05-29 DIAGNOSIS — G4733 Obstructive sleep apnea (adult) (pediatric): Secondary | ICD-10-CM | POA: Diagnosis not present

## 2019-06-28 DIAGNOSIS — G4733 Obstructive sleep apnea (adult) (pediatric): Secondary | ICD-10-CM | POA: Diagnosis not present

## 2019-07-13 ENCOUNTER — Ambulatory Visit: Payer: Medicare HMO | Admitting: Podiatry

## 2019-07-15 DIAGNOSIS — G4733 Obstructive sleep apnea (adult) (pediatric): Secondary | ICD-10-CM | POA: Diagnosis not present

## 2019-07-19 ENCOUNTER — Ambulatory Visit: Payer: Medicare HMO | Admitting: Podiatry

## 2019-07-28 ENCOUNTER — Other Ambulatory Visit: Payer: Self-pay

## 2019-07-28 ENCOUNTER — Ambulatory Visit: Payer: Medicare HMO | Admitting: Podiatry

## 2019-07-28 ENCOUNTER — Encounter: Payer: Self-pay | Admitting: Podiatry

## 2019-07-28 DIAGNOSIS — M21619 Bunion of unspecified foot: Secondary | ICD-10-CM | POA: Diagnosis not present

## 2019-07-28 DIAGNOSIS — Q828 Other specified congenital malformations of skin: Secondary | ICD-10-CM | POA: Diagnosis not present

## 2019-07-28 NOTE — Progress Notes (Signed)
Subjective:   Patient ID: Dawn Davidson, female   DOB: 68 y.o.   MRN: [id number]   HPI Patient presents with lesion on the bottom of the right foot and bunion left foot that becomes painful with certain shoes.  States that this lesion right has been present for the last 6 months to year she does not remember stepping on anything and patient does not smoke and likes to be active   Review of Systems  All other systems reviewed and are negative.       Objective:  Physical Exam Vitals signs and nursing note reviewed.  Constitutional:      Appearance: She is well-developed.  Pulmonary:     Effort: Pulmonary effort is normal.  Musculoskeletal: Normal range of motion.  Skin:    General: Skin is warm.  Neurological:     Mental Status: She is alert.     Neurovascular status intact muscle strength found to be adequate range of motion within normal limits with patient noted to have a painful area plantar aspect right foot with keratotic tissue formation and lesion formation.  On the left I noted there is a large bunion deformity with digital deformity second digit with redness around the first metatarsal head.  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  For the right 1 deep debridement of lesion accomplished and instructed on medication usage and padding and patient will be seen back to recheck

## 2019-07-29 DIAGNOSIS — G4733 Obstructive sleep apnea (adult) (pediatric): Secondary | ICD-10-CM | POA: Diagnosis not present

## 2019-08-28 DIAGNOSIS — G4733 Obstructive sleep apnea (adult) (pediatric): Secondary | ICD-10-CM | POA: Diagnosis not present

## 2019-08-30 DIAGNOSIS — E1122 Type 2 diabetes mellitus with diabetic chronic kidney disease: Secondary | ICD-10-CM | POA: Diagnosis not present

## 2019-08-30 DIAGNOSIS — Z7984 Long term (current) use of oral hypoglycemic drugs: Secondary | ICD-10-CM | POA: Diagnosis not present

## 2019-10-28 NOTE — Progress Notes (Signed)
Cardiology Office Note:    Date:  10/29/2019   ID:  Dawn Davidson, DOB 03-02-1951, MRN [id number]  PCP:  Jonathon Jordan, MD  Cardiologist:  Buford Dresser, MD   Referring MD: Jonathon Jordan, MD   Chief Complaint  Patient presents with  . Follow-up    OSA/CPAP, hypertension, diastolic dysfunction, hyperlipidemia,    History of Present Illness:    Dawn Davidson is a 69 y.o. female with a hx of HTN, HLD, CAD: MI 2010.  2013 DES stent to RCA, history of DOE, OSA on BiPAP therapy, morbid obesity, fibromyalgia.  Recent telemedicine visit with Dr. Claiborne Billings regarding her obstructive sleep apnea and CPAP therapy.  Patient has been having trouble getting her CPAP equipment.  She needs a CPAP titration and new equipment.    She denies any particular complaints today other than significant social stress in her life as well as significant insomnia and sleep deprivation associated with a poorly fitting mask for her CPAP therapy.  Apparently she needs evidence via CPAP machine data to assess compliance with therapy her respiratory care provider.  She denies any progressive anginal or exertional symptoms, palpitations or arrhythmias, orthostatic symptoms, syncope or near syncope PND orthopnea, CVA or TIA-like symptoms, dyspeptic symptoms, or blood in stool or urine.  Denies any claudication-like symptoms, or lower extremity edema.  She does have mild lower extremity PAD superficial varicosities.  She admits she is overweight and has problems with his obesity.  States she is not eating as well as she should but is trying to eat better avoiding simple carbohydrates due to her diabetes.  She was taking both [place] and Metformin.  She stated the [place] was being supplied by samples from PCP but she was unable to afford prescription.  States she has ran out of the [place].  Her renal function was elevated and last checked with creatinine of 1.28 and GFR of 43.  Previous renal function studies over  the last 2 years have been essentially normal except for an elevation on November 02, 2016.   Past Medical History:  Diagnosis Date  . Anxiety   . Arthritis    "neck, back, hands, knees, hips" (08/18/2015)  . 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 Chronic lower back pain   . Coronary artery disease    2010 MI  . Depression   . Family history of adverse reaction to anesthesia    her mother gets sick  . GERD (gastroesophageal reflux disease)   . Hyperlipidemia   . Hypertension   . Hypothyroidism since 02/2015  . Macular degeneration, bilateral   . Migraine    "q couple weeks recently" (08/18/2015)  . Morbid obesity ([place]) 05/10/2015  . Myocardial infarction ([place]) 2012  . Temporary low platelet count ([place]) 2012   checked every 6 mths...kept her in for 1 week with MI, and she's not sure exactly why    Past Surgical History:  Procedure Laterality Date  . BACK SURGERY    . BREAST BIOPSY Left 08/2015  . BREAST LUMPECTOMY WITH RADIOACTIVE SEED LOCALIZATION Left 11/23/2015   Procedure: LEFT BREAST LUMPECTOMY WITH RADIOACTIVE SEED LOCALIZATION;  Surgeon: Fanny Skates, MD;  Location: [place] [facility];  Service: General;  Laterality: Left;  . CARDIAC CATHETERIZATION  2013  . CHOLECYSTECTOMY OPEN    . CORONARY ANGIOPLASTY WITH STENT PLACEMENT  2013   12/09/11 95% stenosis proximal RCA->DES; otherwise no significant CAD  . EYE SURGERY     cataract right eye   .  GASTRIC RESTRICTION SURGERY  1974  . JOINT REPLACEMENT Right    knee  . MAXIMUM ACCESS (MAS)POSTERIOR LUMBAR INTERBODY FUSION (PLIF) 1 LEVEL N/A 08/18/2015   Procedure: Lumbar four-five Maximum access posterior lumbar interbody fusion;  Surgeon: Erline Levine, MD;  Location: [facility];  Service: Neurosurgery;  Laterality: N/A;  L4-5 Maximum access posterior lumbar interbody fusion  . REPLACEMENT TOTAL KNEE Right 2007  . TONSILLECTOMY    . TOTAL HIP ARTHROPLASTY Right 11/01/2016   Procedure: RIGHT TOTAL HIP ARTHROPLASTY ANTERIOR  APPROACH;  Surgeon: Mcarthur Rossetti, MD;  Location: [facility];  Service: Orthopedics;  Laterality: Right;  . TOTAL KNEE ARTHROPLASTY Left 02/28/2017   Procedure: LEFT TOTAL KNEE ARTHROPLASTY;  Surgeon: Mcarthur Rossetti, MD;  Location: [facility];  Service: Orthopedics;  Laterality: Left;    Current Medications: Current Meds  Medication Sig  . amoxicillin (AMOXIL) 875 MG tablet Take 875 mg by mouth 2 (two) times daily.  Marland Kitchen aspirin EC 81 MG tablet Take by mouth.  Marland Kitchen atorvastatin (LIPITOR) 40 MG tablet Take 40 mg by mouth daily.   . carisoprodol (SOMA) 350 MG tablet Take 350 mg by mouth as needed.  . cephALEXin (KEFLEX) 500 MG capsule Take 500 mg by mouth 2 (two) times daily.  . cholecalciferol (VITAMIN D) 1000 UNITS tablet Take 1,000 Units by mouth daily.  . DULoxetine (CYMBALTA) 60 MG capsule Take 60 mg by mouth 2 (two) times daily.  Marland Kitchen FARXIGA 5 MG TABS tablet   . furosemide (LASIX) 20 MG tablet Take 1 tablet (20 mg total) by mouth daily.  . hydrOXYzine (ATARAX/VISTARIL) 25 MG tablet Take 25 mg by mouth 2 (two) times a day.   . levothyroxine (SYNTHROID, LEVOTHROID) 50 MCG tablet Take 50 mcg by mouth daily before breakfast.   . metFORMIN (GLUCOPHAGE-XR) 500 MG 24 hr tablet Take 500 mg by mouth 2 (two) times daily.  . metoprolol tartrate (LOPRESSOR) 25 MG tablet Take 25 mg by mouth 2 (two) times daily.   . nitroGLYCERIN (NITROSTAT) 0.4 MG SL tablet Place 0.4 mg under the tongue every 5 (five) minutes as needed for chest pain. Reported on 10/09/2015  . omeprazole (PRILOSEC) 20 MG capsule Take 20 mg by mouth daily before breakfast.   . potassium chloride (K-DUR) 10 MEQ tablet Take 10 mEq by mouth daily.   . predniSONE (DELTASONE) 10 MG tablet SMARTSIG:3 Tablet(s) By Mouth Every Other Day PRN  . topiramate (TOPAMAX) 50 MG tablet Take 50 mg by mouth at bedtime.  . traZODone (DESYREL) 100 MG tablet Take 100 mg by mouth at bedtime.     Allergies:   Oxycodone, Sulfa antibiotics, and Venlafaxine     Social History   Socioeconomic History  . Marital status: Divorced    Spouse name: Not on file  . Number of children: Not on file  . Years of education: Not on file  . Highest education level: Not on file  Occupational History  . Not on file  Tobacco Use  . Smoking status: Never Smoker  . Smokeless tobacco: Never Used  Substance and Sexual Activity  . Alcohol use: No    Alcohol/week: 0.0 standard drinks  . Drug use: No  . Sexual activity: Never  Other Topics Concern  . Not on file  Social History Narrative  . Not on file   Social Determinants of Health   Financial Resource Strain:   . Difficulty of Paying Living Expenses: Not on file  Food Insecurity:   . Worried About Running  Out of Food in the Last Year: Not on file  . Ran Out of Food in the Last Year: Not on file  Transportation Needs:   . Lack of Transportation (Medical): Not on file  . Lack of Transportation (Non-Medical): Not on file  Physical Activity:   . Days of Exercise per Week: Not on file  . Minutes of Exercise per Session: Not on file  Stress:   . Feeling of Stress : Not on file  Social Connections:   . Frequency of Communication with Friends and Family: Not on file  . Frequency of Social Gatherings with Friends and Family: Not on file  . Attends Religious Services: Not on file  . Active Member of Clubs or Organizations: Not on file  . Attends Archivist Meetings: Not on file  . Marital Status: Not on file     Family History: The patient's family history includes Bipolar disorder in her daughter and daughter; Cancer in her brother; Heart disease in her sister; Stomach cancer in her mother; Thyroid disease in her daughter and grandchild.  ROS:   Please see the history of present illness.     All other systems reviewed and are negative.  EKGs/Labs/Other Studies Reviewed:    The following studies were reviewed today:  ECHO 01/14/2019 IMPRESSION 1. The left ventricle has normal  systolic function with an ejection fraction of 60-65%. The cavity size was normal. Left ventricular diastolic Doppler parameters are consistent with impaired relaxation. 2. The right ventricle has normal systolic function. The cavity was normal. 3. The mitral valve is grossly normal. There is mild mitral annular calcification present. 4. The tricuspid valve is grossly normal. 5. The aortic valve is tricuspid. Mild thickening of the aortic valve. Aortic valve regurgitation is moderate by color flow Doppler. No stenosis of the aortic valve. 6. Normal LV function; mild diastolic dysfunction; sclerotic aortic valve with moderate AI  EKG:  EKG is 10/29/2019 ordered today.  The ekg ordered today demonstrates normal sinus rhythm rate of 67, low voltage QRS.  Normal axis.  No evidence of LVH.  No acute ST or T wave abnormalities.  Recent Labs: 01/13/2019: ALT 8; BUN 19; Creatinine, Ser 1.28; Hemoglobin 13.2; Platelets 223; Potassium 4.3; Sodium 139; TSH 3.000  Recent Lipid Panel    Component Value Date/Time   CHOL 137 01/13/2019 1440   TRIG 166 (H) 01/13/2019 1440   HDL 43 01/13/2019 1440   CHOLHDL 3.2 01/13/2019 1440   LDLCALC 61 01/13/2019 1440    Physical Exam:    VS:  BP (!) 154/62   Pulse 67   Ht 5\' 4"  (1.626 m)   Wt 258 lb (117 kg)   BMI 44.29 kg/m     Wt Readings from Last 3 Encounters:  10/29/19 258 lb (117 kg)  01/18/19 267 lb (121.1 kg)  11/12/18 268 lb (121.6 kg)     GEN: Obese well nourished, well developed in no acute distress HEENT: Normal NECK: No JVD; No carotid bruits LYMPHATICS: No lymphadenopathy CARDIAC: RRR, no murmurs, rubs, gallops RESPIRATORY:  Clear to auscultation without rales, wheezing or rhonchi  MUSCULOSKELETAL:  No edema; No deformity  SKIN: Warm and dry NEUROLOGIC:  Alert and oriented x 3 PSYCHIATRIC:  Normal affect   ASSESSMENT:    1. Essential hypertension   2. Diastolic dysfunction   3. Hyperlipidemia with target LDL less than 70   4.  Obstructive sleep apnea (adult) (pediatric)   5. Hypothyroidism, unspecified type    PLAN:  In order of problems listed above:  1. Essential hypertension Blood pressure was elevated on arrival today.  Recheck in left arm was 140/80.  We will start losartan 25 mg p.o. daily dependent on BMET to be drawn today.  Advised the patient not to fill prescription until she receives notice from [place] regarding lab work to assess renal function.  Continue furosemide 20 mg daily.  Continue metoprolol 25 mg p.o. twice daily.  2. Diastolic dysfunction Evidence of impaired relaxation on recent echocardiogram in Jan 14, 2019.  EF of 60 to 65% and  3. Hyperlipidemia with target LDL less than 70 Patient had a lipid study 9 months ago showing total cholesterol 137, triglycerides 166, HDL 43, LDL 61.  Continue atorvastatin 40 mg daily.  4. Obstructive sleep apnea (adult) (pediatric) History of obstructive sleep apnea on CPAP.  Patient is having issues getting a properly fitting CPAP mask and is having problems sleeping with significant insomnia.  We have CPAP/OSA specialist who handles coordination between respiratory therapy providers.  We have talked to [place] at [facility] who has advised patient to come for a visit and have a proper fitting mask placed.  She will have to be compliant with CPAP therapy for at least 30 days to assess any further titration of pressures.  We thank [facility] for assistance in collaboration.  5.  Hypothyroidism Recent TSH was 3.  Currently taking levothyroxine 50 mcg daily.  6.  Type 2 diabetes.  Patient follows with PCP for diabetes.  She was taking [place] and Metformin.  She states that the [place] was unaffordable.  Currently only takes Metformin  Medication Adjustments/Labs and Tests Ordered: Current medicines are reviewed at length with the patient today.  Concerns regarding medicines are outlined above.  Orders Placed This Encounter  Procedures  . Basic metabolic panel    Meds ordered this encounter  Medications  . losartan (COZAAR) 25 MG tablet    Sig: Take 1 tablet (25 mg total) by mouth daily.    Dispense:  30 tablet    Refill:  3    Signed, Verta Ellen, NP  10/29/2019 12:28 PM    [facility]

## 2019-10-29 ENCOUNTER — Other Ambulatory Visit: Payer: Self-pay

## 2019-10-29 ENCOUNTER — Ambulatory Visit: Payer: Medicare Other | Admitting: Physician Assistant

## 2019-10-29 ENCOUNTER — Encounter: Payer: Self-pay | Admitting: Physician Assistant

## 2019-10-29 VITALS — BP 140/64 | HR 67 | Ht 64.0 in | Wt 258.0 lb

## 2019-10-29 DIAGNOSIS — I10 Essential (primary) hypertension: Secondary | ICD-10-CM

## 2019-10-29 DIAGNOSIS — E785 Hyperlipidemia, unspecified: Secondary | ICD-10-CM

## 2019-10-29 DIAGNOSIS — I5189 Other ill-defined heart diseases: Secondary | ICD-10-CM | POA: Diagnosis not present

## 2019-10-29 DIAGNOSIS — G4733 Obstructive sleep apnea (adult) (pediatric): Secondary | ICD-10-CM

## 2019-10-29 DIAGNOSIS — E039 Hypothyroidism, unspecified: Secondary | ICD-10-CM

## 2019-10-29 MED ORDER — LOSARTAN POTASSIUM 25 MG PO TABS: 25.0000 mg | ORAL_TABLET | Freq: Every day | ORAL | 3 refills | Status: DC

## 2019-10-29 NOTE — Patient Instructions (Addendum)
Medication Instructions:   Losartan 25 mg--Do not start until we call you with lab results.  *If you need a refill on your cardiac medications before your next appointment, please call your pharmacy*   Lab Work: Your physician recommends that you return for lab work today: BMET  If you have labs (blood work) drawn today and your tests are completely normal, you will receive your results only by: Marland Kitchen MyChart Message (if you have MyChart) OR . A paper copy in the mail If you have any lab test that is abnormal or we need to change your treatment, we will call you to review the results.   Follow-Up: At [facility], you and your health needs are our priority.  As part of our continuing mission to provide you with exceptional heart care, we have created designated Provider Care Teams.  These Care Teams include your primary Cardiologist (physician) and Advanced Practice Providers (APPs -  Physician Assistants and Nurse Practitioners) who all work together to provide you with the care you need, when you need it.  We recommend signing up for the patient portal called "MyChart".  Sign up information is provided on this After Visit Summary.  MyChart is used to connect with patients for Virtual Visits (Telemedicine).  Patients are able to view lab/test results, encounter notes, upcoming appointments, etc.  Non-urgent messages can be sent to your provider as well.   To learn more about what you can do with MyChart, go to [email or web address].     Your next appointment:   You have been scheduled for an appointment with Fabian Sharp, PA on Monday, 11/15/19 at 2:45 PM.  Please schedule an appointment with Dr. Claiborne Billings in 2 months to discuss sleep compliance.

## 2019-10-30 LAB — BASIC METABOLIC PANEL
BUN/Creatinine Ratio: 17 (ref 12–28)
BUN: 16 mg/dL (ref 8–27)
CO2: 25 mmol/L (ref 20–29)
Calcium: 9.2 mg/dL (ref 8.7–10.3)
Chloride: 99 mmol/L (ref 96–106)
Creatinine, Ser: 0.95 mg/dL (ref 0.57–1.00)
GFR calc Af Amer: 71 mL/min/{1.73_m2} (ref >59)
GFR calc non Af Amer: 62 mL/min/{1.73_m2} (ref >59)
Glucose: 121 mg/dL — ABNORMAL HIGH (ref 65–99)
Potassium: 4.5 mmol/L (ref 3.5–5.2)
Sodium: 140 mmol/L (ref 134–144)

## 2019-11-02 ENCOUNTER — Other Ambulatory Visit: Payer: Self-pay | Admitting: *Deleted

## 2019-11-02 MED ORDER — LOSARTAN POTASSIUM 25 MG PO TABS: 25.0000 mg | ORAL_TABLET | Freq: Every day | ORAL | 3 refills | Status: DC

## 2019-11-05 ENCOUNTER — Other Ambulatory Visit (INDEPENDENT_AMBULATORY_CARE_PROVIDER_SITE_OTHER): Payer: Medicare Other

## 2019-11-05 DIAGNOSIS — I10 Essential (primary) hypertension: Secondary | ICD-10-CM | POA: Diagnosis not present

## 2019-11-14 NOTE — Progress Notes (Signed)
Virtual Visit via Telephone Note   This visit type was conducted due to national recommendations for restrictions regarding the COVID-19 Pandemic (e.g. social distancing) in an effort to limit this patient's exposure and mitigate transmission in our community.  Due to her co-morbid illnesses, this patient is at least at moderate risk for complications without adequate follow up.  This format is felt to be most appropriate for this patient at this time.  The patient did not have access to video technology/had technical difficulties with video requiring transitioning to audio format only (telephone).  All issues noted in this document were discussed and addressed.  No physical exam could be performed with this format.  Please refer to the patient's chart for her  consent to telehealth for [facility].   The patient was identified using 2 identifiers.  Date:  11/15/2019   ID:  Dawn Davidson, DOB December 20, 1950, MRN [id number]  Patient Location: Home Provider Location: Home  PCP:  Jonathon Jordan, MD  Cardiologist:  Buford Dresser, MD  Electrophysiologist:  None   Evaluation Performed:  Follow-Up Visit  Chief Complaint:  Hypertension, OSA on CPAP  History of Present Illness:    Dawn Davidson is a 69 y.o. female with a hx of obesity with a past weight loss of 130 pounds, hypertension, hyperlipidemia, CAD with history of MI in 2010.  Her cardiology care was previously in [place].  She had a DES placed to the RCA in 2013 at [facility].  She recently had a sleep study and was placed on BiPAP.  She was seen by Dr. Claiborne Billings in March 2020 and complained of shortness of breath.  He recommended follow-up echocardiogram which showed a normal EF, grade 1 diastolic dysfunction, moderate aortic insufficiency.  She was hypertensive at her last visit on 01/18/2019.   She is maintained on 20 mg of Lasix and 25 mg Lopressor twice daily.  Dr. Claiborne Billings recommended blood pressure log  and that she may benefit from ACE/ARB therapy.  She was recently seen in clinic on 10/29/19 and complained of not having a usable CPAP mask. She was unable to get a CPAP mask due to lack of compliance data. She was hypertensive at that visit and losartan 25 mg was started.   I was able to connect with her via telehalth visit today. She has been unable to obtain a BP machine. She was able to get her CPAP mask and has been sleeping 4-5 hours nightly and feels great. She also feels much better since starting the losartan. She has not received a BP machine yet from [place], but is able to buy one this Friday with her SS check. Overall, she is feeling much better, no complaints.   The patient does not have symptoms concerning for COVID-19 infection (fever, chills, cough, or new shortness of breath).    Past Medical History:  Diagnosis Date  . Anxiety   . Arthritis    "neck, back, hands, knees, hips" (08/18/2015)  . 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 Chronic lower back pain   . Coronary artery disease    2010 MI  . Depression   . Family history of adverse reaction to anesthesia    her mother gets sick  . GERD (gastroesophageal reflux disease)   . Hyperlipidemia   . Hypertension   . Hypothyroidism since 02/2015  . Macular degeneration, bilateral   . Migraine    "q couple weeks recently" (08/18/2015)  . Morbid obesity ([place])  05/10/2015  . Myocardial infarction ([place]) 2012  . Temporary low platelet count ([place]) 2012   checked every 6 mths...kept her in for 1 week with MI, and she's not sure exactly why   Past Surgical History:  Procedure Laterality Date  . BACK SURGERY    . BREAST BIOPSY Left 08/2015  . BREAST LUMPECTOMY WITH RADIOACTIVE SEED LOCALIZATION Left 11/23/2015   Procedure: LEFT BREAST LUMPECTOMY WITH RADIOACTIVE SEED LOCALIZATION;  Surgeon: Fanny Skates, MD;  Location: [place];  Service: General;  Laterality: Left;  . CARDIAC CATHETERIZATION  2013  .  CHOLECYSTECTOMY OPEN    . CORONARY ANGIOPLASTY WITH STENT PLACEMENT  2013   12/09/11 95% stenosis proximal RCA->DES; otherwise no significant CAD  . EYE SURGERY     cataract right eye   . GASTRIC RESTRICTION SURGERY  1974  . JOINT REPLACEMENT Right    knee  . MAXIMUM ACCESS (MAS)POSTERIOR LUMBAR INTERBODY FUSION (PLIF) 1 LEVEL N/A 08/18/2015   Procedure: Lumbar four-five Maximum access posterior lumbar interbody fusion;  Surgeon: Erline Levine, MD;  Location: [place] [facility];  Service: Neurosurgery;  Laterality: N/A;  L4-5 Maximum access posterior lumbar interbody fusion  . REPLACEMENT TOTAL KNEE Right 2007  . TONSILLECTOMY    . TOTAL HIP ARTHROPLASTY Right 11/01/2016   Procedure: RIGHT TOTAL HIP ARTHROPLASTY ANTERIOR APPROACH;  Surgeon: Mcarthur Rossetti, MD;  [facility];  Service: Orthopedics;  Laterality: Right;  . TOTAL KNEE ARTHROPLASTY Left 02/28/2017   Procedure: LEFT TOTAL KNEE ARTHROPLASTY;  Surgeon: Mcarthur Rossetti, MD;  [facility];  Service: Orthopedics;  Laterality: Left;     Current Meds  Medication Sig  . amoxicillin (AMOXIL) 875 MG tablet Take 875 mg by mouth 2 (two) times daily.  Marland Kitchen aspirin EC 81 MG tablet Take by mouth.  Marland Kitchen atorvastatin (LIPITOR) 40 MG tablet Take 40 mg by mouth daily.   . carisoprodol (SOMA) 350 MG tablet Take 350 mg by mouth as needed.  . cephALEXin (KEFLEX) 500 MG capsule Take 500 mg by mouth 2 (two) times daily.  . cholecalciferol (VITAMIN D) 1000 UNITS tablet Take 1,000 Units by mouth daily.  . DULoxetine (CYMBALTA) 60 MG capsule Take 60 mg by mouth 2 (two) times daily.  Marland Kitchen FARXIGA 5 MG TABS tablet   . furosemide (LASIX) 20 MG tablet Take 1 tablet (20 mg total) by mouth daily.  . hydrOXYzine (ATARAX/VISTARIL) 25 MG tablet Take 25 mg by mouth 2 (two) times a day.   . levothyroxine (SYNTHROID, LEVOTHROID) 50 MCG tablet Take 50 mcg by mouth daily before breakfast.   . losartan (COZAAR) 25 MG tablet Take 1 tablet (25 mg total) by mouth  daily.  . metFORMIN (GLUCOPHAGE-XR) 500 MG 24 hr tablet Take 500 mg by mouth 2 (two) times daily.  . metoprolol tartrate (LOPRESSOR) 25 MG tablet Take 25 mg by mouth 2 (two) times daily.   . nitroGLYCERIN (NITROSTAT) 0.4 MG SL tablet Place 0.4 mg under the tongue every 5 (five) minutes as needed for chest pain. Reported on 10/09/2015  . omeprazole (PRILOSEC) 20 MG capsule Take 20 mg by mouth daily before breakfast.   . potassium chloride (K-DUR) 10 MEQ tablet Take 10 mEq by mouth daily.   . predniSONE (DELTASONE) 10 MG tablet SMARTSIG:3 Tablet(s) By Mouth Every Other Day PRN  . topiramate (TOPAMAX) 50 MG tablet Take 50 mg by mouth at bedtime.  . traZODone (DESYREL) 100 MG tablet Take 100 mg by mouth at bedtime.     Allergies:  Oxycodone, Sulfa antibiotics, and Venlafaxine   Social History   Tobacco Use  . Smoking status: Never Smoker  . Smokeless tobacco: Never Used  Substance Use Topics  . Alcohol use: No    Alcohol/week: 0.0 standard drinks  . Drug use: No     Family Hx: The patient's family history includes Bipolar disorder in her daughter and daughter; Cancer in her brother; Heart disease in her sister; Stomach cancer in her mother; Thyroid disease in her daughter and grandchild.  ROS:   Please see the history of present illness.     All other systems reviewed and are negative.   Prior CV studies:   The following studies were reviewed today:  Echo 01/14/19 1. The left ventricle has normal systolic function with an ejection  fraction of 60-65%. The cavity size was normal. Left ventricular diastolic  Doppler parameters are consistent with impaired relaxation.  2. The right ventricle has normal systolic function. The cavity was  normal.  3. The mitral valve is grossly normal. There is mild mitral annular  calcification present.  4. The tricuspid valve is grossly normal.  5. The aortic valve is tricuspid. Mild thickening of the aortic valve.  Aortic valve regurgitation  is moderate by color flow Doppler. No stenosis  of the aortic valve.  6. Normal LV function; mild diastolic dysfunction; sclerotic aortic valve  with moderate AI.   Labs/Other Tests and Data Reviewed:    EKG:  No ECG reviewed.  Recent Labs: 01/13/2019: ALT 8; Hemoglobin 13.2; Platelets 223; TSH 3.000 10/29/2019: BUN 16; Creatinine, Ser 0.95; Potassium 4.5; Sodium 140   Recent Lipid Panel Lab Results  Component Value Date/Time   CHOL 137 01/13/2019 02:40 PM   TRIG 166 (H) 01/13/2019 02:40 PM   HDL 43 01/13/2019 02:40 PM   CHOLHDL 3.2 01/13/2019 02:40 PM   LDLCALC 61 01/13/2019 02:40 PM    Wt Readings from Last 3 Encounters:  11/15/19 260 lb (117.9 kg)  10/29/19 258 lb (117 kg)  01/18/19 267 lb (121.1 kg)     Objective:    Vital Signs:  Ht 5\' 4"  (1.626 m)   Wt 260 lb (117.9 kg) Comment: taken 3/12  BMI 44.63 kg/m    VITAL SIGNS:  reviewed GEN:  no acute distress RESPIRATORY:  respirations unlabored NEURO:  alert and oriented x 3, no obvious focal deficit PSYCH:  normal affect  ASSESSMENT & PLAN:    Hypertension - started losartan 25 mg at last visit - continue lopressor 25 mg BID - PCP is checking her A1c next week - will ask for a BMP at that time to be faxed to [place] - no BP cuff, no BP log at this visit (BP machine from friend was broken, BP machine ordered from [place] not yet arrived) - she will have a BP machine by this Friday, but can already tell she feels much better   CAD with prior stent to the RCA (2013) - continue ASA   Hyperlipidemia 01/13/2019: Cholesterol, Total 137; HDL 43; LDL Calculated 61; Triglycerides 166 - at goal, continue statin - recheck lipids annually   OSA on CPAP - was able to get her a mask to use to show compliance - she is now sleeping 4-5 hours nightly and feels much better    Will see her virtually in 2 weeks for blood pressure check.    COVID-19 Education: The signs and symptoms of COVID-19 were discussed with the  patient and how to seek care for testing (  follow up with PCP or arrange E-visit).  The importance of social distancing was discussed today.  Time:   Today, I have spent 16 minutes with the patient with telehealth technology discussing the above problems.     Medication Adjustments/Labs and Tests Ordered: Current medicines are reviewed at length with the patient today.  Concerns regarding medicines are outlined above.   Tests Ordered: No orders of the defined types were placed in this encounter.   Medication Changes: No orders of the defined types were placed in this encounter.   Follow Up:  Virtual Visit  in 2 week(s)  Signed, Ledora Bottcher, PA  11/15/2019 10:42 AM    [place]

## 2019-11-15 ENCOUNTER — Telehealth (INDEPENDENT_AMBULATORY_CARE_PROVIDER_SITE_OTHER): Payer: Medicare Other | Admitting: Physician Assistant

## 2019-11-15 ENCOUNTER — Encounter: Payer: Self-pay | Admitting: Physician Assistant

## 2019-11-15 ENCOUNTER — Ambulatory Visit: Payer: Medicare Other | Admitting: Physician Assistant

## 2019-11-15 VITALS — Ht 64.0 in | Wt 260.0 lb

## 2019-11-15 DIAGNOSIS — I10 Essential (primary) hypertension: Secondary | ICD-10-CM | POA: Diagnosis not present

## 2019-11-15 DIAGNOSIS — G4733 Obstructive sleep apnea (adult) (pediatric): Secondary | ICD-10-CM

## 2019-11-15 DIAGNOSIS — E785 Hyperlipidemia, unspecified: Secondary | ICD-10-CM

## 2019-11-15 DIAGNOSIS — I25119 Atherosclerotic heart disease of native coronary artery with unspecified angina pectoris: Secondary | ICD-10-CM

## 2019-11-15 NOTE — Patient Instructions (Signed)
Medication Instructions:  Your physician recommends that you continue on your current medications as directed. Please refer to the Current Medication list given to you today.  *If you need a refill on your cardiac medications before your next appointment, please call your pharmacy*   Lab Work: Please have BMET (Basic Metabolic Panel) drawn at your PCP office. Fax results to Fabian Sharp, [place] at [phone]. I will mail the lab slip to you to take with you to PCP office.  If you have labs (blood work) drawn today and your tests are completely normal, you will receive your results only by: Marland Kitchen MyChart Message (if you have MyChart) OR . A paper copy in the mail If you have any lab test that is abnormal or we need to change your treatment, we will call you to review the results.   Follow-Up: At [facility], you and your health needs are our priority.  As part of our continuing mission to provide you with exceptional heart care, we have created designated Provider Care Teams.  These Care Teams include your primary Cardiologist (physician) and Advanced Practice Providers (APPs -  Physician Assistants and Nurse Practitioners) who all work together to provide you with the care you need, when you need it.  We recommend signing up for the patient portal called "MyChart".  Sign up information is provided on this After Visit Summary.  MyChart is used to connect with patients for Virtual Visits (Telemedicine).  Patients are able to view lab/test results, encounter notes, upcoming appointments, etc.  Non-urgent messages can be sent to your provider as well.   To learn more about what you can do with MyChart, go to [email or web address].    Your next appointment:   Friday, 11/26/19 at 10:15 AM.  The format for your next appointment:   Virtual Visit   Provider:   Fabian Sharp, PA-C

## 2019-11-23 NOTE — Progress Notes (Signed)
Virtual Visit via Telephone Note   This visit type was conducted due to national recommendations for restrictions regarding the COVID-19 Pandemic (e.g. social distancing) in an effort to limit this patient's exposure and mitigate transmission in our community.  Due to her co-morbid illnesses, this patient is at least at moderate risk for complications without adequate follow up.  This format is felt to be most appropriate for this patient at this time.  The patient did not have access to video technology/had technical difficulties with video requiring transitioning to audio format only (telephone).  All issues noted in this document were discussed and addressed.  No physical exam could be performed with this format.  Please refer to the patient's chart for her  consent to telehealth for [facility].   The patient was identified using 2 identifiers.  Date:  11/26/2019   ID:  Dawn Davidson, DOB September 13, 1950, MRN [id number]  Patient Location: Home Provider Location: Office  PCP:  Jonathon Jordan, MD  Cardiologist:  Buford Dresser, MD  Electrophysiologist:  None   Evaluation Performed:  Follow-Up Visit  Chief Complaint:  HTN  History of Present Illness:    LEMOYNE Davidson is a 69 y.o. female with a hx of obesity with a past weight loss of 130 pounds, hypertension, hyperlipidemia, CAD with history of MI in 2010.  Her cardiology care was previously in [place].  She had a DES placed to the RCA in 2013 at [facility] and [facility].  She recently had a sleep study and was placed on BiPAP.  She was seen by Dr. Claiborne Billings in March 2020 and complained of shortness of breath.  He recommended follow-up echocardiogram which showed a normal EF, grade 1 diastolic dysfunction, moderate aortic insufficiency.  She was hypertensive at her last visit on 01/18/2019.   She is maintained on 20 mg of Lasix and 25 mg Lopressor twice daily.  Dr. Claiborne Billings recommended blood pressure log and that she may  benefit from ACE/ARB therapy. When I saw her 11/15/19, she had not obtained a BP machine, but stated she would get one soon.  I was able to connect with her via telemedicine visit. BP log reveals readings 127/56-130/60 with HR in the 70s. She feels well with this pressure. She is compliant on her BiPAP and is getting more rest. Overall, no complaints, she feels great. BP high today and she states this is abnormal for her. She will continue to monitor her BP.   The patient does not have symptoms concerning for COVID-19 infection (fever, chills, cough, or new shortness of breath).    Past Medical History:  Diagnosis Date  . Anxiety   . Arthritis    "neck, back, hands, knees, hips" (08/18/2015)  . 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 Chronic lower back pain   . Coronary artery disease    2010 MI  . Depression   . Family history of adverse reaction to anesthesia    her mother gets sick  . GERD (gastroesophageal reflux disease)   . Hyperlipidemia   . Hypertension   . Hypothyroidism since 02/2015  . Macular degeneration, bilateral   . Migraine    "q couple weeks recently" (08/18/2015)  . Morbid obesity ([place]) 05/10/2015  . Myocardial infarction ([place]) 2012  . Temporary low platelet count ([place]) 2012   checked every 6 mths...kept her in for 1 week with MI, and she's not sure exactly why   Past Surgical History:  Procedure Laterality Date  .  BACK SURGERY    . BREAST BIOPSY Left 08/2015  . BREAST LUMPECTOMY WITH RADIOACTIVE SEED LOCALIZATION Left 11/23/2015   Procedure: LEFT BREAST LUMPECTOMY WITH RADIOACTIVE SEED LOCALIZATION;  Surgeon: Fanny Skates, MD;  Location: [place];  Service: General;  Laterality: Left;  . CARDIAC CATHETERIZATION  2013  . CHOLECYSTECTOMY OPEN    . CORONARY ANGIOPLASTY WITH STENT PLACEMENT  2013   12/09/11 95% stenosis proximal RCA->DES; otherwise no significant CAD  . EYE SURGERY     cataract right eye   . GASTRIC RESTRICTION SURGERY  1974  .  JOINT REPLACEMENT Right    knee  . MAXIMUM ACCESS (MAS)POSTERIOR LUMBAR INTERBODY FUSION (PLIF) 1 LEVEL N/A 08/18/2015   Procedure: Lumbar four-five Maximum access posterior lumbar interbody fusion;  Surgeon: Erline Levine, MD;  Location: [place] [facility];  Service: Neurosurgery;  Laterality: N/A;  L4-5 Maximum access posterior lumbar interbody fusion  . REPLACEMENT TOTAL KNEE Right 2007  . TONSILLECTOMY    . TOTAL HIP ARTHROPLASTY Right 11/01/2016   Procedure: RIGHT TOTAL HIP ARTHROPLASTY ANTERIOR APPROACH;  Surgeon: Mcarthur Rossetti, MD;  [facility];  Service: Orthopedics;  Laterality: Right;  . TOTAL KNEE ARTHROPLASTY Left 02/28/2017   Procedure: LEFT TOTAL KNEE ARTHROPLASTY;  Surgeon: Mcarthur Rossetti, MD;  [facility];  Service: Orthopedics;  Laterality: Left;     Current Meds  Medication Sig  . aspirin EC 81 MG tablet Take by mouth.  Marland Kitchen atorvastatin (LIPITOR) 40 MG tablet Take 40 mg by mouth daily.   . carisoprodol (SOMA) 350 MG tablet Take 350 mg by mouth as needed.  . cholecalciferol (VITAMIN D) 1000 UNITS tablet Take 1,000 Units by mouth daily.  . DULoxetine (CYMBALTA) 60 MG capsule Take 60 mg by mouth 2 (two) times daily.  . furosemide (LASIX) 20 MG tablet Take 1 tablet (20 mg total) by mouth daily.  . hydrOXYzine (ATARAX/VISTARIL) 25 MG tablet Take 25 mg by mouth 2 (two) times a day.   . levothyroxine (SYNTHROID, LEVOTHROID) 50 MCG tablet Take 50 mcg by mouth daily before breakfast.   . losartan (COZAAR) 25 MG tablet Take 1 tablet (25 mg total) by mouth daily.  . metFORMIN (GLUCOPHAGE-XR) 500 MG 24 hr tablet Take 500 mg by mouth 2 (two) times daily.  . metoprolol tartrate (LOPRESSOR) 25 MG tablet Take 25 mg by mouth 2 (two) times daily.   . nitroGLYCERIN (NITROSTAT) 0.4 MG SL tablet Place 0.4 mg under the tongue every 5 (five) minutes as needed for chest pain. Reported on 10/09/2015  . omeprazole (PRILOSEC) 20 MG capsule Take 20 mg by mouth daily before breakfast.     . potassium chloride (K-DUR) 10 MEQ tablet Take 10 mEq by mouth daily.   Marland Kitchen topiramate (TOPAMAX) 50 MG tablet Take 50 mg by mouth at bedtime.  . traZODone (DESYREL) 100 MG tablet Take 100 mg by mouth at bedtime.     Allergies:   Oxycodone, Sulfa antibiotics, and Venlafaxine   Social History   Tobacco Use  . Smoking status: Never Smoker  . Smokeless tobacco: Never Used  Substance Use Topics  . Alcohol use: No    Alcohol/week: 0.0 standard drinks  . Drug use: No     Family Hx: The patient's family history includes Bipolar disorder in her daughter and daughter; Cancer in her brother; Heart disease in her sister; Stomach cancer in her mother; Thyroid disease in her daughter and grandchild.  ROS:   Please see the history of present illness.  All other systems reviewed and are negative.   Prior CV studies:   The following studies were reviewed today:  Echo 01/14/19 1. The left ventricle has normal systolic function with an ejection  fraction of 60-65%. The cavity size was normal. Left ventricular diastolic  Doppler parameters are consistent with impaired relaxation.  2. The right ventricle has normal systolic function. The cavity was  normal.  3. The mitral valve is grossly normal. There is mild mitral annular  calcification present.  4. The tricuspid valve is grossly normal.  5. The aortic valve is tricuspid. Mild thickening of the aortic valve.  Aortic valve regurgitation is moderate by color flow Doppler. No stenosis  of the aortic valve.  6. Normal LV function; mild diastolic dysfunction; sclerotic aortic valve  with moderate AI.  Labs/Other Tests and Data Reviewed:    EKG:  No ECG reviewed.  Recent Labs: 01/13/2019: ALT 8; Hemoglobin 13.2; Platelets 223; TSH 3.000 10/29/2019: BUN 16; Creatinine, Ser 0.95; Potassium 4.5; Sodium 140   Recent Lipid Panel Lab Results  Component Value Date/Time   CHOL 137 01/13/2019 02:40 PM   TRIG 166 (H) 01/13/2019 02:40 PM    HDL 43 01/13/2019 02:40 PM   CHOLHDL 3.2 01/13/2019 02:40 PM   LDLCALC 61 01/13/2019 02:40 PM    Wt Readings from Last 3 Encounters:  11/26/19 260 lb (117.9 kg)  11/15/19 260 lb (117.9 kg)  10/29/19 258 lb (117 kg)     Objective:    Vital Signs:  BP (!) 158/68   Pulse 72   Ht 5\' 4"  (1.626 m)   Wt 260 lb (117.9 kg)   BMI 44.63 kg/m    VITAL SIGNS:  reviewed GEN:  no acute distress RESPIRATORY:  respirations unlabored NEURO:  alert and oriented x 3, no obvious focal deficit PSYCH:  normal affect  ASSESSMENT & PLAN:    Hypertension - on 25 mg losartan, lopressor 25 mg BID - BP is well-controlled per her log, high today but states this is abnormal for her - obtain BMP at PCP lab draw to check renal function and potassium after starting losartan - patient has received lab slip in the mail    CAD with prior stent to the RCA (2013) - continue ASA - no chest pain   Hyperlipidemia 01/13/2019: Cholesterol, Total 137; HDL 43; LDL Calculated 61; Triglycerides 166 - at goal, continue statin - recheck lipids annually   OSA on BiPAP - was able to get her a mask to use to show compliance - getting more rest at home, likely also helping her BP     COVID-19 Education: The signs and symptoms of COVID-19 were discussed with the patient and how to seek care for testing (follow up with PCP or arrange E-visit).  The importance of social distancing was discussed today.  Time:   Today, I have spent 16 minutes with the patient with telehealth technology discussing the above problems.     Medication Adjustments/Labs and Tests Ordered: Current medicines are reviewed at length with the patient today.  Concerns regarding medicines are outlined above.   Tests Ordered: No orders of the defined types were placed in this encounter.   Medication Changes: No orders of the defined types were placed in this encounter.   Follow Up:  In Person in 1 month(s) - already scheduled with Dr.  Claiborne Billings  Signed, [place], [place]  11/26/2019 10:37 AM    [place]

## 2019-11-26 ENCOUNTER — Telehealth (INDEPENDENT_AMBULATORY_CARE_PROVIDER_SITE_OTHER): Payer: Medicare Other | Admitting: Physician Assistant

## 2019-11-26 ENCOUNTER — Encounter: Payer: Self-pay | Admitting: Physician Assistant

## 2019-11-26 VITALS — BP 158/68 | HR 72 | Ht 64.0 in | Wt 260.0 lb

## 2019-11-26 DIAGNOSIS — Z955 Presence of coronary angioplasty implant and graft: Secondary | ICD-10-CM

## 2019-11-26 DIAGNOSIS — I10 Essential (primary) hypertension: Secondary | ICD-10-CM

## 2019-11-26 DIAGNOSIS — Z7189 Other specified counseling: Secondary | ICD-10-CM

## 2019-11-26 DIAGNOSIS — E785 Hyperlipidemia, unspecified: Secondary | ICD-10-CM | POA: Diagnosis not present

## 2019-11-26 DIAGNOSIS — G4733 Obstructive sleep apnea (adult) (pediatric): Secondary | ICD-10-CM | POA: Diagnosis not present

## 2019-11-26 DIAGNOSIS — I25119 Atherosclerotic heart disease of native coronary artery with unspecified angina pectoris: Secondary | ICD-10-CM

## 2019-11-26 DIAGNOSIS — I2510 Atherosclerotic heart disease of native coronary artery without angina pectoris: Secondary | ICD-10-CM

## 2019-11-26 DIAGNOSIS — I5189 Other ill-defined heart diseases: Secondary | ICD-10-CM

## 2019-11-26 DIAGNOSIS — Z9989 Dependence on other enabling machines and devices: Secondary | ICD-10-CM

## 2019-11-26 NOTE — Patient Instructions (Signed)
Medication Instructions:  The current medical regimen is effective;  continue present plan and medications as directed. Please refer to the Current Medication list given to you today. *If you need a refill on your cardiac medications before your next appointment, please call your pharmacy*  Follow-Up: Your next appointment:  KEEP SCHEDULED APPOINTMENT 12/15/2019  AT Time:4:40 PM [place] office vist  WITH  Shelva Majestic, MD.  At [facility], you and your health needs are our priority.  As part of our continuing mission to provide you with exceptional heart care, we have created designated Provider Care Teams.  These Care Teams include your primary Cardiologist (physician) and Advanced Practice Providers (APPs -  Physician Assistants and Nurse Practitioners) who all work together to provide you with the care you need, when you need it.

## 2019-12-14 ENCOUNTER — Telehealth: Payer: Self-pay | Admitting: Cardiovascular Disease

## 2019-12-14 NOTE — Telephone Encounter (Signed)
Sent message to scheduling to switch to VV tomorrow

## 2019-12-14 NOTE — Telephone Encounter (Signed)
New Message    Pt is calling about her sleep appt with Dr Claiborne Billings. She says she has no transportation and is wondering if this visit can be done virtually     Please advise

## 2019-12-14 NOTE — Telephone Encounter (Signed)
Called patient she said due to lack of transportation- she is wondering if she can have this visit done virtual-  10-1 is the only time she would be able to go come if she had to come in. I advised that it looks like the appointment was cancelled already but we could try to get her back in if possible.   Will route to nurse to advise.

## 2019-12-15 ENCOUNTER — Telehealth (INDEPENDENT_AMBULATORY_CARE_PROVIDER_SITE_OTHER): Payer: Medicare Other | Admitting: Cardiovascular Disease

## 2019-12-15 ENCOUNTER — Ambulatory Visit: Payer: Medicare Other | Admitting: Cardiovascular Disease

## 2019-12-15 VITALS — Ht 64.0 in | Wt 260.0 lb

## 2019-12-15 DIAGNOSIS — I5189 Other ill-defined heart diseases: Secondary | ICD-10-CM

## 2019-12-15 DIAGNOSIS — E785 Hyperlipidemia, unspecified: Secondary | ICD-10-CM | POA: Diagnosis not present

## 2019-12-15 DIAGNOSIS — I10 Essential (primary) hypertension: Secondary | ICD-10-CM | POA: Diagnosis not present

## 2019-12-15 DIAGNOSIS — G4733 Obstructive sleep apnea (adult) (pediatric): Secondary | ICD-10-CM

## 2019-12-15 DIAGNOSIS — E039 Hypothyroidism, unspecified: Secondary | ICD-10-CM

## 2019-12-15 DIAGNOSIS — Z6841 Body Mass Index (BMI) 40.0 and over, adult: Secondary | ICD-10-CM

## 2019-12-15 MED ORDER — LOSARTAN POTASSIUM 50 MG PO TABS: 50.0000 mg | ORAL_TABLET | Freq: Every day | ORAL | 3 refills | Status: DC

## 2019-12-15 NOTE — Progress Notes (Signed)
Virtual Visit via Video Note   This visit type was conducted due to national recommendations for restrictions regarding the COVID-19 Pandemic (e.g. social distancing) in an effort to limit this patient's exposure and mitigate transmission in our community.  Due to her co-morbid illnesses, this patient is at least at moderate risk for complications without adequate follow up.  This format is felt to be most appropriate for this patient at this time.  All issues noted in this document were discussed and addressed.  A limited physical exam was performed with this format.  Please refer to the patient's chart for her consent to telehealth for [facility].   The patient was identified using 2 identifiers.  Date:  12/22/2019   ID:  Dawn Davidson, DOB 1950/12/12, MRN [id number]  Patient Location: Home Provider Location: Office  PCP:  Jonathon Jordan, MD  Cardiologist:  Buford Dresser, MD Corky Downs MD, sleep) Electrophysiologist:  None   Evaluation Performed:  Follow-Up Visit  Chief Complaint: Follow-up sleep evaluation  History of Present Illness:    Dawn Davidson is a 69 y.o. female who is originally from [place] and moved to the [place] area over the last several years. She has a history of morbid obesity and in the past her peak weight was 398 pounds. She subsequently lost approximately 130 pounds. She has known history of hypertension, hyperlipidemia, and coronary artery disease and suffered a myocardial infarction in 2010. Her cardiology care is been done in [place]. In 2013 a DES stent was placed to her RCA at the [facility]. At that time she denied chest pain but experience shortness of breath and fatigue. She has established her primary care with Dr. [name]. She had seen Dr. Harrell Gave on one occasion for cardiology evaluation of shortness of breath. She admitted to being under significant amount of stress. She has been caring  for her 54 year old grandson who lives with her. She also has a roommate. She admits to being under significant stress. She has a history of fibromyalgia. Due to conference earns for obstructive sleep apnea she was referred for a sleep study which was done on April 12, 2018. Symptoms included fatigue, morning headaches, snoring, witnessed apnea, and gasping during sleep. Her initial Epworth Sleepiness Scale score was 10. She was found to have at least moderate to moderately severe overall sleep apnea with an AHI of 27.1/h and RDI of 39.9/h. She was unable to achieve any rem sleep on her diagnostic study. Oxygen nadir with non-REM sleep was 88%. She was referred for CPAP and ultimate BiPAP titration on June 21, 2018. During the initial part of the study the AHI was 30.8/h. CPAP was initiated at 5 and was titrated to 11. BiPAP was initiated at 12/8 and increased to 16/12. She has now been on BiPAP therapy. Her DME company is [employer]. A download was achieved from October 13, 2018 through November 11, 2018. Usage was 100% 4 days used. However, average usage was only 4 hours and 15 minutes. She would only sleep for short duration and typically would use CPAP 2-3 times per day. Her sleep pattern is very poor. Typically she cannot go to sleep even though she tries going to sleep sometime around midnight. However she basically states she sleeps from 7:30 AM until 10 AM then goes back to sleep for some time thereafter. On her download, her average CPAP device pressure 90% of the time was 15.9 and although she is supposed to have a pressure support  of 4 it appears that her average device pressure 90% of the time was 16.3. Upon further review of interrogation it appears that her pressure support which should have been at 4 was set as a maximum of 4 and a minimum of 0 so most of the time she has not been truly on BiPAP therapy. As result, AHI was still elevated at 11.3. However she feels she is  sleeping better since initiating therapy. A new Epworth Sleepiness Scale score was calculated in the office today and this endorsed at 2.  I saw her for initial evaluation on November 12, 2018.  She denied any current chest pain symptomatology.  After interrogating her BiPAP unit, I made changes to her BiPAP setting up with a pressure support minimum of 4 up to a maximum of 6.  She also had difficulty with her mask which was contributing to a significant leak.  I recommended a new ResMed air fit F 30 mask medium size rather than small.  She just received her new mask but has not yet tried it.  I had a lengthy discussion with her regarding the effects of untreated sleep apnea on sleep architecture, as well as cardiovascular health with association with hypertension, nocturnal arrhythmias, atrial fibrillation, potential risk for MI and stroke while sleeping if associated with significant hypoxemia. I also discussed its potential implications with GERD as well as glucose regulation. I discussed with her the importance of optimal sleep duration.  She had extremely poor sleep hygiene.  She also was on medications with potentially could interfere with her ability to achieve REM sleep.  I discussed the importance of additional weight loss.   She was last evaluated in a telemedicine encounter in May 2020.  Over the previous  several months, she  felt improved and was sleeping better.  I obtained a download from March 12 through Jan 20, 2019.  She is married meeting compliance standards with 100% of usage days.  Usage greater than 4 hours is 75.4%.  Her AHI was 9.5, with an apnea index of 2.5 and hypopnea index 4.8.  90% therapy was 19.7/15.4.  She did have moderate leak at 44.6 L/min this was with her old mask.  Since I initially saw her, she underwent an echo Doppler study which showed an EF of 60 to 65% and evidence for at least grade 1 diastolic dysfunction.  There was aortic valve sclerosis with moderate aortic  insufficiency.  From a cardiac perspective, she denies any recurrent chest pain. She admits to some shortness of breath. She is unaware of palpitations. She denies PND orthopnea.   During her evaluation I made slight adjustment with her pressure support minimum of 5 up to a maximum of 7.  She received a new medium size mask.  Since I last saw her, she has not been successful with weight loss.  BMI today was 44.63.  I was able to obtain a download from September 16, 2019 through December 14, 2019.  Compliance is poor with usage only 11 of 90 days and average usage at 3 hours and 39 minutes.  AHI is 8.2.  She has required high pressures with 90% pressure at 19.5/15.  Decently she states that her sleep duration has improved.  However she can go to bed at 10 PM.  Oftentimes she wakes up around 2 AM and if she cannot fall back to sleep she gets up in its and eventually goes back to bed.  She states recently her sleep duration has  improved to approximately 5 to 6 hours.  Her blood pressure has been elevated in the 1 58-1 60 range.  She is using [employer] as her DME company but is not satisfied.  She presents for evaluation  The patient does not have symptoms concerning for COVID-19 infection (fever, chills, cough, or new shortness of breath).    Past Medical History:  Diagnosis Date  . Anxiety   . Arthritis    "neck, back, hands, knees, hips" (08/18/2015)  . 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 Chronic lower back pain   . Coronary artery disease    2010 MI  . Depression   . Family history of adverse reaction to anesthesia    her mother gets sick  . GERD (gastroesophageal reflux disease)   . Hyperlipidemia   . Hypertension   . Hypothyroidism since 02/2015  . Macular degeneration, bilateral   . Migraine    "q couple weeks recently" (08/18/2015)  . Morbid obesity ([place]) 05/10/2015  . Myocardial infarction ([place]) 2012  . Temporary low platelet count ([place]) 2012   checked every 6  mths...kept her in for 1 week with MI, and she's not sure exactly why   Past Surgical History:  Procedure Laterality Date  . BACK SURGERY    . BREAST BIOPSY Left 08/2015  . BREAST LUMPECTOMY WITH RADIOACTIVE SEED LOCALIZATION Left 11/23/2015   Procedure: LEFT BREAST LUMPECTOMY WITH RADIOACTIVE SEED LOCALIZATION;  Surgeon: Fanny Skates, MD;  Location: [place];  Service: General;  Laterality: Left;  . CARDIAC CATHETERIZATION  2013  . CHOLECYSTECTOMY OPEN    . CORONARY ANGIOPLASTY WITH STENT PLACEMENT  2013   12/09/11 95% stenosis proximal RCA->DES; otherwise no significant CAD  . EYE SURGERY     cataract right eye   . GASTRIC RESTRICTION SURGERY  1974  . JOINT REPLACEMENT Right    knee  . MAXIMUM ACCESS (MAS)POSTERIOR LUMBAR INTERBODY FUSION (PLIF) 1 LEVEL N/A 08/18/2015   Procedure: Lumbar four-five Maximum access posterior lumbar interbody fusion;  Surgeon: Erline Levine, MD;  Location: [place] [facility];  Service: Neurosurgery;  Laterality: N/A;  L4-5 Maximum access posterior lumbar interbody fusion  . REPLACEMENT TOTAL KNEE Right 2007  . TONSILLECTOMY    . TOTAL HIP ARTHROPLASTY Right 11/01/2016   Procedure: RIGHT TOTAL HIP ARTHROPLASTY ANTERIOR APPROACH;  Surgeon: Mcarthur Rossetti, MD;  Location: [facility];  Service: Orthopedics;  Laterality: Right;  . TOTAL KNEE ARTHROPLASTY Left 02/28/2017   Procedure: LEFT TOTAL KNEE ARTHROPLASTY;  Surgeon: Mcarthur Rossetti, MD;  Location: [facility];  Service: Orthopedics;  Laterality: Left;     Current Meds  Medication Sig  . aspirin EC 81 MG tablet Take by mouth.  Marland Kitchen atorvastatin (LIPITOR) 40 MG tablet Take 40 mg by mouth daily.   . carisoprodol (SOMA) 350 MG tablet Take 350 mg by mouth as needed.  . cholecalciferol (VITAMIN D) 1000 UNITS tablet Take 1,000 Units by mouth daily.  . DULoxetine (CYMBALTA) 60 MG capsule Take 60 mg by mouth 2 (two) times daily.  . furosemide (LASIX) 20 MG tablet Take 1 tablet (20 mg total) by mouth daily.  .  hydrOXYzine (ATARAX/VISTARIL) 25 MG tablet Take 25 mg by mouth 2 (two) times a day.   . levothyroxine (SYNTHROID, LEVOTHROID) 50 MCG tablet Take 50 mcg by mouth daily before breakfast.   . losartan (COZAAR) 50 MG tablet Take 1 tablet (50 mg total) by mouth daily.  . metoprolol tartrate (LOPRESSOR) 25 MG tablet Take 25 mg by  mouth 2 (two) times daily.   . nitroGLYCERIN (NITROSTAT) 0.4 MG SL tablet Place 0.4 mg under the tongue every 5 (five) minutes as needed for chest pain. Reported on 10/09/2015  . omeprazole (PRILOSEC) 20 MG capsule Take 20 mg by mouth daily before breakfast.   . pioglitazone (ACTOS) 15 MG tablet Take 15 mg by mouth daily.  . potassium chloride (K-DUR) 10 MEQ tablet Take 10 mEq by mouth daily.   Marland Kitchen topiramate (TOPAMAX) 50 MG tablet Take 50 mg by mouth at bedtime.  . traZODone (DESYREL) 100 MG tablet Take 100 mg by mouth at bedtime.  . [DISCONTINUED] losartan (COZAAR) 25 MG tablet Take 1 tablet (25 mg total) by mouth daily.     Allergies:   Oxycodone, Sulfa antibiotics, and Venlafaxine   Social History   Tobacco Use  . Smoking status: Never Smoker  . Smokeless tobacco: Never Used  Substance Use Topics  . Alcohol use: No    Alcohol/week: 0.0 standard drinks  . Drug use: No     Family Hx: The patient's family history includes Bipolar disorder in her daughter and daughter; Cancer in her brother; Heart disease in her sister; Stomach cancer in her mother; Thyroid disease in her daughter and grandchild.  ROS:   Please see the history of present illness.    No fevers chills or night sweats. No cough No shortness of breath. No chest pain Blood pressure elevation Positive for fibromyalgia and arthritis No significant edema All other systems reviewed and are negative.   Prior CV studies:   The following studies were reviewed today:  I was able to obtain a most recent download from [facility] from September 16, 2019 through December 14, 2019.  BiPAP set up date July 28, 2018.  Labs/Other Tests and Data Reviewed:    EKG: Since this was a telemedicine visit no ECG was done today but I personally reviewed her ECG from November 12, 2018 which showed sinus rhythm with first-degree AV block with a PR interval at 212 ms.  Low voltage.  No ectopy.  Recent Labs: 01/13/2019: ALT 8; Hemoglobin 13.2; Platelets 223; TSH 3.000 10/29/2019: BUN 16; Creatinine, Ser 0.95; Potassium 4.5; Sodium 140   Recent Lipid Panel Lab Results  Component Value Date/Time   CHOL 137 01/13/2019 02:40 PM   TRIG 166 (H) 01/13/2019 02:40 PM   HDL 43 01/13/2019 02:40 PM   CHOLHDL 3.2 01/13/2019 02:40 PM   LDLCALC 61 01/13/2019 02:40 PM    Wt Readings from Last 3 Encounters:  12/15/19 260 lb (117.9 kg)  11/26/19 260 lb (117.9 kg)  11/15/19 260 lb (117.9 kg)     Objective:    Vital Signs:  Ht 5\' 4"  (1.626 m)   Wt 260 lb (117.9 kg)   BMI 44.63 kg/m    Since this was a virtual visit I could not physically examine the patient.  Breathing was normal and not labored.  There was no audible wheezing.  When she took her pulse her heart rhythm was regular and not irregular.  There was no chest discomfort to palpation.  She denied abdominal pain.  There was no swelling.  ASSESSMENT & PLAN:    1. Obstructive sleep apnea: I reviewed her most recent download.  We again discussed optimal sleep duration at 7 to 8 hours.  I discussed the adverse consequences of sleep apnea if untreated from a cardiovascular perspective.  She now states that she is sleeping 5 to 6 hours per night more recently.  She feels that she is not getting good service from her current DME company.  I have suggested changing to [facility].  She is having difficulty with her mask and I have suggested changing her mask to a ResMed air fit F30 i mask and reviewed the benefits of this new mask style. 2. Central hypertension.  Recent blood pressure recordings have been 1 58-1 60.  I am increasing losartan to 50 mg  daily. 3. Diastolic dysfunction.  Her echo Doppler data shows normal systolic function with grade 1 diastolic dysfunction most likely contributed by her hypertension.  There is also evidence for aortic sclerosis with moderate aortic insufficiency. 4. Morbid obesity.  BMI today continues to be excessive.  I discussed the importance of weight loss and exercise both with reference to her blood pressure control cardiovascular benefit as well as sleep apnea benefit. 5. Hyperlipidemia.  She continues to be on statin 40 mg daily.  Target LDL less than 70 in this patient with established CAD.  She is tolerating this well. 6. Thyroid: Currently on thyroid replacement.  COVID-19 Education: The signs and symptoms of COVID-19 were discussed with the patient and how to seek care for testing (follow up with PCP or arrange E-visit).  The importance of social distancing was discussed today.  Time:   Today, I have spent 31 minutes with the patient with telehealth technology discussing the above problems.     Medication Adjustments/Labs and Tests Ordered: Current medicines are reviewed at length with the patient today.  Concerns regarding medicines are outlined above.   Tests Ordered: No orders of the defined types were placed in this encounter.   Medication Changes: Meds ordered this encounter  Medications  . losartan (COZAAR) 50 MG tablet    Sig: Take 1 tablet (50 mg total) by mouth daily.    Dispense:  90 tablet    Refill:  3    Follow Up: 6 months in office.  Signed, Shelva Majestic, MD  12/22/2019 7:39 PM    [place]

## 2019-12-15 NOTE — Patient Instructions (Signed)
Medication Instructions:  INCREASE LOSARTAN TO 50MG  DAILY *If you need a refill on your cardiac medications before your next appointment, please call your pharmacy*   Follow-Up: At [facility], you and your health needs are our priority.  As part of our continuing mission to provide you with exceptional heart care, we have created designated Provider Care Teams.  These Care Teams include your primary Cardiologist (physician) and Advanced Practice Providers (APPs -  Physician Assistants and Nurse Practitioners) who all work together to provide you with the care you need, when you need it.  We recommend signing up for the patient portal called "MyChart".  Sign up information is provided on this After Visit Summary.  MyChart is used to connect with patients for Virtual Visits (Telemedicine).  Patients are able to view lab/test results, encounter notes, upcoming appointments, etc.  Non-urgent messages can be sent to your provider as well.   To learn more about what you can do with MyChart, go to [email or web address].    Your next appointment:   6 month(s)  The format for your next appointment:   In Person  Provider:   Shelva Majestic, MD

## 2019-12-22 ENCOUNTER — Encounter: Payer: Self-pay | Admitting: Cardiovascular Disease

## 2020-01-27 ENCOUNTER — Telehealth: Payer: Medicare Other | Admitting: Cardiovascular Disease

## 2020-02-03 ENCOUNTER — Other Ambulatory Visit: Payer: Self-pay | Admitting: Family Medicine

## 2020-02-03 DIAGNOSIS — Z1231 Encounter for screening mammogram for malignant neoplasm of breast: Secondary | ICD-10-CM

## 2020-02-04 ENCOUNTER — Other Ambulatory Visit: Payer: Self-pay | Admitting: Family Medicine

## 2020-02-04 DIAGNOSIS — E2839 Other primary ovarian failure: Secondary | ICD-10-CM

## 2020-03-08 ENCOUNTER — Telehealth: Payer: Self-pay | Admitting: Cardiovascular Disease

## 2020-03-08 NOTE — Telephone Encounter (Signed)
Patient would like to speak to Mariann Laster, she said she needs to order supplies for her cpap machine. She said Dr. Claiborne Billings was going to change her to another company. Can leave detailed message on VM.

## 2020-03-14 ENCOUNTER — Other Ambulatory Visit: Payer: Self-pay | Admitting: Family Medicine

## 2020-03-14 DIAGNOSIS — M48061 Spinal stenosis, lumbar region without neurogenic claudication: Secondary | ICD-10-CM

## 2020-04-09 ENCOUNTER — Ambulatory Visit
Admission: RE | Admit: 2020-04-09 | Discharge: 2020-04-09 | Disposition: A | Discharge status: Home / Self Care | Payer: Medicare Other | Source: Ambulatory Visit | Attending: Family Medicine | Admitting: Family Medicine

## 2020-04-09 ENCOUNTER — Other Ambulatory Visit: Payer: Self-pay

## 2020-04-09 DIAGNOSIS — M48061 Spinal stenosis, lumbar region without neurogenic claudication: Secondary | ICD-10-CM

## 2020-04-20 ENCOUNTER — Telehealth (INDEPENDENT_AMBULATORY_CARE_PROVIDER_SITE_OTHER): Payer: Medicare Other | Admitting: Cardiology

## 2020-04-20 ENCOUNTER — Telehealth: Payer: Self-pay | Admitting: Cardiology

## 2020-04-20 VITALS — BP 130/60 | Temp 97.3°F | Ht 63.0 in | Wt 267.0 lb

## 2020-04-20 DIAGNOSIS — I10 Essential (primary) hypertension: Secondary | ICD-10-CM | POA: Diagnosis not present

## 2020-04-20 DIAGNOSIS — I25119 Atherosclerotic heart disease of native coronary artery with unspecified angina pectoris: Secondary | ICD-10-CM | POA: Diagnosis not present

## 2020-04-20 DIAGNOSIS — E785 Hyperlipidemia, unspecified: Secondary | ICD-10-CM | POA: Diagnosis not present

## 2020-04-20 DIAGNOSIS — E1169 Type 2 diabetes mellitus with other specified complication: Secondary | ICD-10-CM | POA: Diagnosis not present

## 2020-04-20 DIAGNOSIS — E669 Obesity, unspecified: Secondary | ICD-10-CM

## 2020-04-20 MED ORDER — DAPAGLIFLOZIN PROPANEDIOL 10 MG PO TABS
10.0000 mg | ORAL_TABLET | Freq: Every day | ORAL | Status: DC
Start: 2020-04-20 — End: 2020-05-12

## 2020-04-20 NOTE — Telephone Encounter (Signed)
Noted-samples to be placed at front desk  Medication samples have been provided to the patient.  Drug name: Amanda Cockayne: 2 boxes   LOT: [id number]  Exp.Date: 03/23

## 2020-04-20 NOTE — Telephone Encounter (Signed)
Patient states she was just advised by Dr. Harrell Gave to come back to the office to pick up her medication. However, patient states due to issues with her back she is sending her friend, Ellison Hughs, to pick it up. She states he will be able to come by the office around 12:00 PM.

## 2020-04-20 NOTE — Progress Notes (Signed)
Virtual Visit via Telephone Note   This visit type was conducted due to national recommendations for restrictions regarding the COVID-19 Pandemic (e.g. social distancing) in an effort to limit this patient's exposure and mitigate transmission in our community.  Due to her co-morbid illnesses, this patient is at least at moderate risk for complications without adequate follow up.  This format is felt to be most appropriate for this patient at this time.  The patient did not have access to video technology/had technical difficulties with video requiring transitioning to audio format only (telephone).  All issues noted in this document were discussed and addressed.  No physical exam could be performed with this format.  Please refer to the patient's chart for her  consent to telehealth for [facility].    Date:  04/20/2020   ID:  Dawn Davidson, DOB 03-03-1951, MRN [id number] The patient was identified using 2 identifiers.  Patient Location: Home Provider Location: Home Office  PCP:  Jonathon Jordan, MD  Cardiologist:  Buford Dresser, MD  Electrophysiologist:  None   Evaluation Performed:  Follow-Up Visit  Chief Complaint:  Follow up  History of Present Illness:    Dawn Davidson is a 69 y.o. female with a hx of hypertension, hyperlipidemia, coronary artery disease with MI in 2010 s/p DES to RCA in 2013, morbid obesity, and hypothyroidism who is seen for follow up today.   Cardiac history: She had a prior admission in 04/2017 for acute diastolic heart failure. She is concerned because her prior heart attack symptoms did not involve chest pain and were atypical (headaches and vague symptoms). Was told she was having a heart attack in 2007 but didn't get a stent. Had a stent in [place] at [facility] in 2013 for a nearly complete narrowing of a coronary vessel (requested records, note below from [facility]). Her symptoms were shortness of breath and fatigue at that  time.  Was seen by Dr. Claiborne Billings for sleep apnea, but now doesn't need to be seen routinely for her sleep apnea. Would prefer to follow with a female provider long term, will follow up with me.  Today: Overall doing well. Biggest issue is a pinched nerve in her back, has limited her activity level, can barely walk. Sleeping much better with CPAP. Breathing is much better overall.  Checks occasional blood pressures at home. Not very frequent.  Reviewed med list. She had pioglitazone in her most recent medications, last A1c 7.8 per report. Has only taken a few doses of pioglitazone. Discussed Coordinate-DM trial, she is amenable. Was written for Jardiance, but it was very expensive for her. We discussed SGLT2i and GLP1RA as well.  The patient does not have symptoms concerning for COVID-19 infection (fever, chills, cough, or new shortness of breath).   Denies chest pain, shortness of breath at rest or with normal exertion. No PND, orthopnea, LE edema or unexpected weight gain. No syncope or palpitations.  Past Medical History:  Diagnosis Date  . Anxiety   . Arthritis    "neck, back, hands, knees, hips" (08/18/2015)  . 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 Chronic lower back pain   . Coronary artery disease    2010 MI  . Depression   . Family history of adverse reaction to anesthesia    her mother gets sick  . GERD (gastroesophageal reflux disease)   . Hyperlipidemia   . Hypertension   . Hypothyroidism since 02/2015  . Macular degeneration, bilateral   .  Migraine    "q couple weeks recently" (08/18/2015)  . Morbid obesity ([place]) 05/10/2015  . Myocardial infarction ([name]) 2012  . Temporary low platelet count ([name]) 2012   checked every 6 mths...kept her in for 1 week with MI, and she's not sure exactly why   Past Surgical History:  Procedure Laterality Date  . BACK SURGERY    . BREAST BIOPSY Left 08/2015  . BREAST LUMPECTOMY WITH RADIOACTIVE SEED LOCALIZATION Left  11/23/2015   Procedure: LEFT BREAST LUMPECTOMY WITH RADIOACTIVE SEED LOCALIZATION;  Surgeon: Fanny Skates, MD;  Location: [facility];  Service: General;  Laterality: Left;  . CARDIAC CATHETERIZATION  2013  . CHOLECYSTECTOMY OPEN    . CORONARY ANGIOPLASTY WITH STENT PLACEMENT  2013   12/09/11 95% stenosis proximal RCA->DES; otherwise no significant CAD  . EYE SURGERY     cataract right eye   . GASTRIC RESTRICTION SURGERY  1974  . JOINT REPLACEMENT Right    knee  . MAXIMUM ACCESS (MAS)POSTERIOR LUMBAR INTERBODY FUSION (PLIF) 1 LEVEL N/A 08/18/2015   Procedure: Lumbar four-five Maximum access posterior lumbar interbody fusion;  Surgeon: Erline Levine, MD;  Location: [facility];  Service: Neurosurgery;  Laterality: N/A;  L4-5 Maximum access posterior lumbar interbody fusion  . REPLACEMENT TOTAL KNEE Right 2007  . TONSILLECTOMY    . TOTAL HIP ARTHROPLASTY Right 11/01/2016   Procedure: RIGHT TOTAL HIP ARTHROPLASTY ANTERIOR APPROACH;  Surgeon: Mcarthur Rossetti, MD;  Location: [facility];  Service: Orthopedics;  Laterality: Right;  . TOTAL KNEE ARTHROPLASTY Left 02/28/2017   Procedure: LEFT TOTAL KNEE ARTHROPLASTY;  Surgeon: Mcarthur Rossetti, MD;  Location: [facility];  Service: Orthopedics;  Laterality: Left;     Current Meds  Medication Sig  . aspirin EC 81 MG tablet Take by mouth.  Marland Kitchen atorvastatin (LIPITOR) 40 MG tablet Take 40 mg by mouth daily.   . carisoprodol (SOMA) 350 MG tablet Take 350 mg by mouth as needed.  . cholecalciferol (VITAMIN D) 1000 UNITS tablet Take 1,000 Units by mouth daily.  . DULoxetine (CYMBALTA) 60 MG capsule Take 60 mg by mouth 2 (two) times daily.  . furosemide (LASIX) 20 MG tablet Take 1 tablet (20 mg total) by mouth daily.  . hydrOXYzine (ATARAX/VISTARIL) 25 MG tablet Take 25 mg by mouth 2 (two) times a day.   . levothyroxine (SYNTHROID, LEVOTHROID) 50 MCG tablet Take 50 mcg by mouth daily before breakfast.   . losartan (COZAAR) 50 MG tablet Take 1 tablet (50 mg  total) by mouth daily.  . metoprolol tartrate (LOPRESSOR) 25 MG tablet Take 25 mg by mouth 2 (two) times daily.   . nitroGLYCERIN (NITROSTAT) 0.4 MG SL tablet Place 0.4 mg under the tongue every 5 (five) minutes as needed for chest pain. Reported on 10/09/2015  . omeprazole (PRILOSEC) 20 MG capsule Take 20 mg by mouth daily before breakfast.   . pioglitazone (ACTOS) 15 MG tablet Take 15 mg by mouth daily.  . potassium chloride (K-DUR) 10 MEQ tablet Take 10 mEq by mouth daily.   Marland Kitchen topiramate (TOPAMAX) 50 MG tablet Take 50 mg by mouth at bedtime.  . traZODone (DESYREL) 100 MG tablet Take 100 mg by mouth at bedtime.     Allergies:   Oxycodone, Sulfa antibiotics, and Venlafaxine   Social History   Tobacco Use  . Smoking status: Never Smoker  . Smokeless tobacco: Never Used  Substance Use Topics  . Alcohol use: No    Alcohol/week: 0.0 standard drinks  . Drug use:  No     Family Hx: The patient's family history includes Bipolar disorder in her daughter and daughter; Cancer in her brother; Heart disease in her sister; Stomach cancer in her mother; Thyroid disease in her daughter and grandchild.  ROS:   Please see the history of present illness.    All other systems reviewed and are negative.  Prior CV studies:   The following studies were reviewed today: Echo 01/14/19 1. The left ventricle has normal systolic function with an ejection  fraction of 60-65%. The cavity size was normal. Left ventricular diastolic  Doppler parameters are consistent with impaired relaxation.  2. The right ventricle has normal systolic function. The cavity was  normal.  3. The mitral valve is grossly normal. There is mild mitral annular  calcification present.  4. The tricuspid valve is grossly normal.  5. The aortic valve is tricuspid. Mild thickening of the aortic valve.  Aortic valve regurgitation is moderate by color flow Doppler. No stenosis  of the aortic valve.  6. Normal LV function; mild  diastolic dysfunction; sclerotic aortic valve  with moderate AI.   Cath report from [facility] (12/09/2011) Coronary Findings: Dominance and General Appearance Right dominant with Single Vessel CAD involving the RCA Left Main Coronary Artery No significant Left Main lesions were identified Left Anterior Descending Artery No significant Left Anterior Descending Artery lesions were identified. Left Circumflex Artery No significant lesions in Circumflex Artery were identified. Right Coronary Artery Complex 95% proximal lesion in RCA Left to right collaterals were noted  Comments: Using above hardware and Angiomax for anticoagulation, lesion was crossed with fielder wire (Pro-Water and PT graphix were unable to cross). using over the wire balloon (1.25 mm) lesion in mid RCA was pre dilated with that as well as a 2 mm balloon. Then a 3 x 28 mm DES per study protocol (EVOLVE-2) was deployed by Dr. Ennis Forts. Lesion was post dilated with 3 and 3.25 mm Beecher balloon resulting in 0% residual and TIMI 3 distal flow. All distal branches were accounted for. Conclusion: Diagnostic Results: Single Vessel CAD involving the RCA  Coronary Intervention Results: PCI Indication: Standard PCI Indications Procedural Success: Complete Successful PTCA/ Stenting- RCA to 0 % w/ DES  Vascular Access Management: Pulled, TR band applied to the right wrist  Had stress test here in 05/2015 but was feeling generally well at the time, but was preop for back surgery and couldn't walk. Study Highlights    Nuclear stress EF: 65%.  The left ventricular ejection fraction is normal (55-65%).  No T wave inversion was noted during stress.  There was no ST segment deviation noted during stress.  This is a low risk study.  No reversible perfusion defects. LVEF 65%. Normal wall motion. This is a low risk study.     Labs/Other Tests and Data Reviewed:    EKG:  An ECG dated 03/12/2018 was personally  reviewed today and demonstrated:  NSR, PRWP  Recent Labs: 10/29/2019: BUN 16; Creatinine, Ser 0.95; Potassium 4.5; Sodium 140   Recent Lipid Panel Lab Results  Component Value Date/Time   CHOL 137 01/13/2019 02:40 PM   TRIG 166 (H) 01/13/2019 02:40 PM   HDL 43 01/13/2019 02:40 PM   CHOLHDL 3.2 01/13/2019 02:40 PM   LDLCALC 61 01/13/2019 02:40 PM    Wt Readings from Last 3 Encounters:  04/20/20 267 lb (121.1 kg)  12/15/19 260 lb (117.9 kg)  11/26/19 260 lb (117.9 kg)     Objective:  Vital Signs:  BP 130/60   Temp (!) 97.3 F (36.3 C)   Ht 5\' 3"  (1.6 m)   Wt 267 lb (121.1 kg)   BMI 47.30 kg/m    Speaking comfortably on the phone, no audible wheezing In no acute distress Alert and oriented Normal affect Normal speech  ASSESSMENT & PLAN:    CAD with RCA stent in 2013 Hyperlipidemia: -continue atorvastatin 40 mg daily -LDL is 59, goal <70 -on aspirin 81 mg -given diabetes, discussed SGLT2i and GLP1RA today. Main concern is cost  Hypertension -at goal -continue losartan, metoprolol, furosemide  Type II diabetes -we discussed SGLT2i today as well as Coordinate-Diabetes trial -will give SGLT2i samples today and see if we can make this affordable for her -stop pioglitazone with start of SGLT2i -continue aspirin, statin as above  Morbid obesity -working on weight loss  Follow up in 2 mos  COVID-19 Education: The signs and symptoms of COVID-19 were discussed with the patient and how to seek care for testing (follow up with PCP or arrange E-visit).  The importance of social distancing was discussed today.  Time:   Today, I have spent 20 minutes with the patient with telehealth technology discussing the above problems.     Medication Adjustments/Labs and Tests Ordered: Current medicines are reviewed at length with the patient today.  Concerns regarding medicines are outlined above.   Tests Ordered: No orders of the defined types were placed in this  encounter.   Medication Changes: Meds ordered this encounter  Medications  . DISCONTD: dapagliflozin propanediol (FARXIGA) 10 MG TABS tablet    Sig: Take 1 tablet (10 mg total) by mouth daily before breakfast.    Dispense:  30 tablet   Patient Instructions  Medication Instructions:  Start Farxiga 10mg  tablet.  Stop taking pioglitazone when you start the [place] .   *If you need a refill on your cardiac medications before your next appointment, please call your pharmacy*   Lab Work: None Ordered At This Time.   If you have labs (blood work) drawn today and your tests are completely normal, you will receive your results only by: Marland Kitchen MyChart Message (if you have MyChart) OR . A paper copy in the mail If you have any lab test that is abnormal or we need to change your treatment, we will call you to review the results.   Testing/Procedures: None Ordered At This Time.    Follow-Up: At [facility], you and your health needs are our priority.  As part of our continuing mission to provide you with exceptional heart care, we have created designated Provider Care Teams.  These Care Teams include your primary Cardiologist (physician) and Advanced Practice Providers (APPs -  Physician Assistants and Nurse Practitioners) who all work together to provide you with the care you need, when you need it.  We recommend signing up for the patient portal called "MyChart".  Sign up information is provided on this After Visit Summary.  MyChart is used to connect with patients for Virtual Visits (Telemedicine).  Patients are able to view lab/test results, encounter notes, upcoming appointments, etc.  Non-urgent messages can be sent to your provider as well.   To learn more about what you can do with MyChart, go to [email or web address].    Your next appointment:   2 month(s)  The format for your next appointment:   In Person or virtual   Provider:   Buford Dresser, MD   Other  Instructions   Signed, Blondine Hottel  Harrell Gave, MD  04/20/2020 8:27 AM    [facility]

## 2020-04-20 NOTE — Patient Instructions (Signed)
Medication Instructions:  Start Farxiga 10mg  tablet.  Stop taking pioglitazone when you start the [place] .   *If you need a refill on your cardiac medications before your next appointment, please call your pharmacy*   Lab Work: None Ordered At This Time.   If you have labs (blood work) drawn today and your tests are completely normal, you will receive your results only by: Marland Kitchen MyChart Message (if you have MyChart) OR . A paper copy in the mail If you have any lab test that is abnormal or we need to change your treatment, we will call you to review the results.   Testing/Procedures: None Ordered At This Time.    Follow-Up: At [facility], you and your health needs are our priority.  As part of our continuing mission to provide you with exceptional heart care, we have created designated Provider Care Teams.  These Care Teams include your primary Cardiologist (physician) and Advanced Practice Providers (APPs -  Physician Assistants and Nurse Practitioners) who all work together to provide you with the care you need, when you need it.  We recommend signing up for the patient portal called "MyChart".  Sign up information is provided on this After Visit Summary.  MyChart is used to connect with patients for Virtual Visits (Telemedicine).  Patients are able to view lab/test results, encounter notes, upcoming appointments, etc.  Non-urgent messages can be sent to your provider as well.   To learn more about what you can do with MyChart, go to [email or web address].    Your next appointment:   2 month(s)  The format for your next appointment:   In Person or virtual   Provider:   Buford Dresser, MD   Other Instructions

## 2020-04-23 DIAGNOSIS — E119 Type 2 diabetes mellitus without complications: Secondary | ICD-10-CM

## 2020-04-23 DIAGNOSIS — I25119 Atherosclerotic heart disease of native coronary artery with unspecified angina pectoris: Secondary | ICD-10-CM

## 2020-04-24 ENCOUNTER — Ambulatory Visit: Payer: Medicare Other

## 2020-04-24 ENCOUNTER — Other Ambulatory Visit: Payer: Medicare Other

## 2020-04-25 ENCOUNTER — Telehealth: Payer: Self-pay | Admitting: *Deleted

## 2020-04-25 DIAGNOSIS — Z006 Encounter for examination for normal comparison and control in clinical research program: Secondary | ICD-10-CM

## 2020-04-25 NOTE — Telephone Encounter (Signed)
Called pt regarding Coordinated DM study. Pt stated "nows not a good time. Can you please call me back at 4:00pm today?" I will call pt back today around 4:00pm, to discuss Coordinate DM study.

## 2020-04-25 NOTE — Telephone Encounter (Signed)
1627 LVM for pt to call me back regarding Coordinate DM study.

## 2020-04-26 ENCOUNTER — Telehealth: Payer: Self-pay | Admitting: *Deleted

## 2020-04-26 DIAGNOSIS — Z006 Encounter for examination for normal comparison and control in clinical research program: Secondary | ICD-10-CM

## 2020-04-26 NOTE — Telephone Encounter (Signed)
Subject Name: Dawn Davidson [facility]  Subject met inclusion and exclusion criteria.  The informed consent form, study requirements and expectations were reviewed with the subject and questions and concerns were addressed prior to the signing of the consent form.  The subject verbalized understanding of the trial requirements.  The subject agreed to participate in the Coordinate DM trial and signed the informed consent at 0928 on 04/26/2020 The informed consent was obtained prior to performance of any protocol-specific procedures for the subject.  A copy of the signed informed consent was given to the subject and a copy was placed in the subject's medical record.    Consent was obtained verbally a copy of the informed consent was mailed to patients address on file.   Dawn Davidson

## 2020-05-12 ENCOUNTER — Ambulatory Visit: Payer: Medicare Other | Admitting: Cardiology

## 2020-05-12 MED ORDER — EMPAGLIFLOZIN 10 MG PO TABS: 10.0000 mg | ORAL_TABLET | Freq: Every day | ORAL | 11 refills | Status: DC

## 2020-05-15 NOTE — Telephone Encounter (Signed)
Samples placed up front for pick up.  Jardiance 10 mg Qty: 3 boxes Lot # [id number] Exp: 6/23

## 2020-05-31 ENCOUNTER — Telehealth: Payer: Self-pay | Admitting: Cardiovascular Disease

## 2020-05-31 NOTE — Telephone Encounter (Signed)
Patient called about supplies referral sent Aprid.  She states it hasn't be sent.  She hasn't had any supplies for the past two months.

## 2020-06-02 ENCOUNTER — Telehealth: Payer: Self-pay | Admitting: *Deleted

## 2020-06-02 NOTE — Telephone Encounter (Signed)
Transfer order for BIPAP supplies and maintenance  with records faxed to [facility] per patient request.

## 2020-06-02 NOTE — Telephone Encounter (Signed)
Returned a call to patient. I informed her that she had previously spoken with Oliv[a, who is no longer here. I asked if her  Plan is to transfer her CPAP care from [place] to [place]. She says yes. I informed her that I will have to do a complete transfer, not just send order for new mask and supplies. Patient voiced understanding and is aware I will do referral if I have all of the needed documentation.

## 2020-06-09 ENCOUNTER — Encounter: Payer: Self-pay | Admitting: Cardiology

## 2020-07-04 NOTE — Telephone Encounter (Signed)
Patient reports malfunctioning BIPAP. Reports hours are not counting and bloody nose while wearing BIPAP.   Please address.  Marland Kitchen

## 2020-07-31 ENCOUNTER — Ambulatory Visit: Payer: Medicare Other | Admitting: Cardiovascular Disease

## 2020-08-09 ENCOUNTER — Other Ambulatory Visit: Payer: Self-pay | Admitting: Family Medicine

## 2020-08-09 ENCOUNTER — Ambulatory Visit
Admission: RE | Admit: 2020-08-09 | Discharge: 2020-08-09 | Disposition: A | Discharge status: Home / Self Care | Payer: Medicare Other | Source: Ambulatory Visit | Attending: Family Medicine | Admitting: Family Medicine

## 2020-08-09 ENCOUNTER — Other Ambulatory Visit: Payer: Self-pay

## 2020-08-09 DIAGNOSIS — E2839 Other primary ovarian failure: Secondary | ICD-10-CM

## 2020-08-09 DIAGNOSIS — M25559 Pain in unspecified hip: Secondary | ICD-10-CM

## 2020-08-09 DIAGNOSIS — Z1231 Encounter for screening mammogram for malignant neoplasm of breast: Secondary | ICD-10-CM

## 2020-08-09 DIAGNOSIS — G8929 Other chronic pain: Secondary | ICD-10-CM

## 2020-08-10 ENCOUNTER — Encounter: Payer: Self-pay | Admitting: *Deleted

## 2020-08-10 ENCOUNTER — Telehealth: Payer: Self-pay | Admitting: *Deleted

## 2020-08-10 DIAGNOSIS — Z006 Encounter for examination for normal comparison and control in clinical research program: Secondary | ICD-10-CM

## 2020-08-10 NOTE — Research (Signed)
I spoke to patient for 35-month Coordinate-Diabetes Study. Patient has not been hospitalized. Assessed patients medications. Patient works with Dr. Stephanie Acre for diabetes management.Last hemoglobin A1C was 10. Patient is working on her diet. I will call patient back in 3 months for follow-up.

## 2020-08-10 NOTE — Telephone Encounter (Signed)
I called patient for 73-month Coordinate-Diabetes Study. I left message for patient to call me back.

## 2020-08-16 NOTE — Research (Signed)
COORDINATE-Diabetes 3 Month Patient Questionnaire (Intervention) Site #:   202   Patient ID: [id number]    3 MONTH QUESTIONNAIRE  Visit Date 08/10/2020  Vital Status [x]   Patient Alive > Proceed to Visit Status []   Patient Dead > Complete Death Form only []   Unknown > Proceed to Visit Status  Visit Status Was the interview completed? []  No >IF NO, Select reason why [] Unable to locate                                    [] No Valid Contacts (patients or alternates) [] Multiple attempts to valid contacts []  Patient no longer cared for at study clinic []  Patient withdrew []  Other, specify:                                           >IF NO, Last date of contact: / /             MM      DD        YYYY    [x] Yes >IF YES, Select source of Interview: []   Proxy [x]   Patient     3 Month Patient Questionnaire (Intervention)  Instructions:   1. Prior to speaking with the patient either over the phone or in person, print off or have available the patient's current list of medications.      IF conducting follow-up visit over the phone - Instruct the patient to gather all their pill bottles. 2. For the purpose of the COORDINATE-Diabetes Study, please document whether the patient is currently taking each of the following medication classes. 3. DO NOT PROMPT DURING FOLLOW-UP VISIT: IF subject mentions reaction to one of the following medications, complete the AE/SAE section and follow instructions in operations manual regarding Adverse event reporting to [employer] ([facility]).  MEDICATIONS  Medication Are you currently taking [insert name of previous med]? If currently taking: If started since last visit: If stopped since last visit:  ACE Inhibitor / Angiotensin Receptor Blocker (ARB) / Angiotensin Receptor Neprilysin inhibitor (ARNi) [] No >  [x] Yes > [] Stopped since last visit [] Never prescribed [] Started since last visit [x] Same medication as last     visit [] Different medication than        last visit [Study personnel to answer]  Documented in EHR? [] No  [x] Yes   If no, Verified by: [] Photo of bottle [] Copy of rx [] Dispensing       pharmacy [] Prescribing provider [] Other (specify):                    Date started:        / /             MM DD YYYY Who prescribed this medication for you? [] Cardiology provider > [] Study clinic [] Outside clinic [] Endocrinology provider [] Primary care provider [] Other provider > Specify:                             [] Unknown Date discontinued:        / /             MM DD YYYY Why did you stop taking this medication? (check all that apply) [] Allergic reaction [] Medication side effects [] Unable to       adhere/monitor []   Had an      operation/procedure      that required      stopping it [] Unable to afford it [] No longer wants        to take        this medication [] Provider decision [] Pregnancy [] Other (specify: ) [] Unknown Reason   If started or changed  What medication are you taking now? [] Benazepril (Lotensin) [] Captopril (Capoten) [] Enalapril (Vasotec) [] Fosinopril (Monopril) [] Lisinopril (Zestril, Prinivil) [] Quinapril (Accupril) [] Ramipril (Altace) [] Azilsartan (Edarbi) [] Candesartan (Atacand) [] Irbesartan (Avapro) [] Losartan (Cozaar) [] Olmesartan (Benicar) [] Telmisartan (Micardis) [] Valsartan (Diovan) [] Sacrubitril/Valsartan Delene Loll)      Medication Are you currently taking [insert name of previous med]? If currently taking: If started since last visit: If stopped since last visit:  Statin []  No >  [x] Yes > [] Stopped since last visit [] Never prescribed [] Started since last visit [x] Same medication as last        visit [] Different medication or dose     than last visit [Study personnel to answer]  Documented in EHR? [] No [x] Yes    If no, Verified by: [] Photo of bottle [] Copy of rx [] Dispensing pharmacy [] Prescribing provider [] Other (specify):                    Date  started:        / /             MM DD YYYY Who prescribed this medication for you? [] Cardiology provider  [] Study clinic [] Outside clinic [] Endocrinology provider [] Primary care provider [] Other provider  Specify:                             [] Unknown Date discontinued:        / /             MM DD YYYY Why did you stop taking this medication? (check all that apply) [] Allergic reaction [] Medication side effects  [] Muscle aches [] Weakness [] Joint pain [] Cognitive symptoms [] Other (specify):                          [] Unable to        adhere/monitor [] Had an    operation/procedure    that required stopping it [] Unable to afford it [] No longer wants       to take this            medication [] Provider decision [] Pregnancy [] Other (specify: ) [] Unknown Reason   If started or changed  What medication are you taking now? [] Atorvastatin (Lipitor) [] Fluvastatin (Lescol) [] Lovastatin (Mevacor) [] Pravastatin (Pravachol) [] Rosuvastatin (Crestor) [] Simvastatin (Zocor) [] Pitatavastatin (Livalo)  Dose: [] 1 mg  [] 10 mg [] 2 mg  []  20 mg [] 3 mg  []  40 mg [] 4 mg  []  60 mg [] 5 mg  []  80 mg  Frequency: [] Daily []  Less than daily      Medication Are you currently taking [insert name of previous med]? If currently taking: If started since last visit: If stopped since last visit:  SGLT2 Inhibitor [] No   [x] Yes [] Stopped since last visit [] Never prescribed [] Started since last visit [] Same medication as last      visit [x] Different medication than last visit [Study personnel to answer]  Documented in EHR? [] No [x] Yes   If no, Verified by: [] Photo of bottle [] Copy of rx [] Dispensing       pharmacy [] Prescribing       provider [] Other (specify):  Date started: 09 /10/ 2021 MM DD YYYY Who prescribed this medication for you? [x] Cardiology provider  [] Study clinic [] Outside clinic [] Endocrinology provider [] Primary care  provider [] Other provider  Specify:                             [] Unknown Date discontinued:        / /             MM DD YYYY Why did you stop taking this medication? (check all that apply) [] Allergic reaction [] Medication side effects [] Unable to       adhere/monitor [] Had an        operation/procedure         that required           stopping it [] Patient unable to       afford it [] Patient no longer       wants to       take this medication [] Provider decision [] Pregnancy [] Other (specify: ) [] Unknown Reason   If started or changed  What medication are you taking now? [] Canaglifozin (Invokana) [] Dapagliflozin (Farxiga) [x] Empaglifozin (Jardiance) [] Ertugliflozin (Steglatro)     GLP1 Receptor Agonist [x] No   [] Yes [] Stopped since last visit [x] Never prescribed [] Started since last visit [] Same medication as last      visit [] Different medication than       last visit [Study personnel to answer]  Documented in EHR? [] No [] Yes   If no, Verified by: [] Photo of bottle [] Copy of rx [] Dispensing       pharmacy [] Prescribing       provider [] Other (specify):                    Date started:        / /             MM DD YYYY Who prescribed this medication for you? [] Cardiology provider  [] Study clinic [] Outside clinic [] Endocrinology provider [] Primary care provider [] Other provider  Specify:                             [] Unknown Date discontinued:        / /             MM DD YYYY Why did you stop taking this medication? (check all that apply) [] Allergic reaction [] Medication side effects [] Unable to      adhere/monitor [] Had an    operation/procedure    that required stopping it [] Patient unable to        afford it [] Patient no longer        wants to take this        medication [] Provider decision [] Pregnancy [] Other (specify: ) [] Unknown Reason   If started or changed  What medication are you taking now? [] Albiglutide  (Tanzeum) [] Dulaglutide (Trulicity) [] Exanatide (Byetta, Bydureon) [] Liraglutide (Victoza, Saxenda) [] Lixisenatide (Adlyxin) [] Semaglutice (Ozempic)     11/10/2018 (EDC Release)

## 2020-09-21 ENCOUNTER — Ambulatory Visit: Payer: HMO

## 2020-10-11 ENCOUNTER — Telehealth: Payer: Self-pay

## 2020-10-11 DIAGNOSIS — Z006 Encounter for examination for normal comparison and control in clinical research program: Secondary | ICD-10-CM

## 2020-10-11 NOTE — Telephone Encounter (Signed)
COORDINATE-Diabetes 6 and 12 Month Patient Questionnaire                     Site #:    204      Patient ID: [id number]  1 and 12 Month Patient Questionnaire  Visit Date: 10/11/2020    Site tracking only:  $Remo'[]'yLDXn$   6 month   '[]'$  12 month  Vital Status $RemoveBefo'[x]'fbldtbCUdch$  Patient Alive  > Proceed to Visit Status   '[]'$  Patient Dead >  Complete Death Form only  $Rem'[]'qrHO$  Unknown  > Proceed to Visit Status   Visit Status Was the interview completed?   '[]'$  No  ? IF NO, Select reason why  $Re'[]'QCL$   Unable to locate  $Remov'[]'CvUKOh$   No Valid Contacts (patients or        alternates)  $RemoveBefo'[]'jGNAYBxcZoh$   Multiple attempts to valid contacts $RemoveBefore'[]'XFzHmuXNFImTQ$  Patient no longer cared for at study       clinic  $Remov'[]'fitZws$   Patient withdrew  $RemoveB'[]'LnrmOjOg$   Other, specify: __________________                                                                                                                                                                                                                                                                  IF NO, Last date of contact: ___/___/_____                                                MM DD YYYY   '[x]'$   Yes ? IF YES, Select source of Interview:             '[]'$   Proxy             '[]'$   Patient    COORDINATE-Diabetes 6 Month CASE REPORT FORM (Intervention) -EHR Site #:   [id number]              Patient ID:           [id number]     0 UVOZD EHR REVIEW  Medical Record Check Date 10/11/2020   Vital Status $RemoveBefo'[x]'CtZUMqYMTFL$ Patient Alive >Date last known alive per EHR:   '[]'$   Patient Dead >> Complete Death Form  [] Unknown   CLINICAL EVENTS / PROCEDURES  Hospitalization since last visit? (>=24 hour stay) [x] No [] Yes  >> if yes, Complete the following  Date of hospital admission:        / /             MM DD YYYY  Primary discharge diagnosis:  *Complete appropriate event validation form [] acute myocardial infarction (heart attack)* [] stroke* [] heart failure* [] coronary revascularization* [] peripheral revascularization* [] cerebral revascularization* [] diabetes (e.g.  hypoglycemia, DKA) [] renal failure [] amputation [] other cardiovascular reason [] other NON-cardiovascular reason [] unknown  Other diagnoses not documented above: (check all that apply)  *Complete appropriate event validation form [] acute myocardial infarction (heart attack)* [] stroke* [] heart failure* [] coronary revascularization* [] peripheral revascularization* [] cerebral revascularization* [] diabetes (e.g. hypoglycemia, DKA) [] renal failure [] amputation [] other cardiovascular reason [] other NON-cardiovascular reason [] unknown  Were any of the following outpatient procedures done since the last visit? (I.e. procedures not captured above)  Coronary revascularization [x] No [] Yes >IF YES, Date / /             MM DD YYYY  Peripheral revascularization [x] No [] Yes > IF YES, Date / /             MM DD YYYY  Cerebral revascularization [x] No [] Yes > IF YES, Date / /             MM DD YYYY  Extremity amputation    [x] No [] Yes >IF YES, Date / /             MM      DD       YYYY  Renal replacement therapy (i.e. dialysis)    [x] No [] Yes > IF YES, Date of initiation / /             MM      DD       YYYY   **EDC will allow for collection of multiple hospitalizations and procedures   MEDICATIONS  Medication Currently Prescribed? If started since last visit: If not started since last visit: If stopped since last visit:  Cardiac Medications  ACE Inhibitor / Angiotensin Receptor Blocker (ARB) / Angiotensin Receptor Neprilysin inhibitor (ARNi)  [] No >  [x] Yes > Since last visit, medication was: [] Stopped [] Not started [] Started [x] Continued same medication [] Continued with medication changes Date started:        / /             MM DD YYYY Who prescribed? [] Cardiology provider  [] Study clinic [] Outside clinic [] Endocrinology provider [] Primary care provider [] Other provider  Specify:                             [] Unknown Reason (check all that apply): [] History of swelling  around lips, eyes or face [] Feeling dizzy/lightheaded [] Low blood pressure [] Poor or fluctuating kidney function [] High potassium [] Patient has experienced other side effects to this medication  before [] Patient will be unable to adhere/monitor [] Patient unable to afford it [] Patient does not want to      take this medication [] Pregnancy [] Other (specify: ) [] Unknown Reason Date discontinued:        / /             MM DD YYYY Reason (check all that apply): [] Swelling around lips, eyes       or face [] Feeling dizzy/lightheaded [] Low blood pressure [] Poor or fluctuating kidney function [] High potassium [] Other medication side  effects [] Patient unable to        adhere/monitor [] Had an       operation/procedure that        required stopping it [] Patient unable to afford it [] Patient no longer wants       to take this medication [] Pregnancy [] Other (specify: ) [] Unknown Reason   If started or changed  Medication Name: [] Benazepril (Lotensin) [] Captopril (Capoten) [] Enalapril (Vasotec) [] Fosinopril (Monopril) [] Lisinopril (Zestril, Prinivil) [] Quinapril (Accupril) [] Ramipril (Altace) [] Azilsartan (Edarbi) [] Candesartan (Atacand) [] Irbesartan (Avapro) [] Losartan (Cozaar) [] Olmesartan (Benicar) [] Telmisartan (Micardis) [] Valsartan (Diovan) [] Sacrubitril/Valsartan (Entresto)     Beta Blocker [x] No []  Yes > [] Acebutolol (Sectral) [] Bisoprolol (Zebeta) [] Carvedilol (Coreg) [] Labetalol (Trandate,      Normodyne) [] Metoprolol succinate (Toprol) [x] Metoprolol tartrate       (Lopressor) [] Nadolol (Corgard) [] Nebivolol (Bystolic) [] Propranolol (Inderal) [] Sotalol (Betapace)     Medication Currently Prescribed? If started since last visit: If not started since last visit: If stopped since last visit:  Aldosterone Antagonist [x] No [] Yes > [] Amiloride [] Eplerenone (Inspra) [] Spirinolactone (Aldactone) [] Traimterene (Dyrenium)    Calcium Channel Blocker [x] No []  Yes > Medication Name: [] Amlodipine (Norvasc) [] Diltiazem (Cardizem) [] Felodipine (Plendil) [] Nifedipine (Procardia) [] Verapamil (Calan)   Diuretic Loop [] No [x]  Yes > Medication Name: [] Bumetanide (Bumex) [] Ethacrynic acid (Edecrin) [x] Furosemide (Lasix) [] Torsemide (Demadex)   Diuretic Thiazide- type [x] No []  Yes > Medication Name: [] Chlorothiazide      [] Chlorthalidone [] Hydrochlorothiazide [] Indapamide [] Metolazone   Anticoagulation Therapy (other than Warfarin) [x] No []  Yes > Medication Name: [] Apixaban (Eliquis) [] Edoxaban (Lixiana) [] Rivaroxaban Alen Blew) [] Dabigatran (Redaxa)   Warfarin [x] No []  Yes    Antiplatelet Agent (including aspirin) [] No [x]  Yes > Medication Name (check all that apply): [x] Aspirin [] Clopidogrel (Plavix) [] Prasugrel (Effient) [] Ticagrelor (Brilinta) [] Ticlopidine (Ticlid) [] Dipyridamole (Persantine)    Medication Currently Prescribed? If started since last visit: If not started since last visit: If stopped since last visit:  Statin  [] No >  [x] Yes > Since last visit, medication was: [] Stopped [] Not started [] Started [] Continued same medication and dose [] Continued with dose or medication changes Date started:        / /             MM DD YYYY Who prescribed? [] Cardiology provider [] Endocrinology provider [] Primary care provider [] Other provider  Specify:                             [] Unknown Reason (check all that apply): [] History of Rhabdomyolysis [] LDL-cholesterol already       <70 [] Muscle      aches/pain/weakness [] Mental       fogginess/memory loss [] Liver dysfunction [] Patient has  experienced other side   effects to this medication before [] Patient will be unable to adhere/monitor [] Patient unable to afford it [] Patient does not want to take this medication [] Pregnancy [] Other (specify: ) [] Unknown Reason Date discontinued:        / /             MM DD  YYYY Reason (check all that apply): [] Rhabdomyolysis [] Muscle aches/pain/weakness [] Mental fogginess/memory loss [] Liver dysfunction [] Other medication side effects [] Patient unable to          adhere/monitor [] Patient unable to afford it [] Patient no longer wants to take       this medication [] Pregnancy [] Other (specify: ) [] Unknown Reason   If started or changed  Medication Name: [] Atorvastatin (Lipitor) [] Fluvastatin (Lescol) [] Lovastatin (Mevacor) [] Pravastatin (Pravachol) [] Rosuvastatin (Crestor) [] Simvastatin (Zocor) [] Pitatavastatin (Livalo)  Dose: [] 1 mg [] 10 mg [] 2 mg []  20  mg [] 3 mg []  40 mg [] 4 mg []  60 mg [] 5 mg []  80 mg  Frequency: [] Daily  [] Less than daily      Does the patient have statin intolerance that prevents the use of maximum dose of high potency statin? [x] No [] Yes >IF YES, Complete Statin Intolerance form   Non-statin lipid lowering therapy [x] No [] Yes > Medication Name (check all that apply): [] Colesevelam (Welchol) [] Ezetimibe (Zetia) [] Fibrate [] Niacin [] PCSK9 inhibitor [] Omega 3 acid ethyl esters (Lovaza) [] Icosapent Ethyl (Vascepa) [] Over the counter omega 3 fatty acid or fish oil supplement     Medication Currently Prescribed? If started since last visit: If not started since last visit: If stopped since last visit:  Diabetes Medications  SGLT2 Inhibitor  [] No >  [x] Yes > Since last visit, medication was: [x] Stopped [] Not started [] Started [x] Continued same medication [] Continued with medication changes Date started:        / /             MM DD YYYY Who prescribed? [] Cardiology provider  [] Study clinic [] Outside clinic [] Endocrinology      provider [] Primary care provider [] Other provider  Specify:                             [] Unknown Reason (check all that apply): [] eGFR <45 [] HbA1c<7% on metformin monotherapy OR already on GLP1RA and do not need to start another anti- hyperglycemic [] Already  dehydrated [] Low blood pressure [] High risk of Hypoglycemia [] Prior DKA [] Recurrent mycotic genital infections [] History of or at risk for amputation [] Patient has experienced other side effects to this medication before [] Patient will be unable to adhere/monitor [] Patient unable to afford it [] Patient does not want to take this medication [] Pregnancy [] Other (specify: ) [] Unknown Reason Date discontinued:        / /             MM DD YYYY Reason (check all that apply  [] eGFR now <45 [] Dehydration [] Low blood pressure [] Hypoglycemia [] DKA [] Mycotic genital infection [] Amputation [] Other medication side effects [] Patient unable    to adhere/monitor  [] Had an operation/procedure     that required stopping it [] Patient unable to afford it [] Patient no longer wants to      take this medication [] Pregnancy [] Other (specify: ) [] Unknown Reason   If started or changed  Medication Name: [] Canaglifozin (Invokana) [] Dapagliflozin Wilder Glade) [] Empaglifozin (Jardiance) [] Ertugliflozin Actuary)           Medication Currently Prescribed? If started since last visit: If not started since last visit: If stopped since last visit:  GLP1 Receptor Agonist  [] No >  [x] Yes > Since last visit, medication was: [] Stopped [] Not started [x] Started [] Continued same medication [] Continued with medication changes Date started: 08/30/2021 MM DD YYYY Who prescribed? [] Cardiology provider  [] Study clinic [] Outside clinic [] Endocrinology       provider [] Primary care provider [] Other provider > Specify:                             [x] Unknown Reason (check all that apply): [] Personal or family history of medullary thyroid cancer [] MEN2 [] HbA1c<7% on metformin monotherapy OR already on SGLT2i and do not need to start another anti-hyperglycemic [] eGFR now <30 [] High risk of Hypoglycemia [] History of pancreatitis [] Significant gastroparesis [] Prior gastric  surgery [] Patient has experienced other side effects to this medication before [] Patient will be unable to adhere/monitor [] Patient unable to afford it [] Patient does not  want to take this medication [] Pregnancy [] Other (specify: ) [] Unknown Reason Date discontinued:        / /             MM DD YYYY Reason (check all that apply): [] Medullary thyroid cancer [] MEN2 [] eGFR now <30 [] Hypoglycemia [] Pancreatitis [] Significant gastroparesis [] Gastric surgery [] Other medication side       effects []   Patient  unable    to adhere/monitor                                  [] Had an operation/procedure that required stopping it [] Patient unable to afford it [] Patient no longer wants to take this medication [] Pregnancy [] Other (specify: ) [] Unknown Reason   If started or changed > Medication Name: [] Albiglutide (Tanzeum) [] Dulaglutide (Trulicity) [] Exanatide (Byetta, Bydureon) [] Liraglutide (Victoza, Saxenda) [] Lixisenatide (Adlyxin) [x] Semaglutice (Ozempic)      Medication Currently Prescribed? If started since last visit: If not started since last visit: If stopped since last visit:  Other non Insulin diabetes medications [x] No [] Yes > Medication Name (check all that apply): [] Acarbose (Precose) [] Miglitol (Glyset) [] Glimepiride (Amaryl) [] Glipizide (Amaryl) [] Glyburide (Diabeta,       Glynase,   Micronase) [] Metformin (Fortamet,        Glucophage[including XR],        Glumetza, Riomet) [] Pioglitazone (Actos) [] Nateglinide (Starlix) [] Pramlintide (Symilin) [] Repaglinide (Prandin) [] Rosiglitazone (Avandia) [] Alogliptin (Nesina) [] Linagliptin (Tradjenta) [] Saxagliptin (Onglyza) [] Sitagliptin (Januvia) [] Bromocriptine Quick Release (Cycloset)     Insulin [x] No []  Yes > total daily dose: units     STATIN INTOLERANCE (PER EHR/OTHER SOURCE DATA)  1. Was CK checked? [] No [] Yes   >If yes, select from the following: [] CK not elevated [] CK elevated 1-5x upper limit  of normal [] CK elevated >5x upper limit of normal  2. Does the patient have muscle symptoms? [] No [] Yes    >If yes, select from the following: Location and pattern of muscle symptoms (select all that apply) [] Symmetric, hip flexors or thighs [] Symmetric, calves [] Symmetric, proximal upper extremity [] Asymmetric, intermittent, or not specific to any area [] Unknown   Timing of muscle symptom in relation to starting statin regimen [] <4 weeks [] 4-12 weeks [] >12 weeks [] Unknown   Timing of muscle symptoms improvement after withdrawal of statin [] <2 weeks [] 2-4 weeks [] No improvement after 4 weeks [] Unknown  3. Was patient re-challenged with a statin regimen (even if same statin compound or regimen as above)?  [] No  [] Yes  [] Unknown  >If yes, select from the following: Timing of recurrence of similar muscle symptoms in relation to starting second regimen [] <4 weeks [] 4-12 weeks [] >12 weeks [] Similar symptoms did not recur [] Unknown   [email or web address]

## 2020-11-01 ENCOUNTER — Ambulatory Visit: Payer: HMO

## 2020-12-17 ENCOUNTER — Other Ambulatory Visit: Payer: Self-pay | Admitting: Cardiovascular Disease

## 2020-12-18 NOTE — Telephone Encounter (Signed)
Rx has been sent to the pharmacy electronically. ° °

## 2020-12-20 ENCOUNTER — Ambulatory Visit
Admission: RE | Admit: 2020-12-20 | Discharge: 2020-12-20 | Disposition: A | Discharge status: Home / Self Care | Payer: HMO | Source: Ambulatory Visit | Attending: Family Medicine | Admitting: Family Medicine

## 2020-12-20 ENCOUNTER — Other Ambulatory Visit: Payer: Self-pay

## 2020-12-21 ENCOUNTER — Other Ambulatory Visit: Payer: Self-pay | Admitting: Family Medicine

## 2020-12-21 DIAGNOSIS — R928 Other abnormal and inconclusive findings on diagnostic imaging of breast: Secondary | ICD-10-CM

## 2021-01-08 ENCOUNTER — Ambulatory Visit
Admission: RE | Admit: 2021-01-08 | Discharge: 2021-01-08 | Disposition: A | Discharge status: Home / Self Care | Payer: HMO | Source: Ambulatory Visit | Attending: Family Medicine | Admitting: Family Medicine

## 2021-01-08 ENCOUNTER — Other Ambulatory Visit: Payer: Self-pay | Admitting: Family Medicine

## 2021-01-08 ENCOUNTER — Other Ambulatory Visit: Payer: Self-pay

## 2021-01-08 DIAGNOSIS — R928 Other abnormal and inconclusive findings on diagnostic imaging of breast: Secondary | ICD-10-CM

## 2021-01-08 DIAGNOSIS — R921 Mammographic calcification found on diagnostic imaging of breast: Secondary | ICD-10-CM

## 2021-01-10 DIAGNOSIS — Z006 Encounter for examination for normal comparison and control in clinical research program: Secondary | ICD-10-CM

## 2021-01-10 NOTE — Research (Addendum)
Marland KitchenMarland KitchenMarland KitchenMarland KitchenMarland KitchenMarland KitchenMarland Kitchen   COORDINATE-Diabetes 3 Month Patient Questionnaire (Intervention) Site #:   130   Patient ID: [id number]   7 MONTH QUESTIONNAIRE  Visit Date 01/10/21   Vital Status [x]   Patient Alive > Proceed to Visit Status []   Patient Dead > Complete Death Form only []   Unknown > Proceed to Visit Status  Visit Status Was the interview completed? []  No>IF NO, Select reason why [] Unable to locate                                    [] No Valid Contacts (patients or alternates) [] Multiple attempts to valid contacts []  Patient no longer cared for at study clinic []  Patient withdrew []  Other, specify:                                           >IF NO, Last date of contact: / /             MM      DD        YYYY    [x] Yes >IF YES, Select source of Interview: []   Proxy [x]   Patient     3 Month Patient Questionnaire (Intervention)  Instructions:   1. Prior to speaking with the patient either over the phone or in person, print off or have available the patient's current list of medications.      IF conducting follow-up visit over the phone - Instruct the patient to gather all their pill bottles. 2. For the purpose of the COORDINATE-Diabetes Study, please document whether the patient is currently taking each of the following medication classes. 3. DO NOT PROMPT DURING FOLLOW-UP VISIT: IF subject mentions reaction to one of the following medications, complete the AE/SAE section and follow instructions in operations manual regarding Adverse event reporting to [employer] ([facility]).  MEDICATIONS  Medication Are you currently taking [insert name of previous med]? If currently taking: If started since last visit: If stopped since last visit:  ACE Inhibitor / Angiotensin Receptor Blocker (ARB) / Angiotensin Receptor Neprilysin inhibitor (ARNi) [] No >  [x] Yes > [] Stopped since last visit [] Never prescribed [] Started since last visit [x] Same medication as last     visit [] Different medication than        last visit [Study personnel to answer]  Documented in EHR? [] No  [x] Yes   If no, Verified by: [] Photo of bottle [] Copy of rx [] Dispensing       pharmacy [] Prescribing provider [] Other (specify):                    Date started:        / /             MM DD YYYY Who prescribed this medication for you? [] Cardiology provider > [] Study clinic [] Outside clinic [] Endocrinology provider [] Primary care provider [] Other provider > Specify:                             [] Unknown Date discontinued:        / /             MM DD YYYY Why did you stop taking this medication? (check all that apply) [] Allergic reaction [] Medication side effects [] Unable to  adhere/monitor [] Had an      operation/procedure      that required      stopping it [] Unable to afford it [] No longer wants        to take        this medication [] Provider decision [] Pregnancy [] Other (specify: ) [] Unknown Reason   If started or changed  What medication are you taking now? [] Benazepril (Lotensin) [] Captopril (Capoten) [] Enalapril (Vasotec) [] Fosinopril (Monopril) [] Lisinopril (Zestril, Prinivil) [] Quinapril (Accupril) [] Ramipril (Altace) [] Azilsartan Earnest Rosier) [] Candesartan (Atacand) [] Irbesartan (Avapro) [x] Losartan (Cozaar) [] Olmesartan (Benicar) [] Telmisartan (Micardis) [] Valsartan (Diovan) [] Sacrubitril/Valsartan Delene Loll)      Medication Are you currently taking [insert name of previous med]? If currently taking: If started since last visit: If stopped since last visit:  Statin []  No >  [x] Yes > [] Stopped since last visit [] Never prescribed [] Started since last visit [x] Same medication as last        visit [] Different medication or dose     than last visit [Study personnel to answer]  Documented in EHR? [] No [x] Yes    If no, Verified by: [] Photo of bottle [] Copy of rx [] Dispensing pharmacy [] Prescribing provider [] Other (specify):                     Date started:        / /             MM DD YYYY Who prescribed this medication for you? [] Cardiology provider  [] Study clinic [] Outside clinic [] Endocrinology provider [] Primary care provider [] Other provider  Specify:                             [] Unknown Date discontinued:        / /             MM DD YYYY Why did you stop taking this medication? (check all that apply) [] Allergic reaction [] Medication side effects  [] Muscle aches [] Weakness [] Joint pain [] Cognitive symptoms [] Other (specify):                          [] Unable to        adhere/monitor [] Had an    operation/procedure    that required stopping it [] Unable to afford it [] No longer wants       to take this            medication [] Provider decision [] Pregnancy [] Other (specify: ) [] Unknown Reason   If started or changed  What medication are you taking now? [] Atorvastatin (Lipitor) [] Fluvastatin (Lescol) [] Lovastatin (Mevacor) [] Pravastatin (Pravachol) [] Rosuvastatin (Crestor) [] Simvastatin (Zocor) [] Pitatavastatin (Livalo)  Dose: [] 1 mg  [] 10 mg [] 2 mg  []  20 mg [] 3 mg  []  40 mg [] 4 mg  []  60 mg [] 5 mg  []  80 mg  Frequency: [] Daily []  Less than daily      Medication Are you currently taking [insert name of previous med]? If currently taking: If started since last visit: If stopped since last visit:  SGLT2 Inhibitor [] No   [x] Yes [] Stopped since last visit [] Never prescribed [] Started since last visit [x] Same medication as last      visit [] Different medication than last visit [Study personnel to answer]  Documented in EHR? [] No [x] Yes   If no, Verified by: [] Photo of bottle [] Copy of rx [] Dispensing       pharmacy [] Prescribing       provider [] Other (specify):  Date started:        / /             MM DD YYYY Who prescribed this medication for you? [] Cardiology provider  [] Study clinic [] Outside clinic [] Endocrinology provider [] Primary care  provider [] Other provider  Specify:                             [] Unknown Date discontinued:        / /             MM DD YYYY Why did you stop taking this medication? (check all that apply) [] Allergic reaction [] Medication side effects [] Unable to       adhere/monitor [] Had an        operation/procedure         that required           stopping it [] Patient unable to       afford it [] Patient no longer       wants to       take this medication [] Provider decision [] Pregnancy [] Other (specify: ) [] Unknown Reason   If started or changed  What medication are you taking now? [] Canaglifozin (Invokana) [] Dapagliflozin (Farxiga) [x] Empaglifozin (Jardiance) [] Ertugliflozin (Steglatro)     GLP1 Receptor Agonist [] No   [x] Yes [] Stopped since last visit [] Never prescribed [] Started since last visit [x] Same medication as last      visit [] Different medication than       last visit [Study personnel to answer]  Documented in EHR? [] No [x] Yes   If no, Verified by: [] Photo of bottle [] Copy of rx [] Dispensing       pharmacy [] Prescribing       provider [] Other (specify):                    Date started:        / /             MM DD YYYY Who prescribed this medication for you? [] Cardiology provider  [] Study clinic [] Outside clinic [] Endocrinology provider [] Primary care provider [] Other provider  Specify:                             [] Unknown Date discontinued:        / /             MM DD YYYY Why did you stop taking this medication? (check all that apply) [] Allergic reaction [] Medication side effects [] Unable to      adhere/monitor [] Had an    operation/procedure    that required stopping it [] Patient unable to        afford it [] Patient no longer        wants to take this        medication [] Provider decision [] Pregnancy [] Other (specify: ) [] Unknown Reason   If started or changed  What medication are you taking now? [] Albiglutide  (Tanzeum) [] Dulaglutide (Trulicity) [] Exanatide (Byetta, Bydureon) [] Liraglutide (Victoza, Saxenda) [] Lixisenatide (Adlyxin) [x] Semaglutice (Ozempic)     11/10/2018 ([facility]C Release)

## 2021-02-16 ENCOUNTER — Ambulatory Visit: Payer: Medicare HMO | Admitting: Podiatry

## 2021-02-19 ENCOUNTER — Ambulatory Visit: Payer: Medicare HMO | Admitting: Podiatry

## 2021-02-21 ENCOUNTER — Ambulatory Visit: Payer: Medicare HMO | Admitting: Podiatry

## 2021-04-03 ENCOUNTER — Ambulatory Visit (HOSPITAL_BASED_OUTPATIENT_CLINIC_OR_DEPARTMENT_OTHER): Payer: HMO | Admitting: Cardiology

## 2021-04-04 ENCOUNTER — Ambulatory Visit: Payer: HMO | Admitting: Neurology

## 2021-04-11 ENCOUNTER — Telehealth: Payer: Self-pay | Admitting: *Deleted

## 2021-04-11 DIAGNOSIS — Z006 Encounter for examination for normal comparison and control in clinical research program: Secondary | ICD-10-CM

## 2021-04-11 NOTE — Telephone Encounter (Signed)
I called patient for [facility] Diabetes phone call. Patient is doing well overall. Patient sees Dr. WStephanie Acrefor management of diabetes. Her last hemoglobin A1C was 8.1. Patient is taking Ozempic for her diabetes but does not take Jardiance any longer. She stopped Jardiance about 7 months ago. I thanked patient for participating in the study. I reminded her to follow diabetic diet and exercise as she can. I also reminded her to follow up with her primary doctor regularly to keep hemoglobin A1C under good control.

## 2021-04-12 ENCOUNTER — Ambulatory Visit (INDEPENDENT_AMBULATORY_CARE_PROVIDER_SITE_OTHER): Payer: HMO | Admitting: Cardiology

## 2021-04-12 ENCOUNTER — Other Ambulatory Visit: Payer: Self-pay

## 2021-04-12 ENCOUNTER — Encounter (HOSPITAL_BASED_OUTPATIENT_CLINIC_OR_DEPARTMENT_OTHER): Payer: Self-pay | Admitting: Cardiology

## 2021-04-12 VITALS — BP 140/63 | HR 94 | Ht 64.0 in | Wt 263.0 lb

## 2021-04-12 DIAGNOSIS — I2510 Atherosclerotic heart disease of native coronary artery without angina pectoris: Secondary | ICD-10-CM

## 2021-04-12 DIAGNOSIS — E785 Hyperlipidemia, unspecified: Secondary | ICD-10-CM

## 2021-04-12 DIAGNOSIS — I10 Essential (primary) hypertension: Secondary | ICD-10-CM

## 2021-04-12 DIAGNOSIS — E1169 Type 2 diabetes mellitus with other specified complication: Secondary | ICD-10-CM | POA: Diagnosis not present

## 2021-04-12 DIAGNOSIS — E669 Obesity, unspecified: Secondary | ICD-10-CM

## 2021-04-12 NOTE — Progress Notes (Signed)
Cardiology Office Note   Date:  04/12/2021   ID:  Dawn Davidson, DOB May 06, 1951, MRN [id number]  PCP:  Jonathon Jordan, MD  Cardiologist:  Buford Dresser, MD   Chief Complaint:  Follow up  History of Present Illness:    Dawn Davidson is a 70 y.o. female with a hx of hypertension, hyperlipidemia, coronary artery disease with MI in 2010 s/p DES to RCA in 2013, morbid obesity, and hypothyroidism who is seen for follow up today.   Cardiac history: She had a prior admission in 04/2017 for acute diastolic heart failure. She is concerned because her prior heart attack symptoms did not involve chest pain and were atypical (headaches and vague symptoms). Was told she was having a heart attack in 2007 but didn't get a stent. Had a stent in [place] at [facility] in 2013 for a nearly complete narrowing of a coronary vessel (requested records, note below from [facility]). Her symptoms were shortness of breath and fatigue at that time.  Today: Overall, she is stressed and not feeling well. Constantly she feels very fatigued, but is unable to sleep due to insomnia.   Recently she was started on gabapentin because the steroidal injections are not working. After this her fatigue worsened.    She endorses occasional heartburn. After drinking soda, her heartburn usually dissipates with belching.  In the morning she suffers from muscle cramps, and sometimes her feet develop purple skin discoloration.  She forgets to check her blood pressure at home. When she does check it, her blood pressure is normally in the 120s/50s.  She denies any palpitations, chest pain, or shortness of breath. No lightheadedness, headaches, syncope, orthopnea, or PND. Also has no lower extremity edema or exertional symptoms.   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   Chronic  lower back pain    Coronary artery disease    2010 MI   Depression    Family history of adverse reaction to anesthesia    her mother gets sick   GERD (gastroesophageal reflux disease)    Hyperlipidemia    Hypertension    Hypothyroidism since 02/2015   Macular degeneration, bilateral    Migraine    "q couple weeks recently" (08/18/2015)   Morbid obesity ([place]) 05/10/2015   Myocardial infarction ([place]) 2012   Temporary low platelet count ([place]) 2012   checked every 6 mths...kept her in for 1 week with MI, and she's not sure exactly why   Past Surgical History:  Procedure Laterality Date   BACK SURGERY     BREAST BIOPSY Left 08/2015   BREAST EXCISIONAL BIOPSY Left 11/23/2014   benign fibrocystic    BREAST LUMPECTOMY WITH RADIOACTIVE SEED LOCALIZATION Left 11/23/2015   Procedure: LEFT BREAST LUMPECTOMY WITH RADIOACTIVE SEED LOCALIZATION;  Surgeon: Fanny Skates, MD;  Location: [facility];  Service: General;  Laterality: Left;   CARDIAC CATHETERIZATION  2013   CHOLECYSTECTOMY OPEN     CORONARY ANGIOPLASTY WITH STENT PLACEMENT  2013   12/09/11 95% stenosis proximal RCA->DES; otherwise no significant CAD   EYE SURGERY     cataract right eye    GASTRIC RESTRICTION SURGERY  1974   JOINT REPLACEMENT Right    knee   MAXIMUM ACCESS (MAS)POSTERIOR LUMBAR INTERBODY FUSION (PLIF) 1 LEVEL N/A 08/18/2015   Procedure: Lumbar four-five Maximum access posterior lumbar interbody fusion;  Surgeon: Erline Levine, MD;  Location: [facility];  Service: Neurosurgery;  Laterality: N/A;  L4-5 Maximum access posterior lumbar interbody fusion   REPLACEMENT TOTAL KNEE Right 2007   TONSILLECTOMY     TOTAL HIP ARTHROPLASTY Right 11/01/2016   Procedure: RIGHT TOTAL HIP ARTHROPLASTY ANTERIOR APPROACH;  Surgeon: Mcarthur Rossetti, MD;  Location: [facility];  Service: Orthopedics;  Laterality: Right;   TOTAL KNEE ARTHROPLASTY Left 02/28/2017   Procedure: LEFT TOTAL KNEE ARTHROPLASTY;  Surgeon: Mcarthur Rossetti, MD;   Location: [facility];  Service: Orthopedics;  Laterality: Left;     Current Meds  Medication Sig   aspirin EC 81 MG tablet Take by mouth.   atorvastatin (LIPITOR) 40 MG tablet Take 40 mg by mouth daily.    carisoprodol (SOMA) 350 MG tablet Take 350 mg by mouth as needed.   cholecalciferol (VITAMIN D) 1000 UNITS tablet Take 1,000 Units by mouth daily.   DULoxetine (CYMBALTA) 60 MG capsule Take 60 mg by mouth 2 (two) times daily.   furosemide (LASIX) 20 MG tablet Take 1 tablet (20 mg total) by mouth daily.   gabapentin (NEURONTIN) 100 MG capsule 300 mg 3 (three) times daily. 300 MG THREE TIMES DAILY   hydrOXYzine (ATARAX/VISTARIL) 25 MG tablet Take 25 mg by mouth 2 (two) times a day.    levothyroxine (SYNTHROID, LEVOTHROID) 50 MCG tablet Take 50 mcg by mouth daily before breakfast.    losartan (COZAAR) 50 MG tablet TAKE ONE TABLET BY MOUTH DAILY   metoprolol tartrate (LOPRESSOR) 25 MG tablet Take 25 mg by mouth 2 (two) times daily.    nitroGLYCERIN (NITROSTAT) 0.4 MG SL tablet Place 0.4 mg under the tongue every 5 (five) minutes as needed for chest pain. Reported on 10/09/2015   omeprazole (PRILOSEC) 20 MG capsule Take 20 mg by mouth daily before breakfast.    potassium chloride (K-DUR) 10 MEQ tablet Take 10 mEq by mouth daily.    Semaglutide (OZEMPIC, 0.25 OR 0.5 MG/DOSE, [place]) Inject 0.5 mg into the skin once a week.   topiramate (TOPAMAX) 50 MG tablet Take 50 mg by mouth at bedtime.   traZODone (DESYREL) 100 MG tablet Take 100 mg by mouth at bedtime.     Allergies:   Oxycodone, Sulfa antibiotics, and Venlafaxine   Social History   Tobacco Use   Smoking status: Never   Smokeless tobacco: Never  Substance Use Topics   Alcohol use: No    Alcohol/week: 0.0 standard drinks   Drug use: No     Family Hx: The patient's family history includes Bipolar disorder in her daughter and daughter; Cancer in her brother; Heart disease in her sister; Stomach cancer in her mother; Thyroid disease in her  daughter and grandchild.  ROS:   Please see the history of present illness.    (+) Fatigue (+) Insomnia (+) Stress (+) Acid reflux (+) Muscle cramps All other systems reviewed and are negative.  Prior CV studies:   The following studies were reviewed today:  Echo 01/14/19  1. The left ventricle has normal systolic function with an ejection  fraction of 60-65%. The cavity size was normal. Left ventricular diastolic  Doppler parameters are consistent with impaired relaxation.   2. The right ventricle has normal systolic function. The cavity was  normal.   3. The mitral valve is grossly normal. There is mild mitral annular  calcification present.   4. The tricuspid valve is grossly normal.   5. The aortic valve is tricuspid. Mild thickening  of the aortic valve.  Aortic valve regurgitation is moderate by color flow Doppler. No stenosis  of the aortic valve.   6. Normal LV function; mild diastolic dysfunction; sclerotic aortic valve  with moderate AI.   Cath report from [facility] (12/09/2011) Coronary Findings: Dominance and General Appearance Right dominant with Single Vessel CAD involving the RCA Left Main Coronary Artery No significant Left Main lesions were identified Left Anterior Descending Artery No significant Left Anterior Descending Artery lesions were identified. Left Circumflex Artery No significant lesions in Circumflex Artery were identified. Right Coronary Artery Complex 95% proximal lesion in RCA Left to right collaterals were noted   Comments: Using above hardware and Angiomax for anticoagulation, lesion was crossed with fielder wire (Pro-Water and PT graphix were unable to cross). using over the wire balloon (1.25 mm) lesion in mid RCA was pre dilated with that as well as a 2 mm balloon. Then a 3 x 28 mm DES per study protocol (EVOLVE-2) was deployed by Dr. Ennis Forts. Lesion was post dilated with 3 and 3.25 mm Lillie balloon resulting in 0% residual and TIMI  3 distal flow. All distal branches were accounted for. Conclusion: Diagnostic Results: Single Vessel CAD involving the RCA  Coronary Intervention Results: PCI Indication: Standard PCI Indications Procedural Success: Complete Successful PTCA/ Stenting- RCA to 0 % w/ DES  Vascular Access Management: Pulled, TR band applied to the right wrist   Had stress test here in 05/2015 but was feeling generally well at the time, but was preop for back surgery and couldn't walk. Study Highlights    Nuclear stress EF: 65%. The left ventricular ejection fraction is normal (55-65%). No T wave inversion was noted during stress. There was no ST segment deviation noted during stress. This is a low risk study.   No reversible perfusion defects. LVEF 65%. Normal wall motion. This is a low risk study.     Labs/Other Tests and Data Reviewed:    EKG:  04/12/2021: NSR at 94 bpm, PRWP 04/20/2020: EKG was not ordered. 03/12/2018: NSR, PRWP  Recent Labs: No results found for requested labs within last 8760 hours.   Recent Lipid Panel Lab Results  Component Value Date/Time   CHOL 137 01/13/2019 02:40 PM   TRIG 166 (H) 01/13/2019 02:40 PM   HDL 43 01/13/2019 02:40 PM   CHOLHDL 3.2 01/13/2019 02:40 PM   [place] 61 01/13/2019 02:40 PM    Objective:    Vital Signs:  BP 140/63   Pulse 94   Ht '5\' 4"'$  (1.626 m)   Wt 263 lb (119.3 kg)   SpO2 94%   BMI 45.14 kg/m    Wt Readings from Last 3 Encounters:  04/12/21 263 lb (119.3 kg)  04/20/20 267 lb (121.1 kg)  12/15/19 260 lb (117.9 kg)    GEN: Well nourished, well developed in no acute distress HEENT: Normal, moist mucous membranes NECK: No JVD CARDIAC: regular rhythm, normal S1 and S2, no rubs or gallops. No murmur. VASCULAR: Radial and DP pulses 2+ bilaterally. No carotid bruits RESPIRATORY:  Clear to auscultation without rales, wheezing or rhonchi  ABDOMEN: Soft, non-tender, non-distended MUSCULOSKELETAL:  Ambulates independently SKIN:  Warm and dry, no edema NEUROLOGIC:  Alert and oriented x 3. No focal neuro deficits noted. PSYCHIATRIC:  Normal affect    ASSESSMENT & PLAN:    CAD with RCA stent in 2013 Hyperlipidemia: -continue atorvastatin 40 mg daily -LDL is 78, goal <70. Has been at goal previously, working on improving diabetes control. Recheck  once diabetes control is improved/stable -on aspirin 81 mg -on semaglutide, tolerating   Hypertension -at goal based on home numbers, above goal in office but reports that she is in pain -continue losartan, metoprolol, furosemide   Type II diabetes -A1c spiked recently, now downtrending, last A1c 8.1 -on semaglutide -continue aspirin, statin as above   Morbid obesity -working on weight loss, on semaglutide  Follow up in 1 year or sooner as needed.  Medication Adjustments/Labs and Tests Ordered: Current medicines are reviewed at length with the patient today.  Concerns regarding medicines are outlined above.   Tests Ordered: Orders Placed This Encounter  Procedures   EKG 12-Lead    Medication Changes: No orders of the defined types were placed in this encounter.  Patient Instructions  Medication Instructions:  Your Physician recommend you continue on your current medication as directed.    *If you need a refill on your cardiac medications before your next appointment, please call your pharmacy*   Lab Work: None ordered today   Testing/Procedures: None ordered today   Follow-Up: At [facility], you and your health needs are our priority.  As part of our continuing mission to provide you with exceptional heart care, we have created designated Provider Care Teams.  These Care Teams include your primary Cardiologist (physician) and Advanced Practice Providers (APPs -  Physician Assistants and Nurse Practitioners) who all work together to provide you with the care you need, when you need it.  We recommend signing up for the patient portal called  "MyChart".  Sign up information is provided on this After Visit Summary.  MyChart is used to connect with patients for Virtual Visits (Telemedicine).  Patients are able to view lab/test results, encounter notes, upcoming appointments, etc.  Non-urgent messages can be sent to your provider as well.   To learn more about what you can do with MyChart, go to [email or web address].    Your next appointment:   1 year(s)  The format for your next appointment:   In Person  Provider:   Buford Dresser, MD   [facility],acting as a scribe for Buford Dresser, MD.,have documented all relevant documentation on the behalf of Buford Dresser, MD,as directed by  Buford Dresser, MD while in the presence of Buford Dresser, MD.  I, Buford Dresser, MD, have reviewed all documentation for this visit. The documentation on 04/12/21 for the exam, diagnosis, procedures, and orders are all accurate and complete.   Signed, Buford Dresser, MD  04/12/2021 5:56 PM    [place]

## 2021-04-12 NOTE — Patient Instructions (Signed)

## 2021-04-23 ENCOUNTER — Ambulatory Visit: Payer: HMO | Admitting: Neurology

## 2021-05-03 NOTE — Progress Notes (Signed)
Assessment/Plan:   1.  Head tremor  -No evidence of cervical dystonia  -Likely essential tremor.  Not bothersome to patient.  Would not recommend any medications, as risks outweigh benefits.  She agrees.  2.  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  Gabapentin alone can do this, and is often made worse with renal insufficiency.  Her last labs were not sent with the referral, and last labs that I have in the system are well over a year ago.  -Patient is going to talk to her prescribing provider (the PA at Dr. Melven Sartorius office) about potentially decreasing the gabapentin.  4.  Memory loss  -Doubt neurodegenerative  -Topiramate could be contributing.  She has been on this for many years for migraine and has not had a migraine in many years.  I am going to wean her off of that.  She is only on 50 mg.  She will drop to 25 mg x 2 weeks and then stop the medication.  -Told her if she still had memory change in the next few months after off of the topiramate and potentially decreased on the gabapentin, she can call us and we will schedule neurocognitive testing.  Discussed nature of that.  5.  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  She has evidence of this on examination today.     Subjective:   Dawn Davidson was seen in consultation in the movement disorder clinic at the request of Jonathon Jordan, MD.  The evaluation is for tremor and head and hands.  I saw the patient over 5 years ago for head tremor as well as dropping objects.  At that point in time, I felt that the patient had benign head tremor, and likely a manifestation of essential tremor.  There was no evidence of cervical dystonia at that time.  In regards to dropping objects, we offered EMG, but it was declined at that point in time.    Since that time, patient reports that tremor got worse, mostly over the last 7 months.  States that if she is typing, she will have a jerking motion of  the hand and she will have to re-correct what she is typing.  She can have jerking in the arms without typing as well but not in the legs.  Family tells her that head is "bobbing" more but she doesn't notice that.    Also notes some short term memory issues.  Long term memory is good  on topamax for "years."  She is on it for HA prevention.  States that she has not had migraine for "a long time" and patient questions whether she needs the medication.       She is on gabapentin since April for pinched nerve in the back.  She states that it may or may not help but also getting shots in the back.    She also complains of balance change.  No falls.  Outside reports reviewed: historical medical records and referral letter/letters.  Allergies  Allergen Reactions   Oxycodone Shortness Of Breath   Sulfa Antibiotics Shortness Of Breath   Venlafaxine Other (See Comments)    agitated     Current Outpatient Medications  Medication Instructions   aspirin EC 81 MG tablet Oral   atorvastatin (LIPITOR) 40 mg, Oral, Daily   carisoprodol (SOMA) 350 mg, Oral, As needed   cholecalciferol (VITAMIN D) 1,000 Units, Oral, Daily   DULoxetine (CYMBALTA) 60 mg, Oral, 2 times  daily   furosemide (LASIX) 20 mg, Oral, Daily   gabapentin (NEURONTIN) 300 mg, 3 times daily, 300 MG THREE TIMES DAILY   hydrOXYzine (ATARAX/VISTARIL) 25 mg, Oral, 2 times daily   levothyroxine (SYNTHROID) 50 mcg, Oral, Daily before breakfast   losartan (COZAAR) 50 MG tablet TAKE ONE TABLET BY MOUTH DAILY   metoprolol tartrate (LOPRESSOR) 25 mg, Oral, 2 times daily   nitroGLYCERIN (NITROSTAT) 0.4 mg, Sublingual, Every 5 min PRN, Reported on 10/09/2015   omeprazole (PRILOSEC) 20 mg, Oral, Daily before breakfast   potassium chloride (K-DUR) 10 MEQ tablet 10 mEq, Oral, Daily   Semaglutide (OZEMPIC, 0.25 OR 0.5 MG/DOSE, [place]) 0.5 mg, Subcutaneous, Weekly   topiramate (TOPAMAX) 50 mg, Oral, Daily at bedtime   traZODone (DESYREL) 100 mg, Oral,  Daily at bedtime     Objective:   VITALS:   Vitals:   05/08/21 0848  BP: (!) 145/42  Pulse: 75  SpO2: 93%  Weight: 244 lb 9.6 oz (110.9 kg)  Height: '5\' 6"'$  (1.676 m)   Gen:  Appears stated age and in NAD. HEENT:  Normocephalic, atraumatic. The mucous membranes are moist.   NEUROLOGICAL:  Orientation:  The patient is alert and oriented x 3.   Cranial nerves: There is good facial symmetry. Extraocular muscles are intact and visual fields are full to confrontational testing. Speech is fluent and clear. Soft palate rises symmetrically and there is no tongue deviation. Hearing is intact to conversational tone. Tone: Tone is good throughout. Sensation: Sensation is intact to light touch touch throughout (facial, trunk, extremities). Vibration is intact at the bilateral big toe. There is no extinction with double simultaneous stimulation. There is no sensory dermatomal level identified. Coordination:  The patient has no dysdiadichokinesia or dysmetria. Motor: Strength is 5/5 in the bilateral upper and lower extremities.  Shoulder shrug is equal bilaterally.  There is no pronator drift.  There are no fasciculations noted.  Gait and Station: The patient has an antalgic gait. She is just slightly unsteady and short stepped but no shuffling  MOVEMENT EXAM: Tremor: Patient had intermittent head tremor, mostly in the yes direction, but occasionally more complex.  There was no null point.  No trouble with Archimedes spirals.   I have reviewed and interpreted the following labs independently   Chemistry      Component Value Date/Time   NA 140 10/29/2019 1230   K 4.5 10/29/2019 1230   CL 99 10/29/2019 1230   CO2 25 10/29/2019 1230   BUN 16 10/29/2019 1230   CREATININE 0.95 10/29/2019 1230      Component Value Date/Time   CALCIUM 9.2 10/29/2019 1230   ALKPHOS 110 01/13/2019 1440   AST 10 01/13/2019 1440   ALT 8 01/13/2019 1440   BILITOT 0.3 01/13/2019 1440      Lab Results   Component Value Date   WBC 7.9 01/13/2019   HGB 13.2 01/13/2019   HCT 40.7 01/13/2019   MCV 76 (L) 01/13/2019   PLT 223 01/13/2019   Lab Results  Component Value Date   TSH 3.000 01/13/2019      Total time spent on today's visit was 33 minutes, including both face-to-face time and nonface-to-face time.  Time included that spent on review of records (prior notes available to me/labs/imaging if pertinent), discussing treatment and goals, answering patient's questions and coordinating care.  CC:  Jonathon Jordan, MD

## 2021-05-08 ENCOUNTER — Ambulatory Visit: Payer: HMO | Admitting: Neurology

## 2021-05-08 ENCOUNTER — Encounter: Payer: Self-pay | Admitting: Neurology

## 2021-05-08 ENCOUNTER — Other Ambulatory Visit: Payer: Self-pay

## 2021-05-08 VITALS — BP 145/42 | HR 75 | Ht 66.0 in | Wt 244.6 lb

## 2021-05-08 DIAGNOSIS — E1142 Type 2 diabetes mellitus with diabetic polyneuropathy: Secondary | ICD-10-CM | POA: Diagnosis not present

## 2021-05-08 DIAGNOSIS — G253 Myoclonus: Secondary | ICD-10-CM

## 2021-05-08 DIAGNOSIS — G250 Essential tremor: Secondary | ICD-10-CM

## 2021-05-08 DIAGNOSIS — R413 Other amnesia: Secondary | ICD-10-CM

## 2021-05-08 NOTE — Patient Instructions (Addendum)
We discussed that you likely have myoclonus from gabapentin.  Talk to your prescribing provider about this.  May be able to just decrease the dosage and it may help Decrease topamax to 25 mg x 2 weeks and then STOP topamax If you are off of topamax and potentially gabapentin (or decreased the dose) for a few months and feel like you still have memory change, call us and we can schedule the neurocognitive testing that we discussed The physicians and staff at [facility] are committed to providing excellent care. You may receive a survey requesting feedback about your experience at our office. We strive to receive "very good" responses to the survey questions. If you feel that your experience would prevent you from giving the office a "very good " response, please contact our office to try to remedy the situation. We may be reached at [phone]. Thank you for taking the time out of your busy day to complete the survey.

## 2021-08-09 ENCOUNTER — Other Ambulatory Visit: Payer: Self-pay

## 2021-08-09 ENCOUNTER — Telehealth: Payer: Self-pay

## 2021-08-09 ENCOUNTER — Encounter: Payer: Self-pay | Admitting: Neurology

## 2021-08-09 DIAGNOSIS — G253 Myoclonus: Secondary | ICD-10-CM

## 2021-08-09 DIAGNOSIS — G35 Multiple sclerosis: Secondary | ICD-10-CM

## 2021-08-09 NOTE — Telephone Encounter (Signed)
Called patient she has already called PCP who asked her to call our office. I instructed patient if this happens again to go to ER. Let front desk know of CX list and VV if needed for patient. EEG has been put in and the front desk is calling to schedule

## 2021-08-09 NOTE — Telephone Encounter (Signed)
ERROR

## 2021-08-15 ENCOUNTER — Ambulatory Visit: Payer: HMO | Admitting: Neurology

## 2021-08-15 ENCOUNTER — Other Ambulatory Visit: Payer: Self-pay

## 2021-08-15 DIAGNOSIS — R4182 Altered mental status, unspecified: Secondary | ICD-10-CM

## 2021-08-15 DIAGNOSIS — G253 Myoclonus: Secondary | ICD-10-CM

## 2021-08-16 ENCOUNTER — Ambulatory Visit
Admission: RE | Admit: 2021-08-16 | Discharge: 2021-08-16 | Disposition: A | Discharge status: Home / Self Care | Payer: HMO | Source: Ambulatory Visit | Attending: Family Medicine | Admitting: Family Medicine

## 2021-08-16 ENCOUNTER — Other Ambulatory Visit: Payer: Self-pay | Admitting: Family Medicine

## 2021-08-16 DIAGNOSIS — R921 Mammographic calcification found on diagnostic imaging of breast: Secondary | ICD-10-CM

## 2021-08-16 NOTE — Procedures (Signed)
TECHNICAL SUMMARY:  A multichannel referential and bipolar montage EEG using the standard international 10-20 system was performed on the patient described as awake, drowsy and asleep.  The dominant background activity consists of 9 hertz activity seen most prominantly over the posterior head region.  Low voltage fast (beta) activity is distributed symmetrically and maximally over the anterior head regions.  ACTIVATION:  Stepwise photic stimulation at 4-20 flashes per second was performed and did not elicit any abnormal waveforms.  Hyperventilation was not performed.  EPILEPTIFORM ACTIVITY:  There were no spikes, sharp waves or paroxysmal activity.  Patient did report feeling like her "head was going to explode."  This was unaccompanied by any changes in the background activity.  SLEEP: Stage I and stage II sleep architecture are identified.    IMPRESSION:  This is a normal EEG for the patients stated age.  There were no focal, hemispheric or lateralizing features.  No epileptiform activity was recorded.  A normal EEG does not exclude the diagnosis of a seizure disorder and if seizure remains high on the list of differential diagnosis, an ambulatory EEG may be of value.  Clinical correlation is required.

## 2021-09-10 ENCOUNTER — Telehealth: Payer: Self-pay | Admitting: Neurology

## 2021-09-10 NOTE — Telephone Encounter (Signed)
Pt was on the wait list for Dr Tat and we called to offer her appt with Clarise Cruz sooner. She states that she was doing better and did not need appt at this time and would call back if she needed to make appt with Dr Tat

## 2021-09-12 ENCOUNTER — Ambulatory Visit
Admission: RE | Admit: 2021-09-12 | Discharge: 2021-09-12 | Disposition: A | Discharge status: Home / Self Care | Payer: Medicare HMO | Source: Ambulatory Visit | Attending: Family Medicine | Admitting: Family Medicine

## 2021-09-12 ENCOUNTER — Other Ambulatory Visit: Payer: Self-pay

## 2021-09-12 DIAGNOSIS — N6032 Fibrosclerosis of left breast: Secondary | ICD-10-CM | POA: Diagnosis not present

## 2021-09-12 DIAGNOSIS — R921 Mammographic calcification found on diagnostic imaging of breast: Secondary | ICD-10-CM | POA: Diagnosis not present

## 2021-09-17 DIAGNOSIS — M4120 Other idiopathic scoliosis, site unspecified: Secondary | ICD-10-CM | POA: Diagnosis not present

## 2021-09-17 DIAGNOSIS — N1831 Chronic kidney disease, stage 3a: Secondary | ICD-10-CM | POA: Diagnosis not present

## 2021-09-17 DIAGNOSIS — I5031 Acute diastolic (congestive) heart failure: Secondary | ICD-10-CM | POA: Diagnosis not present

## 2021-09-17 DIAGNOSIS — I25110 Atherosclerotic heart disease of native coronary artery with unstable angina pectoris: Secondary | ICD-10-CM | POA: Diagnosis not present

## 2021-09-17 DIAGNOSIS — E039 Hypothyroidism, unspecified: Secondary | ICD-10-CM | POA: Diagnosis not present

## 2021-09-17 DIAGNOSIS — R69 Illness, unspecified: Secondary | ICD-10-CM | POA: Diagnosis not present

## 2021-09-17 DIAGNOSIS — E1122 Type 2 diabetes mellitus with diabetic chronic kidney disease: Secondary | ICD-10-CM | POA: Diagnosis not present

## 2021-09-17 DIAGNOSIS — I10 Essential (primary) hypertension: Secondary | ICD-10-CM | POA: Diagnosis not present

## 2021-09-17 DIAGNOSIS — M47816 Spondylosis without myelopathy or radiculopathy, lumbar region: Secondary | ICD-10-CM | POA: Diagnosis not present

## 2021-09-17 DIAGNOSIS — M542 Cervicalgia: Secondary | ICD-10-CM | POA: Diagnosis not present

## 2021-10-11 DIAGNOSIS — M47816 Spondylosis without myelopathy or radiculopathy, lumbar region: Secondary | ICD-10-CM | POA: Diagnosis not present

## 2021-11-19 ENCOUNTER — Encounter (HOSPITAL_BASED_OUTPATIENT_CLINIC_OR_DEPARTMENT_OTHER): Payer: Self-pay

## 2021-11-29 DIAGNOSIS — M47816 Spondylosis without myelopathy or radiculopathy, lumbar region: Secondary | ICD-10-CM | POA: Diagnosis not present

## 2021-12-04 DIAGNOSIS — D692 Other nonthrombocytopenic purpura: Secondary | ICD-10-CM | POA: Diagnosis not present

## 2021-12-04 DIAGNOSIS — I10 Essential (primary) hypertension: Secondary | ICD-10-CM | POA: Diagnosis not present

## 2021-12-04 DIAGNOSIS — E039 Hypothyroidism, unspecified: Secondary | ICD-10-CM | POA: Diagnosis not present

## 2021-12-04 DIAGNOSIS — E1122 Type 2 diabetes mellitus with diabetic chronic kidney disease: Secondary | ICD-10-CM | POA: Diagnosis not present

## 2021-12-04 DIAGNOSIS — R69 Illness, unspecified: Secondary | ICD-10-CM | POA: Diagnosis not present

## 2021-12-04 DIAGNOSIS — M549 Dorsalgia, unspecified: Secondary | ICD-10-CM | POA: Diagnosis not present

## 2022-01-21 ENCOUNTER — Ambulatory Visit: Payer: HMO | Admitting: Podiatry

## 2022-01-31 ENCOUNTER — Ambulatory Visit: Payer: HMO | Admitting: Podiatry

## 2022-02-15 DIAGNOSIS — H52223 Regular astigmatism, bilateral: Secondary | ICD-10-CM | POA: Diagnosis not present

## 2022-02-15 DIAGNOSIS — Z9849 Cataract extraction status, unspecified eye: Secondary | ICD-10-CM | POA: Diagnosis not present

## 2022-02-15 DIAGNOSIS — H524 Presbyopia: Secondary | ICD-10-CM | POA: Diagnosis not present

## 2022-02-15 DIAGNOSIS — H3530 Unspecified macular degeneration: Secondary | ICD-10-CM | POA: Diagnosis not present

## 2022-02-15 DIAGNOSIS — Z961 Presence of intraocular lens: Secondary | ICD-10-CM | POA: Diagnosis not present

## 2022-02-15 DIAGNOSIS — H5203 Hypermetropia, bilateral: Secondary | ICD-10-CM | POA: Diagnosis not present

## 2022-02-15 DIAGNOSIS — H353131 Nonexudative age-related macular degeneration, bilateral, early dry stage: Secondary | ICD-10-CM | POA: Diagnosis not present

## 2022-02-25 DIAGNOSIS — Z0100 Encounter for examination of eyes and vision without abnormal findings: Secondary | ICD-10-CM | POA: Diagnosis not present

## 2022-03-01 ENCOUNTER — Ambulatory Visit: Payer: HMO | Admitting: Podiatry

## 2022-04-15 ENCOUNTER — Encounter (HOSPITAL_BASED_OUTPATIENT_CLINIC_OR_DEPARTMENT_OTHER): Payer: Self-pay | Admitting: *Deleted

## 2022-04-15 DIAGNOSIS — R413 Other amnesia: Secondary | ICD-10-CM | POA: Insufficient documentation

## 2022-04-15 DIAGNOSIS — I252 Old myocardial infarction: Secondary | ICD-10-CM

## 2022-04-15 DIAGNOSIS — D692 Other nonthrombocytopenic purpura: Secondary | ICD-10-CM | POA: Insufficient documentation

## 2022-04-15 DIAGNOSIS — E559 Vitamin D deficiency, unspecified: Secondary | ICD-10-CM | POA: Insufficient documentation

## 2022-04-15 DIAGNOSIS — N1831 Chronic kidney disease, stage 3a: Secondary | ICD-10-CM

## 2022-04-15 DIAGNOSIS — E1121 Type 2 diabetes mellitus with diabetic nephropathy: Secondary | ICD-10-CM

## 2022-04-15 DIAGNOSIS — K529 Noninfective gastroenteritis and colitis, unspecified: Secondary | ICD-10-CM | POA: Insufficient documentation

## 2022-04-15 DIAGNOSIS — E1165 Type 2 diabetes mellitus with hyperglycemia: Secondary | ICD-10-CM | POA: Insufficient documentation

## 2022-04-15 DIAGNOSIS — F419 Anxiety disorder, unspecified: Secondary | ICD-10-CM | POA: Insufficient documentation

## 2022-04-15 DIAGNOSIS — Z800 Family history of malignant neoplasm of digestive organs: Secondary | ICD-10-CM | POA: Insufficient documentation

## 2022-04-15 DIAGNOSIS — R251 Tremor, unspecified: Secondary | ICD-10-CM | POA: Insufficient documentation

## 2022-04-15 DIAGNOSIS — R739 Hyperglycemia, unspecified: Secondary | ICD-10-CM | POA: Insufficient documentation

## 2022-04-15 DIAGNOSIS — E2839 Other primary ovarian failure: Secondary | ICD-10-CM | POA: Insufficient documentation

## 2022-04-15 DIAGNOSIS — R7982 Elevated C-reactive protein (CRP): Secondary | ICD-10-CM

## 2022-04-15 DIAGNOSIS — K589 Irritable bowel syndrome without diarrhea: Secondary | ICD-10-CM

## 2022-04-15 DIAGNOSIS — B070 Plantar wart: Secondary | ICD-10-CM | POA: Insufficient documentation

## 2022-04-15 DIAGNOSIS — T148XXA Other injury of unspecified body region, initial encounter: Secondary | ICD-10-CM | POA: Insufficient documentation

## 2022-04-15 DIAGNOSIS — G4733 Obstructive sleep apnea (adult) (pediatric): Secondary | ICD-10-CM | POA: Insufficient documentation

## 2022-04-15 DIAGNOSIS — I5031 Acute diastolic (congestive) heart failure: Secondary | ICD-10-CM

## 2022-04-15 DIAGNOSIS — Z9884 Bariatric surgery status: Secondary | ICD-10-CM

## 2022-04-15 HISTORY — DX: Other amnesia: R41.3

## 2022-04-15 HISTORY — DX: Irritable bowel syndrome, unspecified: K58.9

## 2022-04-15 HISTORY — DX: Chronic kidney disease, stage 3a: N18.31

## 2022-04-15 HISTORY — DX: Acute diastolic (congestive) heart failure: I50.31

## 2022-04-15 HISTORY — DX: Family history of malignant neoplasm of digestive organs: Z80.0

## 2022-04-15 HISTORY — DX: Other nonthrombocytopenic purpura: D69.2

## 2022-04-15 HISTORY — DX: Old myocardial infarction: I25.2

## 2022-04-15 HISTORY — DX: Vitamin D deficiency, unspecified: E55.9

## 2022-04-15 HISTORY — DX: Bariatric surgery status: Z98.84

## 2022-04-15 HISTORY — DX: Obstructive sleep apnea (adult) (pediatric): G47.33

## 2022-04-15 HISTORY — DX: Elevated C-reactive protein (CRP): R79.82

## 2022-04-15 HISTORY — DX: Type 2 diabetes mellitus with diabetic nephropathy: E11.21

## 2022-04-22 ENCOUNTER — Ambulatory Visit (HOSPITAL_BASED_OUTPATIENT_CLINIC_OR_DEPARTMENT_OTHER): Payer: Medicare HMO | Admitting: Cardiology

## 2022-04-22 ENCOUNTER — Encounter (HOSPITAL_BASED_OUTPATIENT_CLINIC_OR_DEPARTMENT_OTHER): Payer: Self-pay | Admitting: Cardiology

## 2022-04-22 VITALS — BP 134/60 | HR 66 | Ht 66.0 in | Wt 245.0 lb

## 2022-04-22 DIAGNOSIS — I2510 Atherosclerotic heart disease of native coronary artery without angina pectoris: Secondary | ICD-10-CM | POA: Diagnosis not present

## 2022-04-22 DIAGNOSIS — I10 Essential (primary) hypertension: Secondary | ICD-10-CM

## 2022-04-22 DIAGNOSIS — E785 Hyperlipidemia, unspecified: Secondary | ICD-10-CM

## 2022-04-22 DIAGNOSIS — G4733 Obstructive sleep apnea (adult) (pediatric): Secondary | ICD-10-CM

## 2022-04-22 DIAGNOSIS — E669 Obesity, unspecified: Secondary | ICD-10-CM | POA: Diagnosis not present

## 2022-04-22 DIAGNOSIS — E1169 Type 2 diabetes mellitus with other specified complication: Secondary | ICD-10-CM

## 2022-04-22 NOTE — Patient Instructions (Signed)

## 2022-04-22 NOTE — Progress Notes (Signed)
Cardiology Office Note   Date:  04/22/2022   ID:  Dawn Davidson, DOB 1951-07-25, MRN [id number]  PCP:  Jonathon Jordan, MD  Cardiologist:  Buford Dresser, MD   Chief Complaint:  Follow up  History of Present Illness:    Dawn Davidson is a 71 y.o. female with a hx of hypertension, hyperlipidemia, coronary artery disease with MI in 2010 s/p DES to RCA in 2013, morbid obesity, and hypothyroidism who is seen for follow up today.   Cardiac history: She had a prior admission in 04/2017 for acute diastolic heart failure. She is concerned because her prior heart attack symptoms did not involve chest pain and were atypical (headaches and vague symptoms). Was told she was having a heart attack in 2007 but didn't get a stent. Had a stent in [place] at [facility] in 2013 for a nearly complete narrowing of a coronary vessel (requested records, note below from [facility]). Her symptoms were shortness of breath and fatigue at that time.  At her last appointment she was stressed and not feeling well. She struggled with constant fatigue and insomnia. She had been started on gabapentin because the steroidal injections were ineffective. After this her fatigue worsened. When she does check her blood pressure it is normally in the 120s/50s.  Today: She reports struggling with diarrhea and insomnia, ongoing for years. She states that she "can't eat anything without diarrhea." She denies hematochezia. About 4-5 years ago she was able to know what kind of foods she could tolerate and which to avoid. Lately this has been difficult.  She takes 1.0 mg Ozempic, increased about 2 months ago. The day after her injection, she usually doesn't eat. She notes being able to go several days without feeling hungry. Of note, she has had bariatric surgery in the past.  She endorses intermittent chest pains, relieved by drinking a soda and belching.  At home her blood pressures have been well  controlled. She has not needed to take nitroglycerin.  Her diet often includes chicken, and beef every once in a while. She does not use salt, but typically seasons with pepper. Enjoys carrots, corn. Avoids potatoes, rare sweets.  She does not exercise much due to her chronic back issues.  She has a DreamStation CPAP. After a recall, hers was returned with a missing part. Since then she has not been able to use it.  She denies any palpitations, shortness of breath, or peripheral edema. No lightheadedness, headaches, syncope, orthopnea, or PND.   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   Elevated C-reactive protein (CRP) 04/15/2022   Essential hypertension 04/27/2017   Family history of adverse reaction to anesthesia    her mother gets sick   Family history of malignant neoplasm of digestive organs 04/15/2022   GERD (gastroesophageal reflux disease)    History of myocardial infarction 04/15/2022   Hyperlipidemia    Hypertension    Hypothyroidism since 02/2015   Irritable bowel syndrome 04/15/2022   Macular degeneration, bilateral  Memory loss 04/15/2022   Migraine    "q couple weeks recently" (08/18/2015)   Morbid obesity ([place]) 05/10/2015   Myocardial infarction ([place]) 2012   Obstructive sleep apnea syndrome 04/15/2022   Osteoarthritis 07/19/2004   Osteoarthritis   Senile purpura ([place]) 04/15/2022   Temporary low platelet count ([place]) 2012   checked every 6 mths...kept her in for 1 week with MI, and she's not sure exactly why   Thrombocytopenic disorder ([place]) 11/29/2009    Thrombocytopenic disorder   Type 2 diabetes mellitus with obesity ([place]) 04/28/2017   HbA1c 6.5% Aug 2018. No therapy begun while in hospital.   Vitamin D deficiency 04/15/2022   Past Surgical History:  Procedure Laterality Date   BACK SURGERY     BREAST BIOPSY Left 08/2015   BREAST EXCISIONAL BIOPSY Left 11/23/2014   benign fibrocystic    BREAST LUMPECTOMY WITH RADIOACTIVE SEED LOCALIZATION Left 11/23/2015   Procedure: LEFT BREAST LUMPECTOMY WITH RADIOACTIVE SEED LOCALIZATION;  Surgeon: Fanny Skates, MD;  Location: [facility];  Service: General;  Laterality: Left;   CARDIAC CATHETERIZATION  2013   CHOLECYSTECTOMY OPEN     CORONARY ANGIOPLASTY WITH STENT PLACEMENT  2013   12/09/11 95% stenosis proximal RCA->DES; otherwise no significant CAD   EYE SURGERY     cataract right eye    GASTRIC RESTRICTION SURGERY  1974   JOINT REPLACEMENT Right    knee   MAXIMUM ACCESS (MAS)POSTERIOR LUMBAR INTERBODY FUSION (PLIF) 1 LEVEL N/A 08/18/2015   Procedure: Lumbar four-five Maximum access posterior lumbar interbody fusion;  Surgeon: Erline Levine, MD;  Location: [facility];  Service: Neurosurgery;  Laterality: N/A;  L4-5 Maximum access posterior lumbar interbody fusion   REPLACEMENT TOTAL KNEE Right 2007   TONSILLECTOMY     TOTAL HIP ARTHROPLASTY Right 11/01/2016   Procedure: RIGHT TOTAL HIP ARTHROPLASTY ANTERIOR APPROACH;  Surgeon: Mcarthur Rossetti, MD;  Location: [facility];  Service: Orthopedics;  Laterality: Right;   TOTAL KNEE ARTHROPLASTY Left 02/28/2017   Procedure: LEFT TOTAL KNEE ARTHROPLASTY;  Surgeon: Mcarthur Rossetti, MD;  Location: [facility];  Service: Orthopedics;  Laterality: Left;     Current Meds  Medication Sig   aspirin EC 81 MG tablet Take by mouth.   atorvastatin (LIPITOR) 40 MG tablet Take 40 mg by mouth daily.    carisoprodol (SOMA) 350 MG tablet Take 350 mg by mouth as needed.   cholecalciferol (VITAMIN D) 1000 UNITS tablet Take 1,000 Units by mouth daily.   DULoxetine  (CYMBALTA) 60 MG capsule Take 60 mg by mouth 2 (two) times daily.   furosemide (LASIX) 20 MG tablet Take 1 tablet (20 mg total) by mouth daily.   gabapentin (NEURONTIN) 100 MG capsule 300 mg 3 (three) times daily. 300 MG THREE TIMES DAILY   hydrOXYzine (ATARAX/VISTARIL) 25 MG tablet Take 25 mg by mouth 2 (two) times a day.    levothyroxine (SYNTHROID, LEVOTHROID) 50 MCG tablet Take 50 mcg by mouth daily before breakfast.    losartan (COZAAR) 50 MG tablet TAKE ONE TABLET BY MOUTH DAILY   nitroGLYCERIN (NITROSTAT) 0.4 MG SL tablet Place 0.4 mg under the tongue every 5 (five) minutes as needed for chest pain. Reported on 10/09/2015   omeprazole (PRILOSEC) 20 MG capsule Take 20 mg by mouth daily before breakfast.    potassium chloride (K-DUR) 10 MEQ tablet Take 10 mEq by mouth daily.    Semaglutide, 1 MG/DOSE, (OZEMPIC, 1 MG/DOSE,) 2 MG/1.5ML SOPN Inject 1 mg into the skin once a week.  traZODone (DESYREL) 100 MG tablet Take 100 mg by mouth at bedtime.   [DISCONTINUED] Semaglutide (OZEMPIC, 0.25 OR 0.5 MG/DOSE, ) Inject 0.5 mg into the skin once a week.     Allergies:   Oxycodone, Sulfa antibiotics, and Venlafaxine   Social History   Tobacco Use   Smoking status: Never   Smokeless tobacco: Never  Substance Use Topics   Alcohol use: No    Alcohol/week: 0.0 standard drinks of alcohol   Drug use: No     Family Hx: The patient's family history includes Bipolar disorder in her daughter and daughter; Cancer in her brother; Heart disease in her sister; Stomach cancer in her mother; Thyroid disease in her daughter and grandchild.  ROS:   Please see the history of present illness.    (+) Diarrhea (+) Insomnia (+) Intermittent chest pain (+) Chronic back pain All other systems reviewed and are negative.  Prior CV studies:   The following studies were reviewed today:  Echo 01/14/19  1. The left ventricle has normal systolic function with an ejection  fraction of 60-65%. The cavity size  was normal. Left ventricular diastolic  Doppler parameters are consistent with impaired relaxation.   2. The right ventricle has normal systolic function. The cavity was  normal.   3. The mitral valve is grossly normal. There is mild mitral annular  calcification present.   4. The tricuspid valve is grossly normal.   5. The aortic valve is tricuspid. Mild thickening of the aortic valve.  Aortic valve regurgitation is moderate by color flow Doppler. No stenosis  of the aortic valve.   6. Normal LV function; mild diastolic dysfunction; sclerotic aortic valve  with moderate AI.   Cath report from [facility] (12/09/2011) Coronary Findings: Dominance and General Appearance Right dominant with Single Vessel CAD involving the RCA Left Main Coronary Artery No significant Left Main lesions were identified Left Anterior Descending Artery No significant Left Anterior Descending Artery lesions were identified. Left Circumflex Artery No significant lesions in Circumflex Artery were identified. Right Coronary Artery Complex 95% proximal lesion in RCA Left to right collaterals were noted   Comments: Using above hardware and Angiomax for anticoagulation, lesion was crossed with fielder wire (Pro-Water and PT graphix were unable to cross). using over the wire balloon (1.25 mm) lesion in mid RCA was pre dilated with that as well as a 2 mm balloon. Then a 3 x 28 mm DES per study protocol (EVOLVE-2) was deployed by Dr. Ennis Forts. Lesion was post dilated with 3 and 3.25 mm [place] balloon resulting in 0% residual and TIMI 3 distal flow. All distal branches were accounted for. Conclusion: Diagnostic Results: Single Vessel CAD involving the RCA  Coronary Intervention Results: PCI Indication: Standard PCI Indications Procedural Success: Complete Successful PTCA/ Stenting- RCA to 0 % w/ DES  Vascular Access Management: Pulled, TR band applied to the right wrist   Had stress test here in 05/2015 but  was feeling generally well at the time, but was preop for back surgery and couldn't walk. Study Highlights    Nuclear stress EF: 65%. The left ventricular ejection fraction is normal (55-65%). No T wave inversion was noted during stress. There was no ST segment deviation noted during stress. This is a low risk study.   No reversible perfusion defects. LVEF 65%. Normal wall motion. This is a low risk study.     Labs/Other Tests and Data Reviewed:    EKG:   EKG is personally reviewed. 04/22/2022:  NSR at 66 bpm 04/12/2021: NSR at 94 bpm, PRWP 04/20/2020: EKG was not ordered. 03/12/2018: NSR, PRWP  Recent Labs: No results found for requested labs within last 365 days.   Recent Lipid Panel Lab Results  Component Value Date/Time   CHOL 137 01/13/2019 02:40 PM   TRIG 166 (H) 01/13/2019 02:40 PM   HDL 43 01/13/2019 02:40 PM   CHOLHDL 3.2 01/13/2019 02:40 PM   [place] 61 01/13/2019 02:40 PM    Objective:    Vital Signs:  BP 134/60   Pulse 66   Ht '5\' 6"'$  (1.676 m)   Wt 245 lb (111.1 kg)   BMI 39.54 kg/m    Wt Readings from Last 3 Encounters:  04/22/22 245 lb (111.1 kg)  05/08/21 244 lb 9.6 oz (110.9 kg)  04/12/21 263 lb (119.3 kg)    GEN: Well nourished, well developed in no acute distress HEENT: Normal, moist mucous membranes NECK: No JVD CARDIAC: regular rhythm, normal S1 and S2, no rubs or gallops. No murmur. VASCULAR: Radial and DP pulses 2+ bilaterally. No carotid bruits RESPIRATORY:  Clear to auscultation without rales, wheezing or rhonchi  ABDOMEN: Soft, non-tender, non-distended MUSCULOSKELETAL:  Ambulates independently SKIN: Warm and dry, no edema NEUROLOGIC:  Alert and oriented x 3. No focal neuro deficits noted. PSYCHIATRIC:  Normal affect    ASSESSMENT & PLAN:    CAD with RCA stent in 2013 Hyperlipidemia: -continue atorvastatin 40 mg daily -prior LDL is 78, goal <70. Has pending labs with upcoming physical to recheck -on aspirin 81 mg -on semaglutide,  tolerating. Diarrhea not altered with recent increase in dose from 0.5 to 1 mg weekly   Hypertension -continue losartan, metoprolol, furosemide   Type II diabetes -last A1c 7.4 -on semaglutide -continue aspirin, statin as above   Morbid obesity -working on weight loss, on semaglutide -prior bariatric surgery remotely -BMI 39 today  Follow up in 1 year or sooner as needed.  Medication Adjustments/Labs and Tests Ordered: Current medicines are reviewed at length with the patient today.  Concerns regarding medicines are outlined above.   Tests Ordered: Orders Placed This Encounter  Procedures   EKG 12-Lead   Medication Changes: No orders of the defined types were placed in this encounter.  Patient Instructions  Medication Instructions:  Your Physician recommend you continue on your current medication as directed.    *If you need a refill on your cardiac medications before your next appointment, please call your pharmacy*   Lab Work: None ordered today   Testing/Procedures: None ordered today   Follow-Up: At [facility], you and your health needs are our priority.  As part of our continuing mission to provide you with exceptional heart care, we have created designated Provider Care Teams.  These Care Teams include your primary Cardiologist (physician) and Advanced Practice Providers (APPs -  Physician Assistants and Nurse Practitioners) who all work together to provide you with the care you need, when you need it.  We recommend signing up for the patient portal called "MyChart".  Sign up information is provided on this After Visit Summary.  MyChart is used to connect with patients for Virtual Visits (Telemedicine).  Patients are able to view lab/test results, encounter notes, upcoming appointments, etc.  Non-urgent messages can be sent to your provider as well.   To learn more about what you can do with MyChart, go to [email or web address].    Your next appointment:    1 year(s)  The format for your next appointment:  In Person  Provider:   Buford Dresser, MD{       I,Mathew Stumpf,acting as a scribe for Buford Dresser, MD.,have documented all relevant documentation on the behalf of Buford Dresser, MD,as directed by  Buford Dresser, MD while in the presence of Buford Dresser, MD.  I, Buford Dresser, MD, have reviewed all documentation for this visit. The documentation on 04/22/22 for the exam, diagnosis, procedures, and orders are all accurate and complete.   Signed, Buford Dresser, MD  04/22/2022 12:20 PM    [facility]

## 2022-05-09 ENCOUNTER — Ambulatory Visit: Payer: HMO | Admitting: Podiatry

## 2022-05-21 ENCOUNTER — Telehealth: Payer: Self-pay | Admitting: *Deleted

## 2022-05-21 NOTE — Telephone Encounter (Signed)
-----   Message from Meryl Crutch, RN sent at 04/29/2022  3:02 PM EDT ----- Can you give pt a call. I tried to explain but she said she contacted [employer] and they weren't able to get her the supplies  ----- Message ----- From: Lauralee Evener, CMA Sent: 04/25/2022   1:38 PM EDT To: Meryl Crutch, RN  She needs to contact the Medical equipment place where she got it from.I have nothing to do with that. I think she got it from [facility].  ----- Message ----- From: Meryl Crutch, RN Sent: 04/22/2022  12:14 PM EDT To: Lauralee Evener, CMA  Good morning,  We saw this pt today and she stated she received her CPAP machine but it's messing a part. Do you mind calling her to find out the details and what she need to do.  Thanks

## 2022-05-21 NOTE — Telephone Encounter (Signed)
Called both phone numbers and got message they were not in service.

## 2022-09-26 DIAGNOSIS — I10 Essential (primary) hypertension: Secondary | ICD-10-CM | POA: Diagnosis not present

## 2022-09-26 DIAGNOSIS — I25110 Atherosclerotic heart disease of native coronary artery with unstable angina pectoris: Secondary | ICD-10-CM | POA: Diagnosis not present

## 2022-09-26 DIAGNOSIS — M549 Dorsalgia, unspecified: Secondary | ICD-10-CM | POA: Diagnosis not present

## 2022-09-26 DIAGNOSIS — E1165 Type 2 diabetes mellitus with hyperglycemia: Secondary | ICD-10-CM | POA: Diagnosis not present

## 2022-09-26 DIAGNOSIS — Z0000 Encounter for general adult medical examination without abnormal findings: Secondary | ICD-10-CM | POA: Diagnosis not present

## 2022-09-26 DIAGNOSIS — D692 Other nonthrombocytopenic purpura: Secondary | ICD-10-CM | POA: Diagnosis not present

## 2022-09-26 DIAGNOSIS — N1831 Chronic kidney disease, stage 3a: Secondary | ICD-10-CM | POA: Diagnosis not present

## 2022-09-26 DIAGNOSIS — Z79899 Other long term (current) drug therapy: Secondary | ICD-10-CM | POA: Diagnosis not present

## 2022-09-26 DIAGNOSIS — E039 Hypothyroidism, unspecified: Secondary | ICD-10-CM | POA: Diagnosis not present

## 2022-09-26 DIAGNOSIS — G4733 Obstructive sleep apnea (adult) (pediatric): Secondary | ICD-10-CM | POA: Diagnosis not present

## 2022-09-26 DIAGNOSIS — I252 Old myocardial infarction: Secondary | ICD-10-CM | POA: Diagnosis not present

## 2022-12-04 DIAGNOSIS — G4733 Obstructive sleep apnea (adult) (pediatric): Secondary | ICD-10-CM | POA: Diagnosis not present

## 2023-01-03 DIAGNOSIS — G4733 Obstructive sleep apnea (adult) (pediatric): Secondary | ICD-10-CM | POA: Diagnosis not present

## 2023-01-21 DIAGNOSIS — M47816 Spondylosis without myelopathy or radiculopathy, lumbar region: Secondary | ICD-10-CM | POA: Diagnosis not present

## 2023-02-03 DIAGNOSIS — G4733 Obstructive sleep apnea (adult) (pediatric): Secondary | ICD-10-CM | POA: Diagnosis not present

## 2023-02-14 DIAGNOSIS — F329 Major depressive disorder, single episode, unspecified: Secondary | ICD-10-CM | POA: Diagnosis not present

## 2023-02-14 DIAGNOSIS — Z9989 Dependence on other enabling machines and devices: Secondary | ICD-10-CM | POA: Diagnosis not present

## 2023-02-14 DIAGNOSIS — G4733 Obstructive sleep apnea (adult) (pediatric): Secondary | ICD-10-CM | POA: Diagnosis not present

## 2023-02-14 DIAGNOSIS — E1122 Type 2 diabetes mellitus with diabetic chronic kidney disease: Secondary | ICD-10-CM | POA: Diagnosis not present

## 2023-03-04 DIAGNOSIS — G4733 Obstructive sleep apnea (adult) (pediatric): Secondary | ICD-10-CM | POA: Diagnosis not present

## 2023-03-05 DIAGNOSIS — G4733 Obstructive sleep apnea (adult) (pediatric): Secondary | ICD-10-CM | POA: Diagnosis not present

## 2023-03-14 ENCOUNTER — Institutional Professional Consult (permissible substitution): Payer: Medicare HMO | Admitting: Plastic Surgery

## 2023-04-04 DIAGNOSIS — G4733 Obstructive sleep apnea (adult) (pediatric): Secondary | ICD-10-CM | POA: Diagnosis not present

## 2023-04-05 DIAGNOSIS — G4733 Obstructive sleep apnea (adult) (pediatric): Secondary | ICD-10-CM | POA: Diagnosis not present

## 2023-04-21 ENCOUNTER — Ambulatory Visit (HOSPITAL_BASED_OUTPATIENT_CLINIC_OR_DEPARTMENT_OTHER): Payer: Medicare HMO | Admitting: Cardiology

## 2023-05-05 DIAGNOSIS — G4733 Obstructive sleep apnea (adult) (pediatric): Secondary | ICD-10-CM | POA: Diagnosis not present

## 2023-05-06 DIAGNOSIS — G4733 Obstructive sleep apnea (adult) (pediatric): Secondary | ICD-10-CM | POA: Diagnosis not present

## 2023-05-16 ENCOUNTER — Ambulatory Visit (HOSPITAL_BASED_OUTPATIENT_CLINIC_OR_DEPARTMENT_OTHER): Payer: Medicare HMO | Admitting: Cardiology

## 2023-05-20 DIAGNOSIS — M4120 Other idiopathic scoliosis, site unspecified: Secondary | ICD-10-CM | POA: Diagnosis not present

## 2023-05-20 DIAGNOSIS — M47816 Spondylosis without myelopathy or radiculopathy, lumbar region: Secondary | ICD-10-CM | POA: Diagnosis not present

## 2023-06-02 DIAGNOSIS — G4733 Obstructive sleep apnea (adult) (pediatric): Secondary | ICD-10-CM | POA: Diagnosis not present

## 2023-06-05 DIAGNOSIS — G4733 Obstructive sleep apnea (adult) (pediatric): Secondary | ICD-10-CM | POA: Diagnosis not present

## 2023-07-02 DIAGNOSIS — G4733 Obstructive sleep apnea (adult) (pediatric): Secondary | ICD-10-CM | POA: Diagnosis not present

## 2023-07-06 DIAGNOSIS — G4733 Obstructive sleep apnea (adult) (pediatric): Secondary | ICD-10-CM | POA: Diagnosis not present

## 2023-08-02 DIAGNOSIS — G4733 Obstructive sleep apnea (adult) (pediatric): Secondary | ICD-10-CM | POA: Diagnosis not present

## 2023-08-05 DIAGNOSIS — G4733 Obstructive sleep apnea (adult) (pediatric): Secondary | ICD-10-CM | POA: Diagnosis not present

## 2023-08-11 ENCOUNTER — Ambulatory Visit (HOSPITAL_BASED_OUTPATIENT_CLINIC_OR_DEPARTMENT_OTHER): Payer: Medicare HMO | Admitting: Family

## 2023-08-22 ENCOUNTER — Ambulatory Visit (HOSPITAL_BASED_OUTPATIENT_CLINIC_OR_DEPARTMENT_OTHER): Payer: Medicare HMO | Admitting: Family

## 2023-09-05 DIAGNOSIS — G4733 Obstructive sleep apnea (adult) (pediatric): Secondary | ICD-10-CM | POA: Diagnosis not present

## 2023-11-04 ENCOUNTER — Ambulatory Visit (HOSPITAL_BASED_OUTPATIENT_CLINIC_OR_DEPARTMENT_OTHER): Payer: Medicare HMO | Admitting: Cardiology

## 2023-12-19 ENCOUNTER — Ambulatory Visit (HOSPITAL_BASED_OUTPATIENT_CLINIC_OR_DEPARTMENT_OTHER): Admitting: Family

## 2023-12-19 ENCOUNTER — Encounter (HOSPITAL_BASED_OUTPATIENT_CLINIC_OR_DEPARTMENT_OTHER): Payer: Self-pay | Admitting: Family

## 2023-12-19 VITALS — BP 122/48 | HR 77 | Ht 66.0 in | Wt 200.4 lb

## 2023-12-19 DIAGNOSIS — I10 Essential (primary) hypertension: Secondary | ICD-10-CM

## 2023-12-19 DIAGNOSIS — I2510 Atherosclerotic heart disease of native coronary artery without angina pectoris: Secondary | ICD-10-CM | POA: Diagnosis not present

## 2023-12-19 DIAGNOSIS — E785 Hyperlipidemia, unspecified: Secondary | ICD-10-CM

## 2023-12-19 MED ORDER — NITROGLYCERIN 0.4 MG SL SUBL: 0.4000 mg | SUBLINGUAL_TABLET | SUBLINGUAL | 2 refills | Status: AC | PRN

## 2023-12-19 NOTE — Progress Notes (Signed)
 Cardiology Office Note:  .   Date:  12/22/2023  ID:  Dawn Davidson, DOB 13-Feb-1951, MRN [id number] PCP: Verena Mems, MD  [facility] Providers Cardiologist:  Shelda Bruckner, MD    History of Present Illness: .   Dawn Davidson is a 73 y.o. female with hx of CAD s/p MI 2010 s/p DES-RCA 2013, HLD, GERD, obesity, hypothyroidism, diastolic heart failure, bariatric surgery.   Prior anginal equivalent of shortness of breath and fatigue. Last seen 04/22/22 with chest pain resolved by belching, atypical for angina.   10/08/23 GFR 63, K 4.1, LDL 77  Discussed the use of AI scribe software for clinical note transcription with the patient, who gave verbal consent to proceed.  History of Present Illness Presents with chest pain that she attributes to GERD or stress. She reports an increase in the frequency of GERD symptoms over the past two to three months. The patient denies shortness of breath and compares her current symptoms to those experienced prior to her stent placement, noting that the current symptoms are significantly less severe. The patient also reports sleep issues, stating that she only sleeps for about two hours each night. Despite these issues, the patient has made significant lifestyle changes, including losing 90 pounds through lifestyle changes and Ozempic. The patient also reports occasional diarrhea, which she attributes to her medication and a past stomach surgery. The patient also mentions a problem with her back and needing an ablation which limits her activity.   ROS: Please see the history of present illness.    All other systems reviewed and are negative.   Studies Reviewed: Dawn Davidson   EKG Interpretation Date/Time:  Friday December 19 2023 13:30:26 EDT Ventricular Rate:  62 PR Interval:  212 QRS Duration:  86 QT Interval:  418 QTC Calculation: 424 R Axis:   50  Text Interpretation: Sinus rhythm with 1st degree A-V block Low voltage QRS Confirmed by Vannie Mora ([id number]) on 12/19/2023 1:39:38 PM    Cardiac Studies & Procedures   ______________________________________________________________________________________________   STRESS TESTS  MYOCARDIAL PERFUSION IMAGING 05/31/2015  Narrative  Nuclear stress EF: 65%.  The left ventricular ejection fraction is normal (55-65%).  No T wave inversion was noted during stress.  There was no ST segment deviation noted during stress.  This is a low risk study.  No reversible perfusion defects. LVEF 65%. Normal wall motion. This is a low risk study.   ECHOCARDIOGRAM  ECHOCARDIOGRAM COMPLETE 01/14/2019  Narrative ECHOCARDIOGRAM REPORT    Patient Name:   Dawn Davidson Date of Exam: 01/14/2019 Medical Rec #:  [id number]        Height:       63.0 in Accession #:    [id number]       Weight:       268.0 lb Date of Birth:  Jan 29, 1951        BSA:          2.19 m Patient Age:    68 years         BP:           168/75 mmHg Patient Gender: F                HR:           67 bpm. Exam Location:  [address]   Procedure: 2D Echo, 3D Echo, Cardiac Doppler and Color Doppler  Indications:    R06.02 SOB  History:        Patient  has prior history of Echocardiogram examinations, most recent 04/29/2019. CAD and Previous Myocardial Infarction Risk Factors: Sleep Apnea and HLD.  Sonographer:    Waldo Guadalajara RCS Referring Phys: [phone] THOMAS A KELLY  IMPRESSIONS   1. The left ventricle has normal systolic function with an ejection fraction of 60-65%. The cavity size was normal. Left ventricular diastolic Doppler parameters are consistent with impaired relaxation. 2. The right ventricle has normal systolic function. The cavity was normal. 3. The mitral valve is grossly normal. There is mild mitral annular calcification present. 4. The tricuspid valve is grossly normal. 5. The aortic valve is tricuspid. Mild thickening of the aortic valve. Aortic valve regurgitation is moderate by color flow Doppler.  No stenosis of the aortic valve. 6. Normal LV function; mild diastolic dysfunction; sclerotic aortic valve with moderate AI.  FINDINGS Left Ventricle: The left ventricle has normal systolic function, with an ejection fraction of 60-65%. The cavity size was normal. There is no increase in left ventricular wall thickness. Left ventricular diastolic Doppler parameters are consistent with impaired relaxation.  Right Ventricle: The right ventricle has normal systolic function. The cavity was normal.  Left Atrium: Left atrial size was normal in size.  Right Atrium: Right atrial size was normal in size. Right atrial pressure is estimated at 3 mmHg.  Interatrial Septum: No atrial level shunt detected by color flow Doppler.  Pericardium: There is no evidence of pericardial effusion.  Mitral Valve: The mitral valve is grossly normal. There is mild mitral annular calcification present. Mitral valve regurgitation is trivial by color flow Doppler.  Tricuspid Valve: The tricuspid valve is grossly normal. Tricuspid valve regurgitation was not visualized by color flow Doppler.  Aortic Valve: The aortic valve is tricuspid Mild thickening of the aortic valve. Aortic valve regurgitation is moderate by color flow Doppler. There is No stenosis of the aortic valve.  Pulmonic Valve: The pulmonic valve was not well visualized. Pulmonic valve regurgitation is not visualized by color flow Doppler.  Venous: The inferior vena cava is normal in size with greater than 50% respiratory variability.   +--------------+--------++ LEFT VENTRICLE         +----------------+---------++ +--------------+--------++ Diastology                PLAX 2D                +----------------+---------++ +--------------+--------++ LV e' lateral:  8.49 cm/s LVIDd:        3.96 cm  +----------------+---------++ +--------------+--------++ LV E/e' lateral:10.7      LVIDs:        2.84 cm   +----------------+---------++ +--------------+--------++ LV e' medial:   8.38 cm/s LV PW:        0.95 cm  +----------------+---------++ +--------------+--------++ LV E/e' medial: 10.9      LV IVS:       1.10 cm  +----------------+---------++ +--------------+--------++ LVOT diam:    2.10 cm  +--------------+--------++ LV SV:        38 ml    +--------------+--------++ +-------------+------++ LV SV Index:  15.74    3D Volume EF:       +--------------+--------++ +-------------+------++ LVOT Area:    3.46 cm 3D EF:       66 %   +--------------+--------++ +-------------+------++                        LV EDV:      117 ml +--------------+--------++ +-------------+------++ LV ESV:      40 ml  +-------------+------++  LV SV:       77 ml  +-------------+------++  +---------------+----------++ RIGHT VENTRICLE           +---------------+----------++ RV Basal diam: 3.48 cm    +---------------+----------++ RV S prime:    11.30 cm/s +---------------+----------++ RVSP:          17.9 mmHg  +---------------+----------++  +---------------+-------++-----------++ LEFT ATRIUM           Index       +---------------+-------++-----------++ LA diam:       4.00 cm1.83 cm/m  +---------------+-------++-----------++ LA Vol (A2C):  73.7 ml33.66 ml/m +---------------+-------++-----------++ LA Vol (A4C):  56.6 ml25.85 ml/m +---------------+-------++-----------++ LA Biplane Vol:64.6 ml29.50 ml/m +---------------+-------++-----------++ +------------+---------++-----------++ RIGHT ATRIUM         Index       +------------+---------++-----------++ RA Pressure:3.00 mmHg            +------------+---------++-----------++ RA Area:    12.00 cm            +------------+---------++-----------++ RA Volume:  27.90 ml 12.74  ml/m +------------+---------++-----------++ +------------+-----------++ AORTIC VALVE            +------------+-----------++ LVOT Vmax:  109.00 cm/s +------------+-----------++ LVOT Vmean: 79.600 cm/s +------------+-----------++ LVOT VTI:   0.277 m     +------------+-----------++ AR PHT:     507 msec    +------------+-----------++  +-------------+-------++ AORTA                +-------------+-------++ Ao Root diam:3.20 cm +-------------+-------++  +--------------+----------++  +---------------+-----------++ MITRAL VALVE              TRICUSPID VALVE            +--------------+----------++  +---------------+-----------++ MV Area (PHT):3.48 cm    TR Peak grad:  14.9 mmHg   +--------------+----------++  +---------------+-----------++ MV Peak grad: 5.3 mmHg    TR Vmax:       193.00 cm/s +--------------+----------++  +---------------+-----------++ MV Mean grad: 2.0 mmHg    Estimated RAP: 3.00 mmHg   +--------------+----------++  +---------------+-----------++ MV Vmax:      1.15 m/s    RVSP:          17.9 mmHg   +--------------+----------++  +---------------+-----------++ MV Vmean:     60.9 cm/s  +--------------+----------++  +--------------+-------+ MV VTI:       0.31 m      SHUNTS                +--------------+----------++  +--------------+-------+ MV PHT:       63.22 msec  Systemic VTI: 0.28 m  +--------------+----------++  +--------------+-------+ MV Decel Time:218 msec    Systemic Diam:2.10 cm +--------------+----------++  +--------------+-------+ +--------------+-----------++ MV E velocity:91.20 cm/s  +--------------+-----------++ MV A velocity:120.00 cm/s +--------------+-----------++ MV E/A ratio: 0.76        +--------------+-----------++   Redell Shallow MD Electronically signed by Redell Shallow MD Signature Date/Time: 01/14/2019/11:55:40 AM    Final           ______________________________________________________________________________________________      Risk Assessment/Calculations:            Physical Exam:   VS:  BP (!) 122/48   Pulse 77   Ht 5' 6 (1.676 m)   Wt 200 lb 6.4 oz (90.9 kg)   SpO2 99%   BMI 32.35 kg/m    Wt Readings from Last 3 Encounters:  12/19/23 200 lb 6.4 oz (90.9 kg)  04/22/22 245 lb (111.1 kg)  05/08/21 244 lb 9.6 oz (110.9 kg)    Vitals:   12/19/23 1327 12/19/23 1354  BP: (!) 142/48 (!) 122/48  Pulse: 77   Height: 5' 6 (1.676 m)   Weight: 200 lb 6.4 oz (90.9 kg)   SpO2: 99%   BMI (Calculated): 32.36     GEN: Well nourished, well developed in no acute distress NECK: No JVD; No carotid bruits CARDIAC: RRR, no murmurs, rubs, gallops RESPIRATORY:  Clear to auscultation without rales, wheezing or rhonchi  ABDOMEN: Soft, non-tender, non-distended EXTREMITIES:  No edema; No deformity   ASSESSMENT AND PLAN: .    CAD /HLD, LDL goal <70 - Present chest pain when laying at night similar to previous GERD without dyspnea atypical for angina. EKG today no acute ST/T wave changes. No indication for ischemia eval. GDMT aspirin  81mg  daily, atorvastatin  40mg  daily.  Recommend aiming for 150 minutes of moderate intensity activity per week and following a heart healthy diet.    DM2 / Obesity - Continue to follow with PCP. Appreciate inclusion of Ozempic.  Refer to PREP exercise program.  HTN - Elevated initially, at goal <130/80 on repeat.  Continue Furosemide  20mg  daily, Losartan  50mg  daily. Discussed to monitor BP at home at least 2 hours after medications and sitting for 5-10 minutes.   Food insecurity - Notes often eating very small meals like toast later int he month as funds for food run out. Bag of food from [facility] food pantry provided. Referral placed to SW for additional resources.   GERD - Continue to follow with PCP.        Dispo: follow up in 1 year  Signed, Reche GORMAN Finder, NP

## 2023-12-19 NOTE — Patient Instructions (Signed)
 Medication Instructions:  Your physician recommends that you continue on your current medications as directed. Please refer to the Current Medication list given to you today.  Follow-Up: At [facility], you and your health needs are our prior

## 2023-12-22 ENCOUNTER — Telehealth (HOSPITAL_BASED_OUTPATIENT_CLINIC_OR_DEPARTMENT_OTHER): Payer: Self-pay | Admitting: Licensed Clinical Social Worker

## 2023-12-22 NOTE — Telephone Encounter (Signed)
 H&V Care Navigation CSW Progress Note  Clinical Social Worker contacted patient by phone to f/u on referral from [facility] clinic. No answer at [phone]. Voicemail not set up so unable to leave a message- will re-attempt again as able.  Patient is participating in a Managed Medicaid Plan:  No, [employer] Medicare only  SDOH Screenings   Food Insecurity: Food Insecurity Present (12/19/2023)  Tobacco Use: Low Risk  (12/19/2023)    Nathen Balder, MSW, LCSW Clinical Social Worker II [facility] Heart/Vascular Care Navigation  [phone]- work cell phone (preferred) [phone]- desk phone

## 2023-12-23 ENCOUNTER — Telehealth: Payer: Self-pay

## 2023-12-23 ENCOUNTER — Telehealth: Payer: Self-pay | Admitting: Licensed Clinical Social Worker

## 2023-12-23 NOTE — Telephone Encounter (Signed)
 H&V Care Navigation CSW Progress Note  Clinical Social Worker contacted patient by phone to f/u on referral from [facility] clinic. No answer at [phone]. Voicemail not set up so unable to leave a message again- will re-attempt again as able.    Patient is participating in a Managed Medicaid Plan:  No, [employer] Medicare  SDOH Screenings   Food Insecurity: Food Insecurity Present (12/19/2023)  Tobacco Use: Low Risk  (12/19/2023)   Nathen Balder, MSW, LCSW Clinical Social Worker II [facility] Heart/Vascular Care Navigation  [phone]- work cell phone (preferred) [phone]- desk phone

## 2023-12-23 NOTE — Telephone Encounter (Signed)
 Attempted to call X2, goes directly to message that states VM has not been activated, therefore unable to leave VM

## 2023-12-26 ENCOUNTER — Telehealth (HOSPITAL_BASED_OUTPATIENT_CLINIC_OR_DEPARTMENT_OTHER): Payer: Self-pay | Admitting: Licensed Clinical Social Worker

## 2023-12-26 NOTE — Telephone Encounter (Signed)
 H&V Care Navigation CSW Progress Note  Clinical Social Worker contacted patient by phone to f/u on referral from [facility] clinic. No again answer at [phone]. Voicemail not set up so unable to leave a message again- will mail food resources including food pantry list and [employer] card. Will also mail pt a copy of [employer] flyer as there may be Medicare programs to also help with any financial challenges if eligible. Remain available should pt return calls or reach out to clinic for further assistance.    Patient is participating in a Managed Medicaid Plan:  No, [employer] Medicare  SDOH Screenings   Food Insecurity: Food Insecurity Present (12/19/2023)  Tobacco Use: Low Risk  (12/19/2023)    Nathen Balder, MSW, LCSW Clinical Social Worker II [facility] Heart/Vascular Care Navigation  [phone]- work cell phone (preferred) [phone]- desk phone

## 2024-04-19 ENCOUNTER — Telehealth: Payer: Self-pay | Admitting: Podiatry

## 2024-04-19 NOTE — Telephone Encounter (Signed)
 I called the patient to verify and update her [place] chart but was unable to reach her. I left a voicemail requesting that she return our call to update her insurance information.

## 2024-04-26 ENCOUNTER — Ambulatory Visit (INDEPENDENT_AMBULATORY_CARE_PROVIDER_SITE_OTHER): Admitting: Podiatry

## 2024-04-26 DIAGNOSIS — Z91199 Patient's noncompliance with other medical treatment and regimen due to unspecified reason: Secondary | ICD-10-CM

## 2024-04-26 NOTE — Progress Notes (Signed)
 No show

## 2024-05-25 ENCOUNTER — Ambulatory Visit (INDEPENDENT_AMBULATORY_CARE_PROVIDER_SITE_OTHER): Payer: Self-pay | Admitting: Podiatry

## 2024-05-25 DIAGNOSIS — Z91199 Patient's noncompliance with other medical treatment and regimen due to unspecified reason: Secondary | ICD-10-CM

## 2024-05-25 NOTE — Progress Notes (Signed)
Cancel - family emergency

## 2024-07-12 ENCOUNTER — Encounter: Payer: Self-pay | Admitting: Neurology

## 2024-07-26 ENCOUNTER — Encounter: Payer: Self-pay | Admitting: Neurology

## 2024-08-09 ENCOUNTER — Ambulatory Visit: Admitting: Neurology

## 2024-09-20 ENCOUNTER — Ambulatory Visit: Admitting: Neurology

## 2024-12-01 ENCOUNTER — Ambulatory Visit: Admitting: Neurology
